# Patient Record
Sex: Male | Born: 1945 | Race: White | Hispanic: No | Marital: Married | Smoking: Never smoker
Health system: Southern US, Community
[De-identification: ages and names within clinical notes are randomized; demographics above are authoritative.]

## PROBLEM LIST (undated history)

## (undated) DIAGNOSIS — K219 Gastro-esophageal reflux disease without esophagitis: Secondary | ICD-10-CM

## (undated) DIAGNOSIS — C8590 Non-Hodgkin lymphoma, unspecified, unspecified site: Secondary | ICD-10-CM

## (undated) DIAGNOSIS — G629 Polyneuropathy, unspecified: Secondary | ICD-10-CM

## (undated) DIAGNOSIS — IMO0001 Reserved for inherently not codable concepts without codable children: Secondary | ICD-10-CM

## (undated) DIAGNOSIS — Z860100 Personal history of colon polyps, unspecified: Secondary | ICD-10-CM

## (undated) DIAGNOSIS — N2889 Other specified disorders of kidney and ureter: Secondary | ICD-10-CM

## (undated) DIAGNOSIS — R112 Nausea with vomiting, unspecified: Secondary | ICD-10-CM

## (undated) DIAGNOSIS — Z8719 Personal history of other diseases of the digestive system: Secondary | ICD-10-CM

## (undated) DIAGNOSIS — Z9889 Other specified postprocedural states: Secondary | ICD-10-CM

## (undated) DIAGNOSIS — D649 Anemia, unspecified: Secondary | ICD-10-CM

## (undated) DIAGNOSIS — C801 Malignant (primary) neoplasm, unspecified: Secondary | ICD-10-CM

## (undated) DIAGNOSIS — N189 Chronic kidney disease, unspecified: Secondary | ICD-10-CM

## (undated) DIAGNOSIS — I2510 Atherosclerotic heart disease of native coronary artery without angina pectoris: Secondary | ICD-10-CM

## (undated) HISTORY — DX: Polyneuropathy, unspecified: G62.9

## (undated) HISTORY — PX: CHOLECYSTECTOMY: SHX55

## (undated) HISTORY — DX: Malignant (primary) neoplasm, unspecified: C80.1

## (undated) HISTORY — PX: APPENDECTOMY: SHX54

## (undated) HISTORY — DX: Gastro-esophageal reflux disease without esophagitis: K21.9

## (undated) HISTORY — PX: FRACTURE SURGERY: SHX138

## (undated) HISTORY — PX: GALLBLADDER SURGERY: SHX652

## (undated) HISTORY — PX: HERNIA REPAIR: SHX51

## (undated) HISTORY — PX: TONSILLECTOMY: SUR1361

## (undated) HISTORY — DX: Non-Hodgkin lymphoma, unspecified, unspecified site: C85.90

## (undated) HISTORY — PX: BONE MARROW TRANSPLANT: SHX200

## (undated) HISTORY — DX: Reserved for inherently not codable concepts without codable children: IMO0001

## (undated) HISTORY — DX: Anemia, unspecified: D64.9

---

## 2004-06-11 ENCOUNTER — Ambulatory Visit: Payer: Self-pay | Admitting: Internal Medicine

## 2008-09-27 ENCOUNTER — Ambulatory Visit: Payer: Self-pay | Admitting: Internal Medicine

## 2008-10-08 ENCOUNTER — Ambulatory Visit: Payer: Self-pay | Admitting: Unknown Physician Specialty

## 2009-11-19 ENCOUNTER — Ambulatory Visit: Payer: Self-pay | Admitting: Unknown Physician Specialty

## 2010-10-27 ENCOUNTER — Ambulatory Visit: Payer: Self-pay | Admitting: Unknown Physician Specialty

## 2010-11-27 ENCOUNTER — Ambulatory Visit: Payer: Self-pay | Admitting: Unknown Physician Specialty

## 2010-12-03 ENCOUNTER — Ambulatory Visit: Payer: Self-pay | Admitting: Unknown Physician Specialty

## 2013-06-14 ENCOUNTER — Ambulatory Visit: Payer: Self-pay | Admitting: General Practice

## 2013-09-04 ENCOUNTER — Ambulatory Visit: Payer: Self-pay | Admitting: Internal Medicine

## 2013-12-14 ENCOUNTER — Encounter: Payer: Self-pay | Admitting: Podiatry

## 2013-12-14 ENCOUNTER — Ambulatory Visit (INDEPENDENT_AMBULATORY_CARE_PROVIDER_SITE_OTHER): Payer: Medicare HMO | Admitting: Podiatry

## 2013-12-14 ENCOUNTER — Ambulatory Visit (INDEPENDENT_AMBULATORY_CARE_PROVIDER_SITE_OTHER): Payer: Medicare HMO

## 2013-12-14 VITALS — BP 121/71 | HR 80 | Resp 16 | Ht 69.0 in | Wt 195.0 lb

## 2013-12-14 DIAGNOSIS — M779 Enthesopathy, unspecified: Secondary | ICD-10-CM

## 2013-12-14 DIAGNOSIS — M2011 Hallux valgus (acquired), right foot: Secondary | ICD-10-CM

## 2013-12-14 DIAGNOSIS — M21611 Bunion of right foot: Secondary | ICD-10-CM

## 2013-12-14 DIAGNOSIS — L84 Corns and callosities: Secondary | ICD-10-CM

## 2013-12-14 MED ORDER — TRIAMCINOLONE ACETONIDE 10 MG/ML IJ SUSP
10.0000 mg | Freq: Once | INTRAMUSCULAR | Status: AC
Start: 2013-12-14 — End: 2013-12-14
  Administered 2013-12-14: 10 mg

## 2013-12-14 NOTE — Progress Notes (Signed)
   Subjective:    Patient ID: Raymond Hock., male    DOB: 1946/03/01, 68 y.o.   MRN: [id number]  HPI Comments: About 5-6 weeks ago my right foot the little toe down to the middle of foot started to hurt. Right 5th met lateral side of foot. Kind of sharp it has me limping .      Review of Systems  Gastrointestinal: Positive for diarrhea.  All other systems reviewed and are negative.      Objective:   Physical Exam        Assessment & Plan:

## 2013-12-14 NOTE — Progress Notes (Signed)
Subjective:     Patient ID: Raymond Bennett., male   DOB: 1945-08-30, 68 y.o.   MRN: [id number]   Foot Pain   patient points the outside of the right foot states it's been hurting in the base of the bone and now has developed a second pain on my right fifth toe which has fluid in it and a lesion that I cannot take care of myself. States he likes to walk and it's been hard to do   Review of Systems  All other systems reviewed and are negative.      Objective:   Physical Exam  Nursing note and vitals reviewed. Constitutional: He is oriented to person, place, and time.  Cardiovascular: Intact distal pulses.   Musculoskeletal: Normal range of motion.  Neurological: He is oriented to person, place, and time.  Skin: Skin is warm.   vascular status found to be intact with neurological diminished both sharp dull and vibratory and muscle strength is adequate with range of motion subtalar and midtarsal joint within normal limits. Patient is noted to have quite a bit of discomfort at the base of the fifth metatarsal right with inflammation and fluid buildup and has a interphalangeal fluid lesion right fifth toe with keratotic lesion over-the-top it's very painful when pressed. Digits are well-perfused and patient is well oriented x3     Assessment:     Hammertoe deformity with inflamed capsule and tendinitis right fifth MPJ base    Plan:     H&P performed and conditions discussed. Careful injection of the tendon base fifth metatarsal 3 mg Kenalog 5 mg Xylocaine was accomplished and I noted the fifth toe and then did a plantar capsule or inflammatory injection  2 mg Kenalog 2 mg Xylocaine and did full debridement of lesion. Dispensed fascially brace with all instructions on usage

## 2013-12-21 ENCOUNTER — Ambulatory Visit (INDEPENDENT_AMBULATORY_CARE_PROVIDER_SITE_OTHER): Payer: Medicare HMO | Admitting: Podiatry

## 2013-12-21 VITALS — BP 112/63 | HR 76 | Resp 16

## 2013-12-21 DIAGNOSIS — M21611 Bunion of right foot: Secondary | ICD-10-CM

## 2013-12-21 DIAGNOSIS — M779 Enthesopathy, unspecified: Secondary | ICD-10-CM

## 2013-12-21 DIAGNOSIS — M2011 Hallux valgus (acquired), right foot: Secondary | ICD-10-CM

## 2013-12-21 NOTE — Progress Notes (Signed)
Subjective:     Patient ID: Raymond Hock., male   DOB: [birth year], 68 y.o.   MRN: [id number]  HPI patient presents stating my foot feels a lot better and I'm having minimal discomfort   Review of Systems     Objective:   Physical Exam Neurovascular status intact with significant diminishment of discomfort base of fifth metatarsal right and also keratotic lesion fifth toe right that has improved with treatment    Assessment:     Improved tendinitis and hammertoe deformity fifth right    Plan:     At this point discussed physical therapy for area debridement of lesion padding and supportive shoes. Reappoint if symptoms were to recur or other issues should occur

## 2014-01-17 DIAGNOSIS — C4491 Basal cell carcinoma of skin, unspecified: Secondary | ICD-10-CM

## 2014-01-17 HISTORY — DX: Basal cell carcinoma of skin, unspecified: C44.91

## 2015-07-25 ENCOUNTER — Other Ambulatory Visit: Payer: Self-pay | Admitting: Internal Medicine

## 2015-07-25 ENCOUNTER — Ambulatory Visit
Admission: RE | Admit: 2015-07-25 | Discharge: 2015-07-25 | Disposition: A | Discharge status: Home / Self Care | Payer: Medicare Other | Source: Ambulatory Visit | Attending: Internal Medicine | Admitting: Internal Medicine

## 2015-07-25 DIAGNOSIS — Z8572 Personal history of non-Hodgkin lymphomas: Secondary | ICD-10-CM

## 2015-07-30 ENCOUNTER — Ambulatory Visit
Admission: RE | Admit: 2015-07-30 | Discharge: 2015-07-30 | Disposition: A | Discharge status: Home / Self Care | Payer: Medicare Other | Source: Ambulatory Visit | Attending: Internal Medicine | Admitting: Internal Medicine

## 2015-07-30 ENCOUNTER — Other Ambulatory Visit
Admission: RE | Admit: 2015-07-30 | Discharge: 2015-07-30 | Disposition: A | Discharge status: Home / Self Care | Payer: Medicare Other | Source: Ambulatory Visit | Attending: Internal Medicine | Admitting: Internal Medicine

## 2015-07-30 DIAGNOSIS — I700 Atherosclerosis of aorta: Secondary | ICD-10-CM | POA: Insufficient documentation

## 2015-07-30 DIAGNOSIS — Z8572 Personal history of non-Hodgkin lymphomas: Secondary | ICD-10-CM | POA: Diagnosis not present

## 2015-07-30 DIAGNOSIS — K7689 Other specified diseases of liver: Secondary | ICD-10-CM | POA: Diagnosis not present

## 2015-07-30 DIAGNOSIS — K760 Fatty (change of) liver, not elsewhere classified: Secondary | ICD-10-CM | POA: Diagnosis not present

## 2015-07-30 DIAGNOSIS — Z01812 Encounter for preprocedural laboratory examination: Secondary | ICD-10-CM | POA: Diagnosis present

## 2015-07-30 LAB — CREATININE, SERUM
Creatinine, Ser: 1.3 mg/dL — ABNORMAL HIGH (ref 0.61–1.24)
GFR, EST NON AFRICAN AMERICAN: 54 mL/min — AB (ref >60)

## 2015-07-30 MED ORDER — IOPAMIDOL (ISOVUE-300) INJECTION 61%
100.0000 mL | Freq: Once | INTRAVENOUS | Status: AC | PRN
  Administered 2015-07-30: 85 mL via INTRAVENOUS

## 2015-08-06 ENCOUNTER — Encounter: Payer: Self-pay | Admitting: Urology

## 2015-08-06 ENCOUNTER — Ambulatory Visit (INDEPENDENT_AMBULATORY_CARE_PROVIDER_SITE_OTHER): Payer: Medicare Other | Admitting: Urology

## 2015-08-06 VITALS — BP 116/72 | HR 77 | Ht 68.0 in | Wt 203.4 lb

## 2015-08-06 DIAGNOSIS — N2889 Other specified disorders of kidney and ureter: Secondary | ICD-10-CM | POA: Diagnosis not present

## 2015-08-06 NOTE — Progress Notes (Signed)
08/06/2015 11:44 AM   Raymond Bennett 01/17/1946 [id number]  Referring provider: Albina Billet, MD [address]   [place], [place] [address]  Chief Complaint  Patient presents with  . New Patient (Initial Visit)    CT scan     HPI: The patient is a 70 year old gentleman with a past medical history of lymphoma who presents for evaluation of a renal mass.  It is located on the left lower pole of his kidney. His 14 mm in size. Compared to previous imaging, it was 6 mm in 2012. The patient has no urological plates this time. He is no history of gross hematuria or difficulty with urination.  Regards to his lymphoma history, he was diagnosed in 1993 and received chemotherapy at that time. He had recurrence in 1995 and underwent a stem cell transplant at [facility]. He's had no evidence of disease since that time.   PMH: Past Medical History  Diagnosis Date  . Reflux   . Neuropathy ([place])   . Cancer ([place])   . Non Hodgkin's lymphoma [facility])     Surgical History: Past Surgical History  Procedure Laterality Date  . Gallbladder surgery    . Fracture surgery    . Bone marrow transplant      Home Medications:    Medication List       This list is accurate as of: 08/06/15 11:44 AM.  Always use your most recent med list.               cholecalciferol 1000 units tablet  Commonly known as:  VITAMIN D  Take 1,000 Units by mouth daily.     FLUOCINOLONE ACETONIDE BODY 0.01 % Oil     magnesium oxide 400 MG tablet  Commonly known as:  MAG-OX  Take 400 mg by mouth daily.     omeprazole 40 MG capsule  Commonly known as:  PRILOSEC  Reported on 08/06/2015     PROBIOTIC + OMEGA-3 Caps  Take by mouth.     triamcinolone cream 0.1 %  Commonly known as:  KENALOG     vitamin B-12 1000 MCG tablet  Commonly known as:  CYANOCOBALAMIN  Take 1,000 mcg by mouth daily.        Allergies: No Known Allergies  Family History: No family history on file.  Social History:  reports that  he has never smoked. He has never used smokeless tobacco. His alcohol and drug histories are not on file.  ROS: UROLOGY Frequent Urination?: No Hard to postpone urination?: No Burning/pain with urination?: No Get up at night to urinate?: Yes Leakage of urine?: No Urine stream starts and stops?: No Trouble starting stream?: No Do you have to strain to urinate?: No Blood in urine?: No Urinary tract infection?: No Sexually transmitted disease?: No Injury to kidneys or bladder?: No Painful intercourse?: No Weak stream?: No Erection problems?: No Penile pain?: No  Gastrointestinal Nausea?: No Vomiting?: No Indigestion/heartburn?: No Diarrhea?: No Constipation?: No  Constitutional Fever: No Night sweats?: No Weight loss?: No Fatigue?: No  Skin Skin rash/lesions?: Yes Itching?: Yes  Eyes Blurred vision?: No Double vision?: No  Ears/Nose/Throat Sore throat?: No Sinus problems?: No  Hematologic/Lymphatic Swollen glands?: No Easy bruising?: No  Cardiovascular Leg swelling?: No Chest pain?: No  Respiratory Cough?: No Shortness of breath?: No  Endocrine Excessive thirst?: No  Musculoskeletal Back pain?: No Joint pain?: No  Neurological Headaches?: No Dizziness?: No  Psychologic Depression?: No Anxiety?: No  Physical Exam:  BP 116/72 mmHg  Pulse 77  Ht 5\' 8"  (1.727 m)  Wt 203 lb 6.4 oz (92.262 kg)  BMI 30.93 kg/m2  Constitutional:  Alert and oriented, No acute distress. HEENT: [place] AT, moist mucus membranes.  Trachea midline, no masses. Cardiovascular: No clubbing, cyanosis, or edema. Respiratory: Normal respiratory effort, no increased work of breathing. GI: Abdomen is soft, nontender, nondistended, no abdominal masses GU: No CVA tenderness.  Skin: No rashes, bruises or suspicious lesions. Lymph: No cervical or inguinal adenopathy. Neurologic: Grossly intact, no focal deficits, moving all 4 extremities. Psychiatric: Normal mood and  affect.  Laboratory Data: No results found for: WBC, HGB, HCT, MCV, PLT  Lab Results  Component Value Date   CREATININE 1.30* 07/30/2015    No results found for: PSA  No results found for: TESTOSTERONE  No results found for: HGBA1C  Urinalysis No results found for: COLORURINE, APPEARANCEUR, LABSPEC, PHURINE, GLUCOSEU, HGBUR, BILIRUBINUR, KETONESUR, PROTEINUR, UROBILINOGEN, NITRITE, LEUKOCYTESUR  Pertinent Imaging: CLINICAL DATA: Skin rashes since February 2016. Personal history of non-Hodgkin's lymphoma 20 years ago.  EXAM: CT ABDOMEN AND PELVIS WITH CONTRAST  TECHNIQUE: Multidetector CT imaging of the abdomen and pelvis was performed using the standard protocol following bolus administration of intravenous contrast.  CONTRAST: 40mL ISOVUE-300 IOPAMIDOL (ISOVUE-300) INJECTION 61%  COMPARISON: 12/03/2010  FINDINGS: Lung bases are clear. No pleural or pericardial fluid.  There is diffuse fatty change of the liver. 1 cm cyst at the inferior margin of the lateral segment of the left lobe is unchanged. Previous cholecystectomy. The spleen is normal. The pancreas is normal. The adrenal glands are normal. The right kidney is normal. The left kidney is normal except for a 6 mm cyst in the upper pole and a 14 mm abnormality at the lower pole. This can be seen in retrospect in 2012 when it measured 6 mm. This is indeterminate but could be a neoplasm. The aorta and its branch vessels show atherosclerosis. No aneurysm. The IVC is normal. No retroperitoneal mass or lymphadenopathy. No free intraperitoneal fluid or air. Previous abdominal wall surgery. Bladder, prostate gland and seminal vesicles are unremarkable. No bowel pathology is seen. There are ordinary degenerative changes of the lower lumbar spine.  IMPRESSION: No evidence of recurrent lymphoma.  Chronic fatty change of the liver. 1 cm cyst in the left lobe.  Atherosclerosis of the aorta and its  branch vessels.  14 mm abnormality at the lower pole of the kidney on the left which could represent a mass. Because this is enlarging since the study of 2012, where it is visible only in retrospect as a 6 mm focus, MRI with and without contrast is recommended.   Assessment & Plan:    1. 14 mm left renal mass I discussed the patient that his current CT with contrast is indeterminate as to whether or not his renal masses and malignancy. We did discuss that if it is a malignancy, it is growing quite slowly from when it was first seen in 2012. He also has a history of lymphoma, so this cannot be ruled out as the etiology either. We discussed the next best step would be to obtain an MRI Renal Protocol in order to better visualize this mass. We'll plan to proceed with the MRI. We will follow-up after the study has been performed to discuss the results.  Return in about 2 weeks (around 08/20/2015) for after MRI .  [name], MD  [facility] [address], [place], [place] [address] [phone]   

## 2015-08-26 ENCOUNTER — Ambulatory Visit
Admission: RE | Admit: 2015-08-26 | Discharge: 2015-08-26 | Disposition: A | Discharge status: Home / Self Care | Payer: Medicare Other | Source: Ambulatory Visit | Attending: Urology | Admitting: Urology

## 2015-08-26 DIAGNOSIS — K760 Fatty (change of) liver, not elsewhere classified: Secondary | ICD-10-CM | POA: Insufficient documentation

## 2015-08-26 DIAGNOSIS — N2889 Other specified disorders of kidney and ureter: Secondary | ICD-10-CM | POA: Insufficient documentation

## 2015-08-26 MED ORDER — GADOBENATE DIMEGLUMINE 529 MG/ML IV SOLN
20.0000 mL | Freq: Once | INTRAVENOUS | Status: AC | PRN
  Administered 2015-08-26: 19 mL via INTRAVENOUS

## 2015-09-03 ENCOUNTER — Ambulatory Visit: Payer: Medicare Other

## 2015-09-08 ENCOUNTER — Ambulatory Visit: Payer: Medicare Other

## 2015-09-10 ENCOUNTER — Ambulatory Visit: Payer: Medicare Other | Admitting: Urology

## 2015-10-02 ENCOUNTER — Ambulatory Visit (INDEPENDENT_AMBULATORY_CARE_PROVIDER_SITE_OTHER): Payer: Medicare Other | Admitting: Urology

## 2015-10-02 ENCOUNTER — Encounter: Payer: Self-pay | Admitting: Urology

## 2015-10-02 VITALS — BP 110/75 | HR 92 | Ht 68.0 in | Wt 201.6 lb

## 2015-10-02 DIAGNOSIS — N2889 Other specified disorders of kidney and ureter: Secondary | ICD-10-CM

## 2015-10-02 NOTE — Progress Notes (Signed)
10/02/2015 10:21 AM   Raymond Bennett 1945-08-11 [id number]  Referring provider: Albina Billet, MD [address]   [place], [place] [address]  Chief Complaint  Patient presents with  . Follow-up    MRI results    HPI: The patient is a 70 year old male who presents for follow-up of his MRI. Unfortunately shows a 1 cm left lower pole mass consistent with renal cell carcinoma. He denies any other changes to his history since this was seen.  Of note, he has a history of lymphoma that has been successfully treated with bone marrow transplant. He also has had a bilateral hernias, cholecystectomy, and a umbilical hernia repair that was performed open.   PMH: Past Medical History:  Diagnosis Date  . Cancer ([place])   . Neuropathy ([place])   . Non Hodgkin's lymphoma ([place])   . Reflux     Surgical History: Past Surgical History:  Procedure Laterality Date  . BONE MARROW TRANSPLANT    . FRACTURE SURGERY    . GALLBLADDER SURGERY      Home Medications:    Medication List       Accurate as of 10/02/15 10:21 AM. Always use your most recent med list.          cholecalciferol 1000 units tablet Commonly known as:  VITAMIN D Take 1,000 Units by mouth daily.   FLUOCINOLONE ACETONIDE BODY 0.01 % Oil   magnesium oxide 400 MG tablet Commonly known as:  MAG-OX Take 400 mg by mouth daily.   omeprazole 40 MG capsule Commonly known as:  PRILOSEC Reported on 08/06/2015   PROBIOTIC + OMEGA-3 Caps Take by mouth.   triamcinolone cream 0.1 % Commonly known as:  KENALOG   vitamin B-12 1000 MCG tablet Commonly known as:  CYANOCOBALAMIN Take 1,000 mcg by mouth daily.       Allergies: No Known Allergies  Family History: No family history on file.  Social History:  reports that he has never smoked. He has never used smokeless tobacco. His alcohol and drug histories are not on file.  ROS: UROLOGY Frequent Urination?: No Hard to postpone urination?: No Burning/pain with  urination?: No Get up at night to urinate?: No Leakage of urine?: No Urine stream starts and stops?: No Trouble starting stream?: No Do you have to strain to urinate?: No Blood in urine?: No Urinary tract infection?: No Sexually transmitted disease?: No Injury to kidneys or bladder?: No Painful intercourse?: No Weak stream?: No Erection problems?: No Penile pain?: No  Gastrointestinal Nausea?: No Vomiting?: No Indigestion/heartburn?: No Diarrhea?: No Constipation?: No  Constitutional Fever: No Night sweats?: No Weight loss?: No Fatigue?: No  Skin Skin rash/lesions?: Yes Itching?: Yes  Eyes Blurred vision?: No Double vision?: No  Ears/Nose/Throat Sore throat?: No Sinus problems?: No  Hematologic/Lymphatic Swollen glands?: No Easy bruising?: No  Cardiovascular Leg swelling?: No Chest pain?: No  Respiratory Cough?: No Shortness of breath?: No  Endocrine Excessive thirst?: No  Musculoskeletal Back pain?: No Joint pain?: No  Neurological Headaches?: No Dizziness?: No  Psychologic Depression?: No Anxiety?: No  Physical Exam: BP 110/75 (BP Location: Left Arm, Patient Position: Sitting, Cuff Size: Large)   Pulse 92   Ht 5\' 8"  (1.727 m)   Wt 201 lb 9.6 oz (91.4 kg)   BMI 30.65 kg/m   Constitutional:  Alert and oriented, No acute distress. HEENT: [place] AT, moist mucus membranes.  Trachea midline, no masses. Cardiovascular: No clubbing, cyanosis, or edema. Respiratory: Normal respiratory effort,  no increased work of breathing. GI: Abdomen is soft, nontender, nondistended, no abdominal masses GU: No CVA tenderness.  Skin: No rashes, bruises or suspicious lesions. Lymph: No cervical or inguinal adenopathy. Neurologic: Grossly intact, no focal deficits, moving all 4 extremities. Psychiatric: Normal mood and affect.  Laboratory Data: No results found for: WBC, HGB, HCT, MCV, PLT  Lab Results  Component Value Date   CREATININE 1.30 (H) 07/30/2015     No results found for: PSA  No results found for: TESTOSTERONE  No results found for: HGBA1C  Urinalysis No results found for: COLORURINE, APPEARANCEUR, LABSPEC, PHURINE, GLUCOSEU, HGBUR, BILIRUBINUR, KETONESUR, PROTEINUR, UROBILINOGEN, NITRITE, LEUKOCYTESUR  Pertinent Imaging: CLINICAL DATA:  Indeterminate lower left renal mass on recent CT study.  EXAM: MRI ABDOMEN WITHOUT AND WITH CONTRAST  TECHNIQUE: Multiplanar multisequence MR imaging of the abdomen was performed both before and after the administration of intravenous contrast.  CONTRAST:  3mL MULTIHANCE GADOBENATE DIMEGLUMINE 529 MG/ML IV SOLN  COMPARISON:  07/30/2015 CT abdomen/ pelvis.  FINDINGS: Lower chest: Clear lung bases.  Hepatobiliary: Normal liver size and configuration. Moderate diffuse hepatic steatosis. No definite liver surface irregularity. Too small simple liver cysts, largest 1.2 cm in the segment 3 left liver lobe. Cholecystectomy. No intrahepatic biliary ductal dilatation. Common bile duct diameter 6 mm, within expected post cholecystectomy limits. No choledocholithiasis. No biliary stricture. No biliary or ampullary mass.  Pancreas: No pancreatic mass or duct dilation.  No pancreas divisum.  Spleen: Normal size. No mass.  Adrenals/Urinary Tract: Normal adrenals. No hydronephrosis. Simple 0.8 cm renal cyst in the lateral upper left kidney. There is an avidly enhancing 1.3 x 1.3 x 1.5 cm renal cortical mass in the medial lower left kidney (series 12/image 40), which was not present on the 09/27/2008 CT study and which increased in retrospect from 0.7 x 0.7 cm on 12/03/2010 CT study, most consistent with renal cell carcinoma.  Stomach/Bowel: Grossly normal stomach. Visualized small and large bowel is normal caliber, with no bowel wall thickening.  Vascular/Lymphatic: Normal caliber abdominal aorta. Patent portal, splenic, hepatic and renal veins. No pathologically enlarged  lymph nodes in the abdomen.  Other: No abdominal ascites or focal fluid collection.  Musculoskeletal: No aggressive appearing focal osseous lesions.  IMPRESSION: 1. Avidly enhancing 1.3 x 1.3 x 1.5 cm renal cortical mass in the medial lower left kidney, most consistent with renal cell carcinoma. Patent renal veins without tumor thrombus. 2. No lymphadenopathy or other findings of metastatic disease in the abdomen. 3. Moderate diffuse hepatic steatosis.  Assessment & Plan:    1. Renal mass I had a long discussion with those approximately 45 minutes with the patient regarding treatment options. These include active surveillance, partial nephrectomy robotically, and cryoablation. We specifically spent most the time discussing robotic partial nephrectomy. He understands the goal would be to remove just the tumor. He understands this may be somewhat difficult given his multiple previous abdominal surgeries. He understands the risks also include but are not limited to bleeding, infection, having to perform a radical nephrectomy, severe blood loss, damage to surrounding structures, ureteral injury, DVT, anesthetic complications, and death. He understands these risks. He also understands that he will the Foley catheter draining surgery. In his wife elected to discuss procedure amongst themselves. He is undergoing a robotic partial left nephrectomy. He'll follow-up in 2-3 weeks to discuss this further.   Return in about 3 weeks (around 10/23/2015).  [name], MD  [facility] [address], [place], [place] [address] [phone]  

## 2015-10-07 ENCOUNTER — Telehealth: Payer: Self-pay | Admitting: Radiology

## 2015-10-07 NOTE — Telephone Encounter (Signed)
Notified pt of surgery scheduled with Dr Pilar Jarvis & Dr Erlene Quan on 11/12/15, pre-admit testing appt on 10/30/15 @10 :00 & to call day prior to surgery for arrival time to SDS. Pt voices understanding.

## 2015-10-30 ENCOUNTER — Encounter
Admission: RE | Admit: 2015-10-30 | Discharge: 2015-10-30 | Disposition: A | Discharge status: Home / Self Care | Payer: Medicare Other | Source: Ambulatory Visit | Attending: Urology | Admitting: Urology

## 2015-10-30 ENCOUNTER — Other Ambulatory Visit: Payer: Self-pay | Admitting: Radiology

## 2015-10-30 ENCOUNTER — Encounter: Payer: Self-pay | Admitting: Urology

## 2015-10-30 ENCOUNTER — Ambulatory Visit (INDEPENDENT_AMBULATORY_CARE_PROVIDER_SITE_OTHER): Payer: Medicare Other | Admitting: Urology

## 2015-10-30 VITALS — BP 118/78 | HR 114 | Ht 68.0 in | Wt 201.0 lb

## 2015-10-30 DIAGNOSIS — C649 Malignant neoplasm of unspecified kidney, except renal pelvis: Secondary | ICD-10-CM | POA: Diagnosis not present

## 2015-10-30 DIAGNOSIS — Z01810 Encounter for preprocedural cardiovascular examination: Secondary | ICD-10-CM | POA: Insufficient documentation

## 2015-10-30 DIAGNOSIS — N2889 Other specified disorders of kidney and ureter: Secondary | ICD-10-CM

## 2015-10-30 DIAGNOSIS — Z01812 Encounter for preprocedural laboratory examination: Secondary | ICD-10-CM | POA: Insufficient documentation

## 2015-10-30 HISTORY — DX: Nausea with vomiting, unspecified: Z98.890

## 2015-10-30 HISTORY — DX: Chronic kidney disease, unspecified: N18.9

## 2015-10-30 HISTORY — DX: Personal history of other diseases of the digestive system: Z87.19

## 2015-10-30 HISTORY — DX: Nausea with vomiting, unspecified: R11.2

## 2015-10-30 HISTORY — DX: Gastro-esophageal reflux disease without esophagitis: K21.9

## 2015-10-30 LAB — BASIC METABOLIC PANEL WITH GFR
Anion gap: 4 — ABNORMAL LOW (ref 5–15)
BUN: 17 mg/dL (ref 6–20)
CO2: 32 mmol/L (ref 22–32)
Calcium: 9.6 mg/dL (ref 8.9–10.3)
Chloride: 104 mmol/L (ref 101–111)
Creatinine, Ser: 1.2 mg/dL (ref 0.61–1.24)
GFR calc Af Amer: >60 mL/min
GFR calc non Af Amer: 59 mL/min — ABNORMAL LOW
Glucose, Bld: 79 mg/dL (ref 65–99)
Potassium: 3.7 mmol/L (ref 3.5–5.1)
Sodium: 140 mmol/L (ref 135–145)

## 2015-10-30 LAB — CBC
HEMATOCRIT: 41.8 % (ref 40.0–52.0)
HEMOGLOBIN: 14.5 g/dL (ref 13.0–18.0)
MCH: 34.1 pg — AB (ref 26.0–34.0)
MCHC: 34.7 g/dL (ref 32.0–36.0)
MCV: 98.4 fL (ref 80.0–100.0)
Platelets: 185 10*3/uL (ref 150–440)
RBC: 4.25 MIL/uL — AB (ref 4.40–5.90)
RDW: 15.5 % — ABNORMAL HIGH (ref 11.5–14.5)
WBC: 11.2 10*3/uL — ABNORMAL HIGH (ref 3.8–10.6)

## 2015-10-30 LAB — PROTIME-INR
INR: 0.99
PROTHROMBIN TIME: 13.1 s (ref 11.4–15.2)

## 2015-10-30 LAB — APTT: aPTT: 30 s (ref 24–36)

## 2015-10-30 NOTE — Progress Notes (Signed)
10/30/2015 9:38 AM   Raymond Bennett [birth year] [id number]  Referring provider: Albina Billet, MD [address]   [place], [place] [address]  Chief Complaint  Patient presents with  . Pre-op Exam    Renal Mass    HPI: The patient is here to further discuss his 1 cm lower pole left renal mass is concerning for RCC. He presents today for further discussions regarding surgical management. He has no new complaints at this time.   PMH: Past Medical History:  Diagnosis Date  . Cancer ([place])   . Neuropathy ([place])   . Non Hodgkin's lymphoma ([place])   . Reflux     Surgical History: Past Surgical History:  Procedure Laterality Date  . BONE MARROW TRANSPLANT    . FRACTURE SURGERY    . GALLBLADDER SURGERY      Home Medications:    Medication List       Accurate as of 10/30/15  9:38 AM. Always use your most recent med list.          cholecalciferol 1000 units tablet Commonly known as:  VITAMIN D Take 1,000 Units by mouth daily.   FLUOCINOLONE ACETONIDE BODY 0.01 % Oil   IRON-150 PO Take by mouth.   magnesium oxide 400 MG tablet Commonly known as:  MAG-OX Take 400 mg by mouth daily.   omeprazole 40 MG capsule Commonly known as:  PRILOSEC Reported on 08/06/2015   PROBIOTIC + OMEGA-3 Caps Take by mouth.   triamcinolone cream 0.1 % Commonly known as:  KENALOG   vitamin B-12 1000 MCG tablet Commonly known as:  CYANOCOBALAMIN Take 1,000 mcg by mouth daily.       Allergies: No Known Allergies  Family History: History reviewed. No pertinent family history.  Social History:  reports that he has never smoked. He has never used smokeless tobacco. His alcohol and drug histories are not on file.  ROS: UROLOGY Frequent Urination?: No Hard to postpone urination?: No Burning/pain with urination?: No Get up at night to urinate?: No Leakage of urine?: No Urine stream starts and stops?: No Trouble starting stream?: No Do you have to strain to urinate?:  No Blood in urine?: No Urinary tract infection?: No Sexually transmitted disease?: No Injury to kidneys or bladder?: No Painful intercourse?: No Weak stream?: No Erection problems?: No Penile pain?: No  Gastrointestinal Nausea?: No Vomiting?: No Indigestion/heartburn?: No Diarrhea?: No Constipation?: No  Constitutional Fever: No Night sweats?: No Weight loss?: No Fatigue?: No  Skin Skin rash/lesions?: No Itching?: Yes  Eyes Blurred vision?: No Double vision?: No  Ears/Nose/Throat Sore throat?: No Sinus problems?: No  Hematologic/Lymphatic Swollen glands?: No Easy bruising?: No  Cardiovascular Leg swelling?: No Chest pain?: No  Respiratory Cough?: No Shortness of breath?: No  Endocrine Excessive thirst?: No  Musculoskeletal Back pain?: No Joint pain?: No  Neurological Headaches?: No Dizziness?: No  Psychologic Depression?: No Anxiety?: No  Physical Exam: BP 118/78   Pulse (!) 114   Ht 5\' 8"  (1.727 m)   Wt 201 lb (91.2 kg)   BMI 30.56 kg/m   Constitutional:  Alert and oriented, No acute distress. HEENT:  AT, moist mucus membranes.  Trachea midline, no masses. Cardiovascular: No clubbing, cyanosis, or edema. Respiratory: Normal respiratory effort, no increased work of breathing. GI: Abdomen is soft, nontender, nondistended, no abdominal masses GU: No CVA tenderness.  Skin: No rashes, bruises or suspicious lesions. Lymph: No cervical or inguinal adenopathy. Neurologic: Grossly intact, no focal deficits, moving  all 4 extremities. Psychiatric: Normal mood and affect.  Laboratory Data: No results found for: WBC, HGB, HCT, MCV, PLT  Lab Results  Component Value Date   CREATININE 1.30 (H) 07/30/2015    No results found for: PSA  No results found for: TESTOSTERONE  No results found for: HGBA1C  Urinalysis No results found for: COLORURINE, APPEARANCEUR, LABSPEC, PHURINE, GLUCOSEU, HGBUR, BILIRUBINUR, KETONESUR, PROTEINUR,  UROBILINOGEN, NITRITE, LEUKOCYTESUR   Assessment & Plan:    1. Left renal mass I again had a long discussion with those approximately 45 minutes with the patient regarding treatment options. These include active surveillance, partial nephrectomy robotically, and cryoablation. The patient has decided on a left robotic partial nephrectomy. He understands the goal would be to remove just the tumor. He understands this may be somewhat difficult given his multiple previous abdominal surgeries. He understands the risks also include but are not limited to bleeding, infection, having to perform a radical nephrectomy, severe blood loss, damage to surrounding structures, ureteral injury, DVT, anesthetic complications, and death. He understands these risks. He also understands that he will the Foley catheter draining surgery. He has elected to proceed with surgery.    [name], MD  [facility] [address], [place], [place] [address] [phone]

## 2015-10-30 NOTE — Patient Instructions (Signed)
  Your procedure is scheduled on: November 12, 2015 (Wednesday) Report to Same Day Surgery 2nd floor [facility] To find out your arrival time please call [phone] between 1PM - 3PM on November 11, 2015 (Tuesday)  Remember: Instructions that are not followed completely may result in serious medical risk, up to and including death, or upon the discretion of your surgeon and anesthesiologist your surgery may need to be rescheduled.    _x___ 1. Do not eat food or drink liquids after midnight. No gum chewing or hard candies.     __x__ 2. No Alcohol for 24 hours before or after surgery.   __x__3. No Smoking for 24 prior to surgery.   ____  4. Bring all medications with you on the day of surgery if instructed.    __x__ 5. Notify your doctor if there is any change in your medical condition     (cold, fever, infections).     Do not wear jewelry, make-up, hairpins, clips or nail polish.  Do not wear lotions, powders, or perfumes. You may wear deodorant.  Do not shave 48 hours prior to surgery. Men may shave face and neck.  Do not bring valuables to the hospital.    [facility] is not responsible for any belongings or valuables.               Contacts, dentures or bridgework may not be worn into surgery.  Leave your suitcase in the car. After surgery it may be brought to your room.  For patients admitted to the hospital, discharge time is determined by your treatment team.   Patients discharged the day of surgery will not be allowed to drive home.    Please read over the following fact sheets that you were given:   [facility] Preparing for Surgery and or MRSA Information   _x___ Take these medicines the morning of surgery with A SIP OF WATER:    1. Omeprazole (Omeprazole at bedtime on Tuesday night)    ____ Fleet Enema (as directed)   _x___ Use CHG Soap or sage wipes as directed on instruction sheet   ____ Use inhalers on the day of surgery and bring to hospital day of  surgery  ____ Stop metformin 2 days prior to surgery    ____ Take 1/2 of usual insulin dose the night before surgery and none on the morning of  surgery         _x___ Stop aspirin or coumadin, or plavix (NO ASPIRIN)  _x__ Stop Anti-inflammatories such as Advil, Aleve, Ibuprofen, Motrin, Naproxen,BC,          Naprosyn, Goodies powders or aspirin products. Ok to take Tylenol.   __x__ Stop supplements until after surgery.  (Stop Vitamin B, and Probiotic now)  ____ Bring C-Pap to the hospital.

## 2015-10-31 ENCOUNTER — Other Ambulatory Visit: Payer: Self-pay | Admitting: Radiology

## 2015-11-02 LAB — CULTURE, URINE COMPREHENSIVE

## 2015-11-03 NOTE — Pre-Procedure Instructions (Signed)
EKG reviewed by Dr. Amie Critchley and "ok for surgery."

## 2015-11-12 ENCOUNTER — Inpatient Hospital Stay: Payer: Medicare Other | Admitting: Anesthesiology

## 2015-11-12 ENCOUNTER — Inpatient Hospital Stay
Admission: RE | Admit: 2015-11-12 | Discharge: 2015-11-14 | DRG: 661 | Disposition: A | Discharge status: Home / Self Care | Payer: Medicare Other | Source: Ambulatory Visit | Attending: Urology | Admitting: Urology

## 2015-11-12 ENCOUNTER — Encounter
Admission: RE | Disposition: A | Discharge status: Home / Self Care | Payer: Self-pay | Source: Ambulatory Visit | Attending: Urology

## 2015-11-12 DIAGNOSIS — Z9889 Other specified postprocedural states: Secondary | ICD-10-CM

## 2015-11-12 DIAGNOSIS — K219 Gastro-esophageal reflux disease without esophagitis: Secondary | ICD-10-CM | POA: Diagnosis present

## 2015-11-12 DIAGNOSIS — N2889 Other specified disorders of kidney and ureter: Secondary | ICD-10-CM | POA: Diagnosis present

## 2015-11-12 DIAGNOSIS — Z8572 Personal history of non-Hodgkin lymphomas: Secondary | ICD-10-CM | POA: Diagnosis present

## 2015-11-12 DIAGNOSIS — D4102 Neoplasm of uncertain behavior of left kidney: Secondary | ICD-10-CM

## 2015-11-12 HISTORY — PX: ROBOTIC ASSITED PARTIAL NEPHRECTOMY: SHX6087

## 2015-11-12 LAB — ABO/RH: ABO/RH(D): O NEG

## 2015-11-12 SURGERY — ROBOTIC ASSITED PARTIAL NEPHRECTOMY
Anesthesia: General | Site: Flank | Laterality: Left | Wound class: Clean Contaminated

## 2015-11-12 MED ORDER — FENTANYL CITRATE (PF) 100 MCG/2ML IJ SOLN
INTRAMUSCULAR | Status: DC | PRN
  Administered 2015-11-12: 50 ug via INTRAVENOUS
  Administered 2015-11-12 (×2): 100 ug via INTRAVENOUS
  Administered 2015-11-12: 50 ug via INTRAVENOUS

## 2015-11-12 MED ORDER — ONDANSETRON HCL 4 MG/2ML IJ SOLN: 4.0000 mg | INTRAMUSCULAR | Status: DC | PRN

## 2015-11-12 MED ORDER — PROMETHAZINE HCL 25 MG/ML IJ SOLN: 6.2500 mg | INTRAMUSCULAR | Status: DC | PRN

## 2015-11-12 MED ORDER — OXYCODONE HCL 5 MG/5ML PO SOLN: 5.0000 mg | Freq: Once | ORAL | Status: DC | PRN

## 2015-11-12 MED ORDER — FENTANYL CITRATE (PF) 100 MCG/2ML IJ SOLN
INTRAMUSCULAR | Status: AC
  Filled 2015-11-12: qty 2

## 2015-11-12 MED ORDER — MANNITOL 25 % IV SOLN
INTRAVENOUS | Status: AC
  Filled 2015-11-12: qty 50

## 2015-11-12 MED ORDER — MEPERIDINE HCL 25 MG/ML IJ SOLN: 6.2500 mg | INTRAMUSCULAR | Status: DC | PRN

## 2015-11-12 MED ORDER — HYDROCODONE-ACETAMINOPHEN 5-325 MG PO TABS
1.0000 | ORAL_TABLET | ORAL | Status: DC | PRN
  Administered 2015-11-12 – 2015-11-13 (×4): 1 via ORAL
  Administered 2015-11-13: 2 via ORAL
  Administered 2015-11-14 (×2): 1 via ORAL
  Filled 2015-11-12: qty 2
  Filled 2015-11-12 (×6): qty 1

## 2015-11-12 MED ORDER — FENTANYL CITRATE (PF) 100 MCG/2ML IJ SOLN
25.0000 ug | INTRAMUSCULAR | Status: DC | PRN
  Administered 2015-11-12 (×2): 50 ug via INTRAVENOUS

## 2015-11-12 MED ORDER — TRIAMCINOLONE ACETONIDE 0.1 % EX CREA: 1.0000 "application " | TOPICAL_CREAM | CUTANEOUS | Status: DC | PRN

## 2015-11-12 MED ORDER — PROPOFOL 10 MG/ML IV BOLUS
INTRAVENOUS | Status: DC | PRN
  Administered 2015-11-12: 180 mg via INTRAVENOUS

## 2015-11-12 MED ORDER — SODIUM CHLORIDE FLUSH 0.9 % IV SOLN
INTRAVENOUS | Status: AC
  Filled 2015-11-12: qty 3

## 2015-11-12 MED ORDER — OXYCODONE HCL 5 MG PO TABS: 5.0000 mg | ORAL_TABLET | Freq: Once | ORAL | Status: DC | PRN

## 2015-11-12 MED ORDER — BUPIVACAINE HCL 0.5 % IJ SOLN
INTRAMUSCULAR | Status: DC | PRN
  Administered 2015-11-12: 7 mL

## 2015-11-12 MED ORDER — FENTANYL CITRATE (PF) 100 MCG/2ML IJ SOLN
25.0000 ug | INTRAMUSCULAR | Status: DC | PRN
  Administered 2015-11-12: 50 ug via INTRAVENOUS

## 2015-11-12 MED ORDER — SCOPOLAMINE 1 MG/3DAYS TD PT72
MEDICATED_PATCH | TRANSDERMAL | Status: AC
  Filled 2015-11-12: qty 1

## 2015-11-12 MED ORDER — LACTATED RINGERS IV SOLN
INTRAVENOUS | Status: DC
Start: 2015-11-12 — End: 2015-11-12

## 2015-11-12 MED ORDER — SODIUM CHLORIDE 0.9 % IV SOLN
INTRAVENOUS | Status: DC
  Administered 2015-11-12 – 2015-11-14 (×4): via INTRAVENOUS

## 2015-11-12 MED ORDER — LACTATED RINGERS IV SOLN
INTRAVENOUS | Status: DC
  Administered 2015-11-12 (×2): via INTRAVENOUS

## 2015-11-12 MED ORDER — THROMBIN 5000 UNITS EX SOLR
CUTANEOUS | Status: AC
  Filled 2015-11-12: qty 5000

## 2015-11-12 MED ORDER — MIDAZOLAM HCL 2 MG/2ML IJ SOLN
INTRAMUSCULAR | Status: DC | PRN
  Administered 2015-11-12: 1 mg via INTRAVENOUS

## 2015-11-12 MED ORDER — MORPHINE SULFATE (PF) 2 MG/ML IV SOLN: 2.0000 mg | INTRAVENOUS | Status: DC | PRN

## 2015-11-12 MED ORDER — DOCUSATE SODIUM 100 MG PO CAPS
100.0000 mg | ORAL_CAPSULE | Freq: Two times a day (BID) | ORAL | Status: DC
  Administered 2015-11-12 – 2015-11-13 (×3): 100 mg via ORAL
  Filled 2015-11-12 (×3): qty 1

## 2015-11-12 MED ORDER — SUCCINYLCHOLINE CHLORIDE 20 MG/ML IJ SOLN
INTRAMUSCULAR | Status: DC | PRN
  Administered 2015-11-12: 100 mg via INTRAVENOUS

## 2015-11-12 MED ORDER — MANNITOL 25 % IV SOLN
INTRAVENOUS | Status: DC | PRN
  Administered 2015-11-12: 12.5 g via INTRAVENOUS

## 2015-11-12 MED ORDER — EVICEL 5 ML EX KIT
PACK | CUTANEOUS | Status: DC | PRN
  Administered 2015-11-12: 5 mL via TOPICAL

## 2015-11-12 MED ORDER — NEOSTIGMINE METHYLSULFATE 10 MG/10ML IV SOLN
INTRAVENOUS | Status: DC | PRN
Start: 2015-11-12 — End: 2015-11-12
  Administered 2015-11-12: 5 mg via INTRAVENOUS

## 2015-11-12 MED ORDER — CEFAZOLIN SODIUM-DEXTROSE 2-4 GM/100ML-% IV SOLN
INTRAVENOUS | Status: AC
  Administered 2015-11-13: 2 g via INTRAVENOUS
  Filled 2015-11-12: qty 100

## 2015-11-12 MED ORDER — SODIUM CHLORIDE 0.9 % IV SOLN
INTRAVENOUS | Status: DC | PRN
  Administered 2015-11-12: 08:00:00 via INTRAVENOUS

## 2015-11-12 MED ORDER — BUPIVACAINE HCL (PF) 0.5 % IJ SOLN
INTRAMUSCULAR | Status: AC
  Filled 2015-11-12: qty 30

## 2015-11-12 MED ORDER — PANTOPRAZOLE SODIUM 40 MG PO TBEC
40.0000 mg | DELAYED_RELEASE_TABLET | Freq: Every day | ORAL | Status: DC
  Administered 2015-11-12 – 2015-11-13 (×2): 40 mg via ORAL
  Filled 2015-11-12 (×2): qty 1

## 2015-11-12 MED ORDER — SCOPOLAMINE 1 MG/3DAYS TD PT72
1.0000 | MEDICATED_PATCH | TRANSDERMAL | Status: DC
  Administered 2015-11-12: 1.5 mg via TRANSDERMAL

## 2015-11-12 MED ORDER — HEPARIN SODIUM (PORCINE) 5000 UNIT/ML IJ SOLN
5000.0000 [IU] | Freq: Three times a day (TID) | INTRAMUSCULAR | Status: DC
  Administered 2015-11-12 – 2015-11-14 (×6): 5000 [IU] via SUBCUTANEOUS
  Filled 2015-11-12 (×6): qty 1

## 2015-11-12 MED ORDER — GLYCOPYRROLATE 0.2 MG/ML IJ SOLN
INTRAMUSCULAR | Status: DC | PRN
  Administered 2015-11-12: 1 mg via INTRAVENOUS
  Administered 2015-11-12: 0.2 mg via INTRAVENOUS

## 2015-11-12 MED ORDER — SODIUM CHLORIDE FLUSH 0.9 % IV SOLN
INTRAVENOUS | Status: AC
  Filled 2015-11-12: qty 10

## 2015-11-12 MED ORDER — CEFAZOLIN SODIUM-DEXTROSE 2-4 GM/100ML-% IV SOLN
2.0000 g | INTRAVENOUS | Status: AC
  Administered 2015-11-12: 2 g via INTRAVENOUS

## 2015-11-12 MED ORDER — THROMBIN 5000 UNITS EX SOLR
CUTANEOUS | Status: DC | PRN
  Administered 2015-11-12: 5000 [IU] via TOPICAL

## 2015-11-12 MED ORDER — HYDROMORPHONE HCL 1 MG/ML IJ SOLN
INTRAMUSCULAR | Status: AC
  Administered 2015-11-12: 0.25 mg via INTRAVENOUS
  Filled 2015-11-12: qty 1

## 2015-11-12 MED ORDER — EVICEL 5 ML EX KIT
PACK | CUTANEOUS | Status: AC
  Filled 2015-11-12: qty 1

## 2015-11-12 MED ORDER — LIDOCAINE HCL (CARDIAC) 20 MG/ML IV SOLN
INTRAVENOUS | Status: DC | PRN
  Administered 2015-11-12: 100 mg via INTRAVENOUS

## 2015-11-12 MED ORDER — DEXAMETHASONE SODIUM PHOSPHATE 10 MG/ML IJ SOLN
INTRAMUSCULAR | Status: DC | PRN
  Administered 2015-11-12: 10 mg via INTRAVENOUS

## 2015-11-12 MED ORDER — ONDANSETRON HCL 4 MG/2ML IJ SOLN
INTRAMUSCULAR | Status: DC | PRN
  Administered 2015-11-12: 4 mg via INTRAVENOUS

## 2015-11-12 MED ORDER — CEFAZOLIN SODIUM-DEXTROSE 2-4 GM/100ML-% IV SOLN
2.0000 g | Freq: Three times a day (TID) | INTRAVENOUS | Status: AC
  Administered 2015-11-12 – 2015-11-13 (×3): 2 g via INTRAVENOUS
  Filled 2015-11-12 (×3): qty 100

## 2015-11-12 MED ORDER — ROCURONIUM BROMIDE 100 MG/10ML IV SOLN
INTRAVENOUS | Status: DC | PRN
  Administered 2015-11-12: 20 mg via INTRAVENOUS
  Administered 2015-11-12: 50 mg via INTRAVENOUS
  Administered 2015-11-12 (×2): 10 mg via INTRAVENOUS

## 2015-11-12 MED ORDER — HYDROMORPHONE HCL 1 MG/ML IJ SOLN
0.2500 mg | INTRAMUSCULAR | Status: DC | PRN
  Administered 2015-11-12: 0.5 mg via INTRAVENOUS
  Administered 2015-11-12: 0.25 mg via INTRAVENOUS
  Administered 2015-11-12: 0.5 mg via INTRAVENOUS
  Administered 2015-11-12 (×3): 0.25 mg via INTRAVENOUS

## 2015-11-12 SURGICAL SUPPLY — 92 items
ANCHOR TIS RET SYS 235ML (MISCELLANEOUS) ×3 IMPLANT
APPLICATOR SURGIFLO (MISCELLANEOUS) ×3 IMPLANT
BNDG COHESIVE 4X5 TAN STRL (GAUZE/BANDAGES/DRESSINGS) IMPLANT
BULB RESERV EVAC DRAIN JP 100C (MISCELLANEOUS) ×3 IMPLANT
CANISTER SUCT 1200ML W/VALVE (MISCELLANEOUS) ×3 IMPLANT
CHLORAPREP W/TINT 26ML (MISCELLANEOUS) ×6 IMPLANT
CLIP LIGATING HEM O LOK PURPLE (MISCELLANEOUS) ×15 IMPLANT
CLIP LIGATING HEMO O LOK GREEN (MISCELLANEOUS) IMPLANT
CLIP SUT LAPRA TY ABSORB (SUTURE) ×12 IMPLANT
CORD BIP STRL DISP 12FT (MISCELLANEOUS) ×3 IMPLANT
COVER TIP SHEARS 8 DVNC (MISCELLANEOUS) ×1 IMPLANT
COVER TIP SHEARS 8MM DA VINCI (MISCELLANEOUS) ×2
DEFOGGER SCOPE WARMER CLEARIFY (MISCELLANEOUS) ×3 IMPLANT
DRAIN CHANNEL JP 19F (MISCELLANEOUS) ×3 IMPLANT
DRAPE SHEET LG 3/4 BI-LAMINATE (DRAPES) ×3 IMPLANT
DRAPE SURG 17X11 SM STRL (DRAPES) ×12 IMPLANT
DRIVER LRG NEEDLE DA VINCI (INSTRUMENTS) ×2
DRIVER NDLE LRG DVNC (INSTRUMENTS) ×1 IMPLANT
ELECT PAD DSPR THERM+ ADLT (MISCELLANEOUS) IMPLANT
ELECT REM PT RETURN 9FT ADLT (ELECTROSURGICAL) ×3
ELECTRODE REM PT RTRN 9FT ADLT (ELECTROSURGICAL) ×1 IMPLANT
FILTER LAP SMOKE EVAC STRL (MISCELLANEOUS) ×3 IMPLANT
GLOVE BIO SURGEON STRL SZ 6.5 (GLOVE) ×6 IMPLANT
GLOVE BIO SURGEONS STRL SZ 6.5 (GLOVE) ×3
GLOVE BIOGEL PI IND STRL 6.5 (GLOVE) ×3 IMPLANT
GLOVE BIOGEL PI INDICATOR 6.5 (GLOVE) ×6
GOWN STRL REUS W/ TWL LRG LVL3 (GOWN DISPOSABLE) ×6 IMPLANT
GOWN STRL REUS W/TWL LRG LVL3 (GOWN DISPOSABLE) ×12
GRASPER DBL FENESTRATED (INSTRUMENTS) ×2
GRASPER DBL FENESTRATED DVNC (INSTRUMENTS) ×1 IMPLANT
GRASPER SUT TROCAR 14GX15 (MISCELLANEOUS) ×3 IMPLANT
HEMOSTAT SURGICEL 2X14 (HEMOSTASIS) ×6 IMPLANT
IRRIGATION STRYKERFLOW (MISCELLANEOUS) ×1 IMPLANT
IRRIGATOR STRYKERFLOW (MISCELLANEOUS) ×3
IV LACTATED RINGERS 1000ML (IV SOLUTION) IMPLANT
IV NS 1000ML (IV SOLUTION) ×2
IV NS 1000ML BAXH (IV SOLUTION) ×1 IMPLANT
KIT ACCESSORY DA VINCI DISP (KITS) ×2
KIT ACCESSORY DVNC DISP (KITS) ×1 IMPLANT
KIT PINK PAD W/HEAD ARE REST (MISCELLANEOUS) ×6
KIT PINK PAD W/HEAD ARM REST (MISCELLANEOUS) ×2 IMPLANT
KIT RM TURNOVER STRD PROC AR (KITS) ×3 IMPLANT
LABEL OR SOLS (LABEL) ×3 IMPLANT
LIQUID BAND (GAUZE/BANDAGES/DRESSINGS) ×3 IMPLANT
LOOP RED MAXI  1X406MM (MISCELLANEOUS) ×2
LOOP VESSEL MAXI 1X406 RED (MISCELLANEOUS) ×1 IMPLANT
NDL SAFETY 18GX1.5 (NEEDLE) ×3 IMPLANT
NEEDLE HYPO 25X1 1.5 SAFETY (NEEDLE) ×3 IMPLANT
NEEDLE INSUFFLATION 14GA 120MM (NEEDLE) ×3 IMPLANT
NS IRRIG 500ML POUR BTL (IV SOLUTION) ×3 IMPLANT
PACK LAP CHOLECYSTECTOMY (MISCELLANEOUS) ×3 IMPLANT
PENCIL ELECTRO HAND CTR (MISCELLANEOUS) ×3 IMPLANT
PROBE ULTRASOUND PROART (MISCELLANEOUS) ×3 IMPLANT
PROGRASP ENDOWRIST DA VINCI (INSTRUMENTS) ×2
PROGRASP ENDOWRIST DVNC (INSTRUMENTS) ×1 IMPLANT
SCISSORS METZENBAUM CVD 33 (INSTRUMENTS) ×3 IMPLANT
SLEEVE ENDOPATH XCEL 5M (ENDOMECHANICALS) ×6 IMPLANT
SOLUTION ELECTROLUBE (MISCELLANEOUS) ×3 IMPLANT
SPOGE SURGIFLO 8M (HEMOSTASIS) ×2
SPONGE EXCIL AMD DRAIN 4X4 6P (MISCELLANEOUS) ×3 IMPLANT
SPONGE LAP 4X18 5PK (MISCELLANEOUS) ×3 IMPLANT
SPONGE SURGIFLO 8M (HEMOSTASIS) ×1 IMPLANT
STAPLE RELOAD 2.5MM WHITE (STAPLE) IMPLANT
STAPLER SKIN PROX 35W (STAPLE) ×3 IMPLANT
STAPLER VASCULAR ECHELON 35 (CUTTER) IMPLANT
STRAP SAFETY BODY (MISCELLANEOUS) ×9 IMPLANT
SUT DVC VLOC 90 3-0 CV23 UNDY (SUTURE) ×3 IMPLANT
SUT ETHILON 3-0 FS-10 30 BLK (SUTURE) ×3
SUT MNCRL AB 4-0 PS2 18 (SUTURE) ×6 IMPLANT
SUT PDS PLUS 0 (SUTURE)
SUT PDS PLUS AB 0 CT-2 (SUTURE) IMPLANT
SUT PROLENE 4 0 RB 1 (SUTURE) ×2
SUT PROLENE 4-0 RB1 .5 CRCL 36 (SUTURE) ×1 IMPLANT
SUT VIC AB 0 CT1 36 (SUTURE) ×6 IMPLANT
SUT VIC AB 0 CT2 27 (SUTURE) ×3 IMPLANT
SUT VIC AB 2-0 SH 27 (SUTURE) ×12
SUT VIC AB 2-0 SH 27XBRD (SUTURE) ×6 IMPLANT
SUT VICRYL 0 AB UR-6 (SUTURE) ×6 IMPLANT
SUT VICRYL PLUS ABS 0 54 (SUTURE) IMPLANT
SUTURE EHLN 3-0 FS-10 30 BLK (SUTURE) ×1 IMPLANT
SYR 3ML LL SCALE MARK (SYRINGE) IMPLANT
TAPE CLOTH 10X20 WHT NS LF (TAPE) ×2 IMPLANT
TAPE CLOTH 2X10 WHT NS LF (TAPE) ×4
TIP RIGID 35CM EVICEL (HEMOSTASIS) IMPLANT
TRAY FOLEY W/METER SILVER 16FR (SET/KITS/TRAYS/PACK) ×3 IMPLANT
TROCAR 12M 150ML BLUNT (TROCAR) ×3 IMPLANT
TROCAR DISP BLADELESS 8 DVNC (TROCAR) ×1 IMPLANT
TROCAR DISP BLADELESS 8MM (TROCAR) ×2
TROCAR ENDOPATH XCEL 12X100 BL (ENDOMECHANICALS) ×3 IMPLANT
TROCAR XCEL 12X100 BLDLESS (ENDOMECHANICALS) ×3 IMPLANT
TROCAR XCEL NON-BLD 5MMX100MML (ENDOMECHANICALS) ×3 IMPLANT
TUBING INSUFFLATOR HI FLOW (MISCELLANEOUS) ×3 IMPLANT

## 2015-11-12 NOTE — OR Nursing (Signed)
Dr Pilar Jarvis in to see patient made aware of rash on back and fall that patient had 10 days ago.  No new orders.  Clarified blood order with MD special requirements was entered in error per md called blood bank and spoke with [facility] : [facility].

## 2015-11-12 NOTE — Progress Notes (Signed)
Pharmacy Note  Abx renal dose adjustment  Pt ordered cefazolin. CrCl ~ 62 ml/min No dose adjustment needed at this time.

## 2015-11-12 NOTE — Interval H&P Note (Signed)
History and Physical Interval Note:  11/12/2015 7:22 AM  Raymond Bennett.  has presented today for surgery, with the diagnosis of left renal mass  The various methods of treatment have been discussed with the patient and family. After consideration of risks, benefits and other options for treatment, the patient has consented to  Procedure(s): ROBOTIC ASSITED PARTIAL NEPHRECTOMY (Left) as a surgical intervention .  The patient's history has been reviewed, patient examined, no change in status, stable for surgery.  I have reviewed the patient's chart and labs.  Questions were answered to the patient's satisfaction.    RRR Lungs clear  [name]

## 2015-11-12 NOTE — Op Note (Addendum)
Date of procedure: 11/12/15  Preoperative diagnosis:  1. Left renal mass   Postoperative diagnosis:  1. Left renal mass   Procedure: 1. Left robotic partial nephrectomy with intraoperative ultrasound  Surgeon: Baruch Gouty, MD  Assistant: Hollice Espy, MD  Anesthesia: General  Complications: None  Intraoperative findings: The patient's left renal mass was identified and successfully removed. Prior to removal though, it was confirmed to be the correct size and location with the aid of intraoperative ultrasound.  EBL: 50 cc  Specimens: Left renal mass  Drains: 16 French Foley catheter and JP drain  Disposition: Stable to the postanesthesia care unit  Indication for procedure: The patient is a 70 y.o. male with a left renal mass was concerning for RCC as identified on both CT and MRI imaging. He presents today for definitive surgical management.  After reviewing the management options for treatment, the patient elected to proceed with the above surgical procedure(s). We have discussed the potential benefits and risks of the procedure, side effects of the proposed treatment, the likelihood of the patient achieving the goals of the procedure, and any potential problems that might occur during the procedure or recuperation. Informed consent has been obtained.  Description of procedure: The patient was met in the preoperative area. All risks, benefits, and indications of the procedure were described in great detail. The patient consented to the procedure. Preoperative antibiotics were given. The patient was taken to the operative theater. General anesthesia was induced per the anesthesia service. A Foley catheter was placed per the nursing staff. The patient was then placed in the right lateral decubitus/left side up position. All pressure points were padded to avoid postoperative neuropathy. A preoperative timeout was called.   To the patient's multiple abdominal surgeries decision was  made to enter the abdomen at the level of the midclavicular line 2 finger breaths below the 12th rib. A 5 mm incision was made. A Veress needle was then used to insufflate the abdomen to 15 mmHg. A 5 mm trocar was placed. Inspection abdomen did reveal adhesions within the right upper quadrant and infraumbilical adhesions. There is no major adhesions in the left upper quadrant is fortunate as was the site for trocar placement. A 12 mm port was placed lateral to midline. Three 8 mm robotic ports were placed. Of note was a millimeter robotic port was placed the 5 mm port used for initial attention to the abdomen. A 5 mm and 12 mm assistant port were placed as well. The robot was undocked.  The left hemicolon was then mobilized with the aid of the robot along the white line of Toldt. Once the resolved mobilization of the left hemicolon, care was then taken to identify the gonadal vein and the left ureter. This was accomplished with careful dissection. The ureter was identified and mobilized and held anteriorly while the left gonadal vein was followed and dissected and freed superiorly until the left renal vein was encountered. This was mobilized and a vessel loop was placed. After further dissection of the pelvis a second vein was identified and mobilized. Another vessel was placed. The left renal artery was also identified at this point and a vessel loop was placed. Attention was then turned to localizing the tumor. Per imaging, we knew it to be within the medial inferior pole of the kidney. Gerota's fascia was incised over a thought the tumor would be located. Perirenal fat was dissected off the kidney until the tumor was localized. Great care was taken as a  branch of the left renal artery as well as the ureter in close proximity. Care was taken to ensure the structures were not injured. Once the tumor was properly dissected free of surrounding fat, intraoperative ultrasound was used to confirm that this was the  lesion seen on the CT imaging. It was identical in size and heterogenous echoes echogenicity in. It clearly was not similar to normal renal parenchyma seen on ultrasound with surrounding tissues.   At this point, 2 bulldog clamps were placed on the left renal artery. The tumor was excised from surrounding healthy kidney. There is a negative gross margins with normal renal parenchyma at the base of the resection site. The previously seen nearby ureter and branch of the left renal artery were uninjured. The base of the tumor bed was reapproximated with good control of hemostasis a running 3-0 V-lock suture. A Weck clip and a Lapra-Ty were placed on each end.  The oozing from venous back bleeding seen prior to this suture being placed at stop. The defect was reapproximated with a 3-0 Vicryl. This was done by placing a white clot at the end of the suture and taking a deep bite from outside the capsule into the parenchyma then completing the suture ready for in a suture from the parenchyma to the contralateral capsule. A Weck clip was placed and the suture tightened. This then took place with 2 more throws of similar fashion. Hemostasis was excellent at this point. The bulldog clamps were then removed. Total clamp time was 19 minutes. Tumor had been placed in the Endo Catch bag after removing the tumor bed. FloSeal was placed in the resection bed and hilum. Vicryl sealant was then placed over the FloSeal. Vessel loops were removed. Hemostasis was excellent. JP drain was placed in the most lateral 8 mm trochars site. The robot was undocked. The trochars were marked out under direct visualization. The tumors removed through the 12 mm camera site. The 12 mm ports were closed with 0 Vicryl through the fascia. Skin was reapproximated with 4-0 Monocryl and Dermabond applied. 7 cc of 1% bupivacaine was injected into the incision sites. JP drain was secured to the skin with a drain stitch. The patient was woken from  anesthesia and transferred in stable condition to the post anesthesia care unit.   Dr. Erlene Quan assisted scrubbed in bedside while the Dr. Pilar Jarvis was performing the robotic portion of the case.  Plan: The patient will be admitted to the floor overnight. His Foley will be discontinued in the morning. His JP drain will likely be removed prior to discharge home.  Baruch Gouty, M.D.

## 2015-11-12 NOTE — H&P (View-Only) (Signed)
10/30/2015 9:38 AM   Carin Hock 1945-09-12 [id number]  Referring provider: Albina Billet, MD [address]   [place], [place] [address]  Chief Complaint  Patient presents with  . Pre-op Exam    Renal Mass    HPI: The patient is here to further discuss his 1 cm lower pole left renal mass is concerning for RCC. He presents today for further discussions regarding surgical management. He has no new complaints at this time.   PMH: Past Medical History:  Diagnosis Date  . Cancer ([place])   . Neuropathy ([place])   . Non Hodgkin's lymphoma ([place])   . Reflux     Surgical History: Past Surgical History:  Procedure Laterality Date  . BONE MARROW TRANSPLANT    . FRACTURE SURGERY    . GALLBLADDER SURGERY      Home Medications:    Medication List       Accurate as of 10/30/15  9:38 AM. Always use your most recent med list.          cholecalciferol 1000 units tablet Commonly known as:  VITAMIN D Take 1,000 Units by mouth daily.   FLUOCINOLONE ACETONIDE BODY 0.01 % Oil   IRON-150 PO Take by mouth.   magnesium oxide 400 MG tablet Commonly known as:  MAG-OX Take 400 mg by mouth daily.   omeprazole 40 MG capsule Commonly known as:  PRILOSEC Reported on 08/06/2015   PROBIOTIC + OMEGA-3 Caps Take by mouth.   triamcinolone cream 0.1 % Commonly known as:  KENALOG   vitamin B-12 1000 MCG tablet Commonly known as:  CYANOCOBALAMIN Take 1,000 mcg by mouth daily.       Allergies: No Known Allergies  Family History: History reviewed. No pertinent family history.  Social History:  reports that he has never smoked. He has never used smokeless tobacco. His alcohol and drug histories are not on file.  ROS: UROLOGY Frequent Urination?: No Hard to postpone urination?: No Burning/pain with urination?: No Get up at night to urinate?: No Leakage of urine?: No Urine stream starts and stops?: No Trouble starting stream?: No Do you have to strain to urinate?:  No Blood in urine?: No Urinary tract infection?: No Sexually transmitted disease?: No Injury to kidneys or bladder?: No Painful intercourse?: No Weak stream?: No Erection problems?: No Penile pain?: No  Gastrointestinal Nausea?: No Vomiting?: No Indigestion/heartburn?: No Diarrhea?: No Constipation?: No  Constitutional Fever: No Night sweats?: No Weight loss?: No Fatigue?: No  Skin Skin rash/lesions?: No Itching?: Yes  Eyes Blurred vision?: No Double vision?: No  Ears/Nose/Throat Sore throat?: No Sinus problems?: No  Hematologic/Lymphatic Swollen glands?: No Easy bruising?: No  Cardiovascular Leg swelling?: No Chest pain?: No  Respiratory Cough?: No Shortness of breath?: No  Endocrine Excessive thirst?: No  Musculoskeletal Back pain?: No Joint pain?: No  Neurological Headaches?: No Dizziness?: No  Psychologic Depression?: No Anxiety?: No  Physical Exam: BP 118/78   Pulse (!) 114   Ht 5\' 8"  (1.727 m)   Wt 201 lb (91.2 kg)   BMI 30.56 kg/m   Constitutional:  Alert and oriented, No acute distress. HEENT: [place] AT, moist mucus membranes.  Trachea midline, no masses. Cardiovascular: No clubbing, cyanosis, or edema. Respiratory: Normal respiratory effort, no increased work of breathing. GI: Abdomen is soft, nontender, nondistended, no abdominal masses GU: No CVA tenderness.  Skin: No rashes, bruises or suspicious lesions. Lymph: No cervical or inguinal adenopathy. Neurologic: Grossly intact, no focal deficits, moving  all 4 extremities. Psychiatric: Normal mood and affect.  Laboratory Data: No results found for: WBC, HGB, HCT, MCV, PLT  Lab Results  Component Value Date   CREATININE 1.30 (H) 07/30/2015    No results found for: PSA  No results found for: TESTOSTERONE  No results found for: HGBA1C  Urinalysis No results found for: COLORURINE, APPEARANCEUR, LABSPEC, PHURINE, GLUCOSEU, HGBUR, BILIRUBINUR, KETONESUR, PROTEINUR,  UROBILINOGEN, NITRITE, LEUKOCYTESUR   Assessment & Plan:    1. Left renal mass I again had a long discussion with those approximately 45 minutes with the patient regarding treatment options. These include active surveillance, partial nephrectomy robotically, and cryoablation. The patient has decided on a left robotic partial nephrectomy. He understands the goal would be to remove just the tumor. He understands this may be somewhat difficult given his multiple previous abdominal surgeries. He understands the risks also include but are not limited to bleeding, infection, having to perform a radical nephrectomy, severe blood loss, damage to surrounding structures, ureteral injury, DVT, anesthetic complications, and death. He understands these risks. He also understands that he will the Foley catheter draining surgery. He has elected to proceed with surgery.    [name], MD  [facility] [address], [place], [place] [address] [phone]

## 2015-11-12 NOTE — Transfer of Care (Signed)
Immediate Anesthesia Transfer of Care Note  Patient: Raymond Bennett.  Procedure(s) Performed: Procedure(s): ROBOTIC ASSITED PARTIAL NEPHRECTOMY (Left)  Patient Location: PACU  Anesthesia Type:General  Level of Consciousness: awake  Airway & Oxygen Therapy: Patient connected to face mask oxygen  Post-op Assessment: Post -op Vital signs reviewed and stable  Post vital signs: stable  Last Vitals:  Vitals:   11/12/15 0611 11/12/15 1209  BP: 138/79 (!) 154/92  Pulse: 76 98  Resp: 16 15  Temp: 36.7 C (!) 35.7 C    Last Pain:  Vitals:   11/12/15 1209  TempSrc: Temporal         Complications: No apparent anesthesia complications

## 2015-11-12 NOTE — Anesthesia Preprocedure Evaluation (Signed)
Anesthesia Evaluation  Patient identified by MRN, date of birth, ID band Patient awake    Reviewed: Allergy & Precautions, NPO status , Patient's Chart, lab work & pertinent test results  History of Anesthesia Complications (+) PONV  Airway Mallampati: II  TM Distance: >3 FB Neck ROM: Full    Dental  (+) Caps   Pulmonary neg pulmonary ROS, neg sleep apnea, neg COPD,    breath sounds clear to auscultation- rhonchi (-) wheezing      Cardiovascular Exercise Tolerance: Good (-) hypertension(-) CAD and (-) Past MI  Rhythm:Regular Rate:Normal - Systolic murmurs and - Diastolic murmurs    Neuro/Psych negative neurological ROS  negative psych ROS   GI/Hepatic Neg liver ROS, hiatal hernia, GERD  ,  Endo/Other  negative endocrine ROSneg diabetes  Renal/GU CRFRenal disease     Musculoskeletal negative musculoskeletal ROS (+)   Abdominal (+) + obese,   Peds  Hematology negative hematology ROS (+)   Anesthesia Other Findings Past Medical History: No date: Cancer ([place]) No date: Chronic kidney disease     Comment: renal cell carcinoma No date: GERD (gastroesophageal reflux disease) No date: History of hiatal hernia No date: Neuropathy ([facility]) No date: Non Hodgkin's lymphoma ([facility]) No date: PONV (postoperative nausea and vomiting)     Comment: "difficulty waking up after surgery" No date: Reflux   Reproductive/Obstetrics                             Anesthesia Physical Anesthesia Plan  ASA: III  Anesthesia Plan: General   Post-op Pain Management:    Induction: Intravenous  Airway Management Planned: Oral ETT  Additional Equipment:   Intra-op Plan:   Post-operative Plan: Extubation in OR  Informed Consent: I have reviewed the patients History and Physical, chart, labs and discussed the procedure including the risks, benefits and alternatives for the proposed anesthesia with the patient or  authorized representative who has indicated his/her understanding and acceptance.   Dental advisory given  Plan Discussed with: CRNA and Anesthesiologist  Anesthesia Plan Comments:         Lab Results  Component Value Date   WBC 11.2 (H) 10/30/2015   HGB 14.5 10/30/2015   HCT 41.8 10/30/2015   MCV 98.4 10/30/2015   PLT 185 10/30/2015    Anesthesia Quick Evaluation

## 2015-11-12 NOTE — Anesthesia Postprocedure Evaluation (Signed)
Anesthesia Post Note  Patient: Bowen Mijares.  Procedure(s) Performed: Procedure(s) (LRB): ROBOTIC ASSITED PARTIAL NEPHRECTOMY (Left)  Patient location during evaluation: PACU Anesthesia Type: General Level of consciousness: awake and alert and oriented Pain management: pain level controlled Vital Signs Assessment: post-procedure vital signs reviewed and stable Respiratory status: spontaneous breathing, nonlabored ventilation and respiratory function stable Cardiovascular status: blood pressure returned to baseline and stable Postop Assessment: no signs of nausea or vomiting Anesthetic complications: no    Last Vitals:  Vitals:   11/12/15 1253 11/12/15 1259  BP: 139/80   Pulse: 88 86  Resp: 13 13  Temp:      Last Pain:  Vitals:   11/12/15 1259  TempSrc:   PainSc: 9                  Rik Wadel

## 2015-11-12 NOTE — OR Nursing (Signed)
Renal Clamp time start at 1059 Renal Clamp time stop at 1118 Total time 19 mins

## 2015-11-12 NOTE — Anesthesia Procedure Notes (Signed)
Procedure Name: Intubation Date/Time: 11/12/2015 7:44 AM Performed by: Aline Brochure Pre-anesthesia Checklist: Emergency Drugs available, Patient identified, Suction available and Patient being monitored Patient Re-evaluated:Patient Re-evaluated prior to inductionOxygen Delivery Method: Circle system utilized Preoxygenation: Pre-oxygenation with 100% oxygen Intubation Type: IV induction Ventilation: Mask ventilation without difficulty Laryngoscope Size: Mac and 4 Grade View: Grade II Tube type: Oral Tube size: 7.5 mm Number of attempts: 1 Airway Equipment and Method: Bougie stylet Placement Confirmation: positive ETCO2 and breath sounds checked- equal and bilateral Secured at: 22 cm Tube secured with: Tape Dental Injury: Teeth and Oropharynx as per pre-operative assessment  Difficulty Due To: Difficult Airway- due to anterior larynx

## 2015-11-13 ENCOUNTER — Encounter: Payer: Self-pay | Admitting: Urology

## 2015-11-13 LAB — TYPE AND SCREEN
ABO/RH(D): O NEG
ANTIBODY SCREEN: NEGATIVE
UNIT DIVISION: 0
Unit division: 0

## 2015-11-13 LAB — CBC
HCT: 36 % — ABNORMAL LOW (ref 40.0–52.0)
HEMOGLOBIN: 12.5 g/dL — AB (ref 13.0–18.0)
MCH: 34.1 pg — AB (ref 26.0–34.0)
MCHC: 34.7 g/dL (ref 32.0–36.0)
MCV: 98.3 fL (ref 80.0–100.0)
PLATELETS: 200 10*3/uL (ref 150–440)
RBC: 3.66 MIL/uL — AB (ref 4.40–5.90)
RDW: 15.4 % — ABNORMAL HIGH (ref 11.5–14.5)
WBC: 20 10*3/uL — ABNORMAL HIGH (ref 3.8–10.6)

## 2015-11-13 LAB — BASIC METABOLIC PANEL
ANION GAP: 8 (ref 5–15)
BUN: 19 mg/dL (ref 6–20)
CALCIUM: 8.3 mg/dL — AB (ref 8.9–10.3)
CO2: 23 mmol/L (ref 22–32)
CREATININE: 1.35 mg/dL — AB (ref 0.61–1.24)
Chloride: 106 mmol/L (ref 101–111)
GFR, EST AFRICAN AMERICAN: 59 mL/min — AB (ref >60)
GFR, EST NON AFRICAN AMERICAN: 51 mL/min — AB (ref >60)
GLUCOSE: 122 mg/dL — AB (ref 65–99)
Potassium: 4.3 mmol/L (ref 3.5–5.1)
Sodium: 137 mmol/L (ref 135–145)

## 2015-11-13 LAB — PREPARE RBC (CROSSMATCH)

## 2015-11-13 NOTE — Progress Notes (Signed)
Discontinued foley catheter per MD order

## 2015-11-13 NOTE — Progress Notes (Signed)
No events overnight Slightly high WBC at 20 No n/v/f/c Pain controlled  Vitals:   11/12/15 1531 11/12/15 1604 11/12/15 2027 11/13/15 0459  BP:  130/81 126/64 (!) 110/51  Pulse:  95 92 84  Resp:  17 16 16   Temp:  98.3 F (36.8 C) 97.9 F (36.6 C) 98.2 F (36.8 C)  TempSrc:  Oral Oral Oral  SpO2:  97% 96% 94%  Weight: 201 lb (91.2 kg)     Height: 5\' 8"  (1.727 m)      I/O last 3 completed shifts: In: 2517 [P.O.:60; I.V.:2432; Other:25] Out: [id number]; Drains:90; Blood:75] Total I/O In: 1003 [I.V.:1003] Out: 127.5 [Urine:125; Other:2.5]   NAD Soft approp T mild D Inc c/d/i jp ss Foley clear  CBC    Component Value Date/Time   WBC 20.0 (H) 11/13/2015 0507   RBC 3.66 (L) 11/13/2015 0507   HGB 12.5 (L) 11/13/2015 0507   HCT 36.0 (L) 11/13/2015 0507   PLT 200 11/13/2015 0507   MCV 98.3 11/13/2015 0507   MCH 34.1 (H) 11/13/2015 0507   MCHC 34.7 11/13/2015 0507   RDW 15.4 (H) 11/13/2015 0507   BMP Latest Ref Rng & Units 11/13/2015 10/30/2015 07/30/2015  Glucose 65 - 99 mg/dL 122(H) 79 -  BUN 6 - 20 mg/dL 19 17 -  Creatinine 0.61 - 1.24 mg/dL 1.35(H) 1.20 1.30(H)  Sodium 135 - 145 mmol/L 137 140 -  Potassium 3.5 - 5.1 mmol/L 4.3 3.7 -  Chloride 101 - 111 mmol/L 106 104 -  CO2 22 - 32 mmol/L 23 32 -  Calcium 8.9 - 10.3 mg/dL 8.3(L) 9.6 -   POD 1 L robo partial nx. Doing well -adv diet -d/c foley -oob/ambulate/is -monitor WBC -continue JP -hopefully home tomorrow

## 2015-11-14 LAB — CBC
HEMATOCRIT: 36.3 % — AB (ref 40.0–52.0)
Hemoglobin: 12.4 g/dL — ABNORMAL LOW (ref 13.0–18.0)
MCH: 33.7 pg (ref 26.0–34.0)
MCHC: 34.2 g/dL (ref 32.0–36.0)
MCV: 98.6 fL (ref 80.0–100.0)
Platelets: 171 10*3/uL (ref 150–440)
RBC: 3.68 MIL/uL — AB (ref 4.40–5.90)
RDW: 15.2 % — ABNORMAL HIGH (ref 11.5–14.5)
WBC: 14.6 10*3/uL — AB (ref 3.8–10.6)

## 2015-11-14 LAB — BASIC METABOLIC PANEL
ANION GAP: 6 (ref 5–15)
BUN: 18 mg/dL (ref 6–20)
CO2: 26 mmol/L (ref 22–32)
Calcium: 8.5 mg/dL — ABNORMAL LOW (ref 8.9–10.3)
Chloride: 106 mmol/L (ref 101–111)
Creatinine, Ser: 1.15 mg/dL (ref 0.61–1.24)
GFR calc Af Amer: >60 mL/min (ref >60)
GLUCOSE: 100 mg/dL — AB (ref 65–99)
POTASSIUM: 3.9 mmol/L (ref 3.5–5.1)
Sodium: 138 mmol/L (ref 135–145)

## 2015-11-14 MED ORDER — HYDROCODONE-ACETAMINOPHEN 5-325 MG PO TABS: 1.0000 | ORAL_TABLET | ORAL | 0 refills | Status: DC | PRN

## 2015-11-14 NOTE — Care Management Important Message (Signed)
Important Message  Patient Details  Name: Raymond Bennett. MRN: [id number] Date of Birth: 12/15/1945   Medicare Important Message Given:  N/A - LOS <3 / Initial given by admissions    Beverly Sessions, RN 11/14/2015, 10:01 AM

## 2015-11-14 NOTE — Discharge Summary (Signed)
Date of admission: 11/12/2015  Date of discharge: 11/14/2015  Admission diagnosis: Left renal mass  Discharge diagnosis: Left renal mass  Secondary diagnoses:  Patient Active Problem List   Diagnosis Date Noted  . Renal mass 11/12/2015    History and Physical: For full details, please see admission history and physical. Briefly, Raymond Bennett. is a 70 y.o. year old patient with A left renal mass underwent a left robotic partial nephrectomy.   Hospital Course: Patient tolerated the procedure well.  He was then transferred to the floor after an uneventful PACU stay.  His hospital course was uncomplicated.  On POD#2 he had met discharge criteria: was eating a regular diet, was up and ambulating independently,  pain was well controlled, was voiding without a catheter, JP drain was out, and was ready to for discharge.   Laboratory values:   Recent Labs  11/13/15 0507 11/14/15 0434  WBC 20.0* 14.6*  HGB 12.5* 12.4*  HCT 36.0* 36.3*    Recent Labs  11/13/15 0507 11/14/15 0434  NA 137 138  K 4.3 3.9  CL 106 106  CO2 23 26  GLUCOSE 122* 100*  BUN 19 18  CREATININE 1.35* 1.15  CALCIUM 8.3* 8.5*   No results for input(s): LABPT, INR in the last 72 hours. No results for input(s): LABURIN in the last 72 hours. Results for orders placed or performed in visit on 10/30/15  CULTURE, URINE COMPREHENSIVE     Status: None   Collection Time: 10/30/15  9:52 AM  Result Value Ref Range Status   Urine Culture, Comprehensive Final report  Final   Result 1 Comment  Final    Comment: No growth in 36 - 48 hours.    Disposition: Home  Discharge instruction: The patient was instructed to be ambulatory but told to refrain from heavy lifting, strenuous activity, or driving.   Discharge medications:   Medication List    TAKE these medications   cholecalciferol 1000 units tablet Commonly known as:  VITAMIN D Take 1,000 Units by mouth daily.   FLUOCINOLONE ACETONIDE BODY 0.01 % Oil as  needed.   HYDROcodone-acetaminophen 5-325 MG tablet Commonly known as:  NORCO/VICODIN Take 1-2 tablets by mouth every 4 (four) hours as needed for moderate pain.   IRON-150 PO Take 1 tablet by mouth daily.   magnesium oxide 400 MG tablet Commonly known as:  MAG-OX Take 400 mg by mouth daily.   omeprazole 40 MG capsule Commonly known as:  PRILOSEC Take 40 mg by mouth daily. Reported on 08/06/2015   PROBIOTIC + OMEGA-3 Caps Take 1 capsule by mouth daily.   triamcinolone cream 0.1 % Commonly known as:  KENALOG Apply 1 application topically as needed.   vitamin B-12 1000 MCG tablet Commonly known as:  CYANOCOBALAMIN Take 1,000 mcg by mouth daily.       Followup:  Follow-up Marion, MD Follow up in 1 week(s).   Specialty:  Urology Contact information: [address] [place] [address] [phone]

## 2015-11-14 NOTE — Progress Notes (Signed)
JP drain removed per MD order. Dressing placed on incision site. No issues noted.

## 2015-11-20 ENCOUNTER — Encounter: Payer: Self-pay | Admitting: Urology

## 2015-11-20 ENCOUNTER — Ambulatory Visit (INDEPENDENT_AMBULATORY_CARE_PROVIDER_SITE_OTHER): Payer: Medicare Other | Admitting: Urology

## 2015-11-20 VITALS — BP 131/86 | HR 88 | Ht 68.5 in | Wt 198.8 lb

## 2015-11-20 DIAGNOSIS — N2889 Other specified disorders of kidney and ureter: Secondary | ICD-10-CM

## 2015-11-20 MED ORDER — HYDROCODONE-ACETAMINOPHEN 5-325 MG PO TABS: 1.0000 | ORAL_TABLET | ORAL | 0 refills | Status: DC | PRN

## 2015-11-20 NOTE — Progress Notes (Signed)
11/20/2015 11:31 AM   Carin Hock Aug 16, 1945 [id number]  Referring provider: Albina Billet, MD [address]   [place], [place] [address]  Chief Complaint  Patient presents with  . Routine Post Op    ROBOTIC ASSITED PARTIAL NEPHRECTOMY    HPI: The patient is a 70 year old male who is one-week status post left partial nephrectomy. He presents today for follow-up. Doing well postoperatively. No nausea or vomiting. Having good bowel movements. Pain is controlled.  Pathology is still pending. Per discussion with the pathologist it is either a low-grade renal cell carcinoma versus oncocytoma    PMH: Past Medical History:  Diagnosis Date  . Cancer ([place])   . Chronic kidney disease    renal cell carcinoma  . GERD (gastroesophageal reflux disease)   . History of hiatal hernia   . Neuropathy ([place])   . Non Hodgkin's lymphoma ([place])   . PONV (postoperative nausea and vomiting)    "difficulty waking up after surgery"  . Reflux     Surgical History: Past Surgical History:  Procedure Laterality Date  . BONE MARROW TRANSPLANT    . CHOLECYSTECTOMY    . FRACTURE SURGERY    . GALLBLADDER SURGERY    . HERNIA REPAIR Left    Inguinal Hernia Repair  . ROBOTIC ASSITED PARTIAL NEPHRECTOMY Left 11/12/2015   Procedure: ROBOTIC ASSITED PARTIAL NEPHRECTOMY;  Surgeon: [name], MD;  Location: [facility] ORS;  Service: Urology;  Laterality: Left;  . TONSILLECTOMY      Home Medications:    Medication List       Accurate as of 11/20/15 11:31 AM. Always use your most recent med list.          cholecalciferol 1000 units tablet Commonly known as:  VITAMIN D Take 1,000 Units by mouth daily.   FLUOCINOLONE ACETONIDE BODY 0.01 % Oil as needed.   HYDROcodone-acetaminophen 5-325 MG tablet Commonly known as:  NORCO/VICODIN Take 1-2 tablets by mouth every 4 (four) hours as needed for moderate pain.   IRON-150 PO Take 1 tablet by mouth daily.   magnesium oxide 400 MG  tablet Commonly known as:  MAG-OX Take 400 mg by mouth daily.   omeprazole 40 MG capsule Commonly known as:  PRILOSEC Take 40 mg by mouth daily. Reported on 08/06/2015   PROBIOTIC + OMEGA-3 Caps Take 1 capsule by mouth daily.   vitamin B-12 1000 MCG tablet Commonly known as:  CYANOCOBALAMIN Take 1,000 mcg by mouth daily.       Allergies: No Known Allergies  Family History: No family history on file.  Social History:  reports that he has never smoked. He has never used smokeless tobacco. He reports that he does not drink alcohol or use drugs.  ROS: UROLOGY Frequent Urination?: No Hard to postpone urination?: No Burning/pain with urination?: No Get up at night to urinate?: No Leakage of urine?: No Urine stream starts and stops?: No Trouble starting stream?: No Do you have to strain to urinate?: No Blood in urine?: No Urinary tract infection?: No Sexually transmitted disease?: No Injury to kidneys or bladder?: No Painful intercourse?: No Weak stream?: No Erection problems?: No Penile pain?: No  Gastrointestinal Nausea?: No Vomiting?: No Indigestion/heartburn?: No Diarrhea?: No Constipation?: No  Constitutional Fever: No Night sweats?: No Weight loss?: No Fatigue?: No  Skin Skin rash/lesions?: No Itching?: No  Eyes Blurred vision?: No Double vision?: No  Ears/Nose/Throat Sore throat?: No Sinus problems?: No  Hematologic/Lymphatic Swollen glands?: No Easy bruising?:  No  Cardiovascular Leg swelling?: No Chest pain?: No  Respiratory Cough?: No Shortness of breath?: No  Endocrine Excessive thirst?: No  Musculoskeletal Back pain?: No Joint pain?: No  Neurological Headaches?: No Dizziness?: No  Psychologic Depression?: No Anxiety?: No  Physical Exam: BP 131/86   Pulse 88   Ht 5' 8.5" (1.74 m)   Wt 198 lb 12.8 oz (90.2 kg)   BMI 29.79 kg/m   Constitutional:  Alert and oriented, No acute distress. HEENT: [place] AT, moist mucus  membranes.  Trachea midline, no masses. Cardiovascular: No clubbing, cyanosis, or edema. Respiratory: Normal respiratory effort, no increased work of breathing. GI: Abdomen is soft, nontender, nondistended, no abdominal masses GU: No CVA tenderness. Incisions clean and dry and intact. Skin: No rashes, bruises or suspicious lesions. Lymph: No cervical or inguinal adenopathy. Neurologic: Grossly intact, no focal deficits, moving all 4 extremities. Psychiatric: Normal mood and affect.  Laboratory Data: Lab Results  Component Value Date   WBC 14.6 (H) 11/14/2015   HGB 12.4 (L) 11/14/2015   HCT 36.3 (L) 11/14/2015   MCV 98.6 11/14/2015   PLT 171 11/14/2015    Lab Results  Component Value Date   CREATININE 1.15 11/14/2015    No results found for: PSA  No results found for: TESTOSTERONE  No results found for: HGBA1C  Urinalysis No results found for: COLORURINE, APPEARANCEUR, LABSPEC, PHURINE, GLUCOSEU, HGBUR, BILIRUBINUR, KETONESUR, PROTEINUR, UROBILINOGEN, NITRITE, LEUKOCYTESUR    Assessment & Plan:   1. Left renal mass Pathology is still pending. Per discussion with the pathologist it is either a low-grade renal cell carcinoma versus oncocytoma (benign). Further imaging if necessary will be based off final pathology results. We will call the patient with results and schedule imaging if needed.   [name], MD  [facility] [address], [place], [place] [address] [phone]

## 2015-11-24 LAB — SURGICAL PATHOLOGY

## 2015-11-25 ENCOUNTER — Telehealth: Payer: Self-pay

## 2015-11-25 DIAGNOSIS — N2889 Other specified disorders of kidney and ureter: Secondary | ICD-10-CM

## 2015-11-25 NOTE — Telephone Encounter (Signed)
Orders placed.

## 2015-11-25 NOTE — Telephone Encounter (Signed)
-----   Message from [name], MD sent at 11/24/2015  5:52 PM EDT ----- Just spoke with Mr Raymond Bennett and gave him his results. Per pathology report, he likely has a benign lesion. His report cannot rule malignancy out. Will plan for a CT abd/pelvis  W/w/o contrast in 6 months with follow up after. He will need a chest xray 6 months after that w/ CTs annually until 3 years. Thank you.

## 2016-04-22 ENCOUNTER — Other Ambulatory Visit: Payer: Self-pay

## 2016-05-13 ENCOUNTER — Ambulatory Visit
Admission: RE | Admit: 2016-05-13 | Discharge: 2016-05-13 | Disposition: A | Discharge status: Home / Self Care | Payer: Medicare Other | Source: Ambulatory Visit | Attending: Urology | Admitting: Urology

## 2016-05-13 DIAGNOSIS — K760 Fatty (change of) liver, not elsewhere classified: Secondary | ICD-10-CM | POA: Diagnosis not present

## 2016-05-13 DIAGNOSIS — N2889 Other specified disorders of kidney and ureter: Secondary | ICD-10-CM | POA: Insufficient documentation

## 2016-05-13 DIAGNOSIS — Z905 Acquired absence of kidney: Secondary | ICD-10-CM | POA: Diagnosis not present

## 2016-05-13 DIAGNOSIS — I700 Atherosclerosis of aorta: Secondary | ICD-10-CM | POA: Insufficient documentation

## 2016-05-13 LAB — POCT I-STAT CREATININE: Creatinine, Ser: 1.3 mg/dL — ABNORMAL HIGH (ref 0.61–1.24)

## 2016-05-13 MED ORDER — IOPAMIDOL (ISOVUE-370) INJECTION 76%
100.0000 mL | Freq: Once | INTRAVENOUS | Status: AC | PRN
  Administered 2016-05-13: 100 mL via INTRAVENOUS

## 2016-07-26 DIAGNOSIS — Z86010 Personal history of colonic polyps: Secondary | ICD-10-CM | POA: Insufficient documentation

## 2016-07-26 DIAGNOSIS — Z8719 Personal history of other diseases of the digestive system: Secondary | ICD-10-CM | POA: Insufficient documentation

## 2016-07-26 DIAGNOSIS — Z860101 Personal history of adenomatous and serrated colon polyps: Secondary | ICD-10-CM | POA: Insufficient documentation

## 2017-02-10 ENCOUNTER — Encounter: Payer: Self-pay | Admitting: *Deleted

## 2017-02-11 ENCOUNTER — Ambulatory Visit
Admission: RE | Admit: 2017-02-11 | Discharge: 2017-02-11 | Disposition: A | Discharge status: Home / Self Care | Payer: Medicare Other | Source: Ambulatory Visit | Attending: Unknown Physician Specialty | Admitting: Unknown Physician Specialty

## 2017-02-11 ENCOUNTER — Encounter: Payer: Self-pay | Admitting: *Deleted

## 2017-02-11 ENCOUNTER — Ambulatory Visit: Payer: Medicare Other | Admitting: Anesthesiology

## 2017-02-11 ENCOUNTER — Encounter
Admission: RE | Disposition: A | Discharge status: Home / Self Care | Payer: Self-pay | Source: Ambulatory Visit | Attending: Unknown Physician Specialty

## 2017-02-11 DIAGNOSIS — Z85528 Personal history of other malignant neoplasm of kidney: Secondary | ICD-10-CM | POA: Diagnosis not present

## 2017-02-11 DIAGNOSIS — Z1211 Encounter for screening for malignant neoplasm of colon: Secondary | ICD-10-CM | POA: Diagnosis present

## 2017-02-11 DIAGNOSIS — Z8572 Personal history of non-Hodgkin lymphomas: Secondary | ICD-10-CM | POA: Insufficient documentation

## 2017-02-11 DIAGNOSIS — Z79899 Other long term (current) drug therapy: Secondary | ICD-10-CM | POA: Diagnosis not present

## 2017-02-11 DIAGNOSIS — D125 Benign neoplasm of sigmoid colon: Secondary | ICD-10-CM | POA: Insufficient documentation

## 2017-02-11 DIAGNOSIS — K259 Gastric ulcer, unspecified as acute or chronic, without hemorrhage or perforation: Secondary | ICD-10-CM | POA: Diagnosis not present

## 2017-02-11 DIAGNOSIS — Z86010 Personal history of colonic polyps: Secondary | ICD-10-CM | POA: Insufficient documentation

## 2017-02-11 DIAGNOSIS — Z9481 Bone marrow transplant status: Secondary | ICD-10-CM | POA: Diagnosis not present

## 2017-02-11 DIAGNOSIS — D128 Benign neoplasm of rectum: Secondary | ICD-10-CM | POA: Insufficient documentation

## 2017-02-11 DIAGNOSIS — D123 Benign neoplasm of transverse colon: Secondary | ICD-10-CM | POA: Insufficient documentation

## 2017-02-11 DIAGNOSIS — K219 Gastro-esophageal reflux disease without esophagitis: Secondary | ICD-10-CM | POA: Insufficient documentation

## 2017-02-11 DIAGNOSIS — K317 Polyp of stomach and duodenum: Secondary | ICD-10-CM | POA: Diagnosis not present

## 2017-02-11 HISTORY — PX: COLONOSCOPY WITH PROPOFOL: SHX5780

## 2017-02-11 HISTORY — DX: Personal history of other diseases of the digestive system: Z87.19

## 2017-02-11 HISTORY — DX: Personal history of colon polyps, unspecified: Z86.0100

## 2017-02-11 HISTORY — DX: Personal history of colonic polyps: Z86.010

## 2017-02-11 HISTORY — DX: Other specified disorders of kidney and ureter: N28.89

## 2017-02-11 HISTORY — PX: ESOPHAGOGASTRODUODENOSCOPY (EGD) WITH PROPOFOL: SHX5813

## 2017-02-11 SURGERY — COLONOSCOPY WITH PROPOFOL
Anesthesia: General

## 2017-02-11 MED ORDER — PROPOFOL 10 MG/ML IV BOLUS
INTRAVENOUS | Status: DC | PRN
  Administered 2017-02-11: 50 mg via INTRAVENOUS

## 2017-02-11 MED ORDER — MIDAZOLAM HCL 5 MG/5ML IJ SOLN
INTRAMUSCULAR | Status: DC | PRN
  Administered 2017-02-11: 2 mg via INTRAVENOUS

## 2017-02-11 MED ORDER — SODIUM CHLORIDE 0.9 % IV SOLN
INTRAVENOUS | Status: DC
  Administered 2017-02-11 (×2): via INTRAVENOUS

## 2017-02-11 MED ORDER — ONDANSETRON HCL 4 MG/2ML IJ SOLN
INTRAMUSCULAR | Status: AC
  Filled 2017-02-11: qty 2

## 2017-02-11 MED ORDER — FENTANYL CITRATE (PF) 100 MCG/2ML IJ SOLN
INTRAMUSCULAR | Status: DC | PRN
Start: 2017-02-11 — End: 2017-02-11
  Administered 2017-02-11: 25 ug via INTRAVENOUS
  Administered 2017-02-11: 50 ug via INTRAVENOUS
  Administered 2017-02-11: 25 ug via INTRAVENOUS

## 2017-02-11 MED ORDER — SODIUM CHLORIDE 0.9 % IV SOLN: INTRAVENOUS | Status: DC

## 2017-02-11 MED ORDER — ONDANSETRON HCL 4 MG/2ML IJ SOLN
INTRAMUSCULAR | Status: DC | PRN
  Administered 2017-02-11: 4 mg via INTRAVENOUS

## 2017-02-11 MED ORDER — PHENYLEPHRINE HCL 10 MG/ML IJ SOLN
INTRAMUSCULAR | Status: DC | PRN
  Administered 2017-02-11 (×6): 100 ug via INTRAVENOUS
  Administered 2017-02-11: 200 ug via INTRAVENOUS

## 2017-02-11 MED ORDER — LIDOCAINE HCL (PF) 2 % IJ SOLN
INTRAMUSCULAR | Status: DC | PRN
  Administered 2017-02-11: 80 mg

## 2017-02-11 MED ORDER — GLYCOPYRROLATE 0.2 MG/ML IJ SOLN
INTRAMUSCULAR | Status: AC
  Filled 2017-02-11: qty 1

## 2017-02-11 MED ORDER — PROPOFOL 500 MG/50ML IV EMUL
INTRAVENOUS | Status: DC | PRN
  Administered 2017-02-11: 75 ug/kg/min via INTRAVENOUS

## 2017-02-11 MED ORDER — LIDOCAINE HCL (PF) 2 % IJ SOLN
INTRAMUSCULAR | Status: AC
  Filled 2017-02-11: qty 10

## 2017-02-11 MED ORDER — FENTANYL CITRATE (PF) 100 MCG/2ML IJ SOLN
INTRAMUSCULAR | Status: AC
  Filled 2017-02-11: qty 2

## 2017-02-11 MED ORDER — MIDAZOLAM HCL 2 MG/2ML IJ SOLN
INTRAMUSCULAR | Status: AC
  Filled 2017-02-11: qty 2

## 2017-02-11 MED ORDER — GLYCOPYRROLATE 0.2 MG/ML IJ SOLN
INTRAMUSCULAR | Status: DC | PRN
  Administered 2017-02-11: 0.2 mg via INTRAVENOUS

## 2017-02-11 NOTE — Anesthesia Preprocedure Evaluation (Addendum)
Anesthesia Evaluation  Patient identified by MRN, date of birth, ID band Patient awake    Reviewed: Allergy & Precautions, NPO status , Patient's Chart, lab work & pertinent test results, reviewed documented beta blocker date and time   History of Anesthesia Complications (+) PONV and history of anesthetic complications  Airway Mallampati: III  TM Distance: >3 FB     Dental  (+) Chipped, Missing   Pulmonary           Cardiovascular      Neuro/Psych    GI/Hepatic hiatal hernia, GERD  ,  Endo/Other    Renal/GU Renal disease     Musculoskeletal   Abdominal   Peds  Hematology   Anesthesia Other Findings Obese. Neprectomy. Lymphoma in remission.  Reproductive/Obstetrics                            Anesthesia Physical Anesthesia Plan  ASA: III  Anesthesia Plan: General   Post-op Pain Management:    Induction: Intravenous  PONV Risk Score and Plan:   Airway Management Planned:   Additional Equipment:   Intra-op Plan:   Post-operative Plan:   Informed Consent: I have reviewed the patients History and Physical, chart, labs and discussed the procedure including the risks, benefits and alternatives for the proposed anesthesia with the patient or authorized representative who has indicated his/her understanding and acceptance.     Plan Discussed with: CRNA  Anesthesia Plan Comments:         Anesthesia Quick Evaluation

## 2017-02-11 NOTE — Op Note (Signed)
[facility] Gastroenterology Patient Name: Raymond Bennett Procedure Date: 02/11/2017 9:25 AM MRN: [id number] Account #: [id number] Date of Birth: 08-06-1945 Admit Type: Outpatient Age: 71 Room: [facility] ENDO ROOM 1 Gender: Male Note Status: Finalized Procedure:            Colonoscopy Indications:          High risk colon cancer surveillance: Personal history                        of colonic polyps Providers:            Manya Silvas, MD Referring MD:         Leona Carry. Hall Busing, MD (Referring MD) Medicines:            Propofol per Anesthesia Complications:        No immediate complications. Procedure:            Pre-Anesthesia Assessment:                       - After reviewing the risks and benefits, the patient                        was deemed in satisfactory condition to undergo the                        procedure.                       After obtaining informed consent, the colonoscope was                        passed under direct vision. Throughout the procedure,                        the patient's blood pressure, pulse, and oxygen                        saturations were monitored continuously. The                        Colonoscope was introduced through the anus and                        advanced to the the cecum, identified by appendiceal                        orifice and ileocecal valve. The colonoscopy was                        performed without difficulty. The patient tolerated the                        procedure well. The quality of the bowel preparation                        was excellent. Findings:      A medium polyp was found in the transverse colon. The polyp was sessile.       The polyp was removed with a piecemeal technique using a hot snare.       Resection and retrieval were complete. To prevent bleeding after the  polypectomy, three hemostatic clips were successfully placed. There was       no bleeding during, or at the end, of the  procedure. This was done first       with the [facility] bill snare and remains with regular snare.      Two sessile polyps were found in the descending colon. The polyps were       diminutive in size. These polyps were removed with a jumbo cold forceps.       Resection and retrieval were complete.      A small polyp was found in the sigmoid colon. The polyp was sessile. The       polyp was removed with a hot snare. Resection and retrieval were       complete.      A diminutive polyp was found in the descending colon. The polyp was       sessile. The polyp was removed with a jumbo cold forceps. Resection and       retrieval were complete.      A small polyp was found in the sigmoid colon. The polyp was sessile. The       polyp was removed with a hot snare. Resection and retrieval were       complete.      A diminutive polyp was found in the rectum. The polyp was sessile. The       polyp was removed with a hot snare. Resection and retrieval were       complete. Impression:           - One medium polyp in the transverse colon, removed                        piecemeal using a hot snare. Resected and retrieved.                        Clips were placed.                       - Two diminutive polyps in the descending colon,                        removed with a jumbo cold forceps. Resected and                        retrieved.                       - One small polyp in the sigmoid colon, removed with a                        hot snare. Resected and retrieved.                       - One diminutive polyp in the descending colon, removed                        with a jumbo cold forceps. Resected and retrieved.                       - One small polyp in the sigmoid colon, removed with a  hot snare. Resected and retrieved.                       - One diminutive polyp in the rectum, removed with a                        hot snare. Resected and retrieved. Recommendation:       -  Await pathology results. Probably repeat in 1 year                        depending on pathology findings. Manya Silvas, MD 02/11/2017 10:37:10 AM This report has been signed electronically. Number of Addenda: 0 Note Initiated On: 02/11/2017 9:25 AM Scope Withdrawal Time: 0 hours 35 minutes 11 seconds  Total Procedure Duration: 0 hours 42 minutes 34 seconds       [facility]

## 2017-02-11 NOTE — Transfer of Care (Signed)
Immediate Anesthesia Transfer of Care Note  Patient: Raymond Bennett.  Procedure(s) Performed: COLONOSCOPY WITH PROPOFOL (N/A ) ESOPHAGOGASTRODUODENOSCOPY (EGD) WITH PROPOFOL (N/A )  Patient Location: PACU  Anesthesia Type:General  Level of Consciousness: sedated  Airway & Oxygen Therapy: Patient Spontanous Breathing and Patient connected to nasal cannula oxygen  Post-op Assessment: Report given to RN and Post -op Vital signs reviewed and stable  Post vital signs: Reviewed and stable  Last Vitals:  Vitals:   02/11/17 0857 02/11/17 1038  BP: 126/72 109/68  Pulse: 82   Resp: (!) 22   Temp: 36.6 C (!) 35.8 C  SpO2: 100%     Last Pain:  Vitals:   02/11/17 1038  TempSrc: Tympanic         Complications: No apparent anesthesia complications

## 2017-02-11 NOTE — Op Note (Signed)
[facility] Gastroenterology Patient Name: Raymond Bennett Procedure Date: 02/11/2017 9:26 AM MRN: [id number] Account #: [id number] Date of Birth: 17-Aug-1945 Admit Type: Outpatient Age: 71 Room: [facility] ENDO ROOM 1 Gender: Male Note Status: Finalized Procedure:            Upper GI endoscopy Indications:          Follow up gastric polyps. Providers:            Manya Silvas, MD Referring MD:         Leona Carry. Hall Busing, MD (Referring MD) Medicines:            Propofol per Anesthesia Complications:        No immediate complications. Procedure:            Pre-Anesthesia Assessment:                       - After reviewing the risks and benefits, the patient                        was deemed in satisfactory condition to undergo the                        procedure.                       After obtaining informed consent, the endoscope was                        passed under direct vision. Throughout the procedure,                        the patient's blood pressure, pulse, and oxygen                        saturations were monitored continuously. The                        Colonoscope was introduced through the mouth, and                        advanced to the second part of duodenum. The upper GI                        endoscopy was accomplished without difficulty. The                        patient tolerated the procedure well. Findings:      The examined esophagus was normal.      Multiple medium pedunculated and sessile polyps with no bleeding and no       stigmata of recent bleeding were found in the gastric body and in the       gastric antrum. Two of these were removed with hot snare. Resection and       retrieval were complete. Multiple ones left alone as they did not look       like bleeding locations.      The examined duodenum was normal. Impression:           - Normal esophagus.                       -  Multiple gastric polyps. Resected and retrieved.         - Normal examined duodenum. Recommendation:       - Await pathology results.                       - Perform a colonoscopy as previously scheduled. Manya Silvas, MD 02/11/2017 9:45:29 AM This report has been signed electronically. Number of Addenda: 0 Note Initiated On: 02/11/2017 9:26 AM      [facility]

## 2017-02-11 NOTE — H&P (Signed)
Primary Care Physician:  Albina Billet, MD Primary Gastroenterologist:  Dr. Vira Agar  Pre-Procedure History & Physical: HPI:  Raymond Antrim. is a 71 y.o. male is here for an colonoscopy and EGD.   Past Medical History:  Diagnosis Date  . Cancer ([place])   . Chronic kidney disease    renal cell carcinoma  . GERD (gastroesophageal reflux disease)   . History of colon polyps   . History of gastric polyp   . History of hiatal hernia   . Neuropathy   . Non Hodgkin's lymphoma ([place])   . PONV (postoperative nausea and vomiting)    "difficulty waking up after surgery"  . Reflux   . Renal mass     Past Surgical History:  Procedure Laterality Date  . APPENDECTOMY    . BONE MARROW TRANSPLANT    . CHOLECYSTECTOMY    . FRACTURE SURGERY    . GALLBLADDER SURGERY    . HERNIA REPAIR Left    Inguinal Hernia Repair  . ROBOTIC ASSITED PARTIAL NEPHRECTOMY Left 11/12/2015   Procedure: ROBOTIC ASSITED PARTIAL NEPHRECTOMY;  Surgeon: [name], MD;  Location: [facility] ORS;  Service: Urology;  Laterality: Left;  . TONSILLECTOMY      Prior to Admission medications   Medication Sig Start Date End Date Taking? Authorizing Provider  cholecalciferol (VITAMIN D) 1000 units tablet Take 1,000 Units by mouth daily.   Yes [provider]  Fe Bisgly-Succ-C-Thre-B12-FA (IRON-150 PO) Take 1 tablet by mouth daily.    Yes [provider]  magnesium oxide (MAG-OX) 400 MG tablet Take 400 mg by mouth daily.   Yes [provider]  omeprazole (PRILOSEC) 40 MG capsule Take 40 mg by mouth daily. Reported on 08/06/2015 10/29/13  Yes [provider]  polyethylene glycol powder (GLYCOLAX/MIRALAX) powder Take 1 Container by mouth once.   Yes [provider]  Probiotic Product (PROBIOTIC + OMEGA-3) CAPS Take 1 capsule by mouth daily.    Yes [provider]  vitamin B-12 (CYANOCOBALAMIN) 1000 MCG tablet Take 1,000 mcg by mouth daily.   Yes [provider]   FLUOCINOLONE ACETONIDE BODY 0.01 % OIL as needed.  05/16/15   [provider]  HYDROcodone-acetaminophen (NORCO) 5-325 MG tablet Take 1-2 tablets by mouth every 4 (four) hours as needed for moderate pain. 11/20/15   [name], MD  HYDROcodone-acetaminophen (NORCO/VICODIN) 5-325 MG tablet Take 1-2 tablets by mouth every 4 (four) hours as needed for moderate pain. 11/14/15   [name], MD    Allergies as of 08/11/2016  . (No Known Allergies)    History reviewed. No pertinent family history.  Social History   Socioeconomic History  . Marital status: Married    Spouse name: Not on file  . Number of children: Not on file  . Years of education: Not on file  . Highest education level: Not on file  Social Needs  . Financial resource strain: Not on file  . Food insecurity - worry: Not on file  . Food insecurity - inability: Not on file  . Transportation needs - medical: Not on file  . Transportation needs - non-medical: Not on file  Occupational History  . Not on file  Tobacco Use  . Smoking status: Never Smoker  . Smokeless tobacco: Never Used  Substance and Sexual Activity  . Alcohol use: No  . Drug use: No  . Sexual activity: Not on file  Other Topics Concern  . Not on file  Social History Narrative  . Not on file    Review of Systems: See HPI, otherwise negative ROS  Physical Exam: BP 126/72   Pulse 82   Temp 97.8 F (36.6 C) (Tympanic)   Resp (!) 22   Ht 5' 8.5" (1.74 m)   Wt 89.8 kg (198 lb)   SpO2 100%   BMI 29.67 kg/m  General:   Alert,  pleasant and cooperative in NAD Head:  Normocephalic and atraumatic. Neck:  Supple; no masses or thyromegaly. Lungs:  Clear throughout to auscultation.    Heart:  Regular rate and rhythm. Abdomen:  Soft, nontender and nondistended. Normal bowel sounds, without guarding, and without rebound.   Neurologic:  Alert and  oriented x4;  grossly normal neurologically.  Impression/Plan: Raymond Hock.  is here for an colonoscopy to be performed for [facility] colon polyps.  Also an EGD for follow up gastric polyps.  Risks, benefits, limitations, and alternatives regarding  endoscopy and colonoscopy have been reviewed with the patient.  Questions have been answered.  All parties agreeable.   Raymond Cheers, MD  02/11/2017, 9:21 AM

## 2017-02-11 NOTE — Anesthesia Post-op Follow-up Note (Signed)
Anesthesia QCDR form completed.        

## 2017-02-11 NOTE — Anesthesia Postprocedure Evaluation (Signed)
Anesthesia Post Note  Patient: Esiah Bazinet.  Procedure(s) Performed: COLONOSCOPY WITH PROPOFOL (N/A ) ESOPHAGOGASTRODUODENOSCOPY (EGD) WITH PROPOFOL (N/A )  Patient location during evaluation: Endoscopy Anesthesia Type: General Level of consciousness: awake and alert Pain management: pain level controlled Vital Signs Assessment: post-procedure vital signs reviewed and stable Respiratory status: spontaneous breathing, nonlabored ventilation, respiratory function stable and patient connected to nasal cannula oxygen Cardiovascular status: blood pressure returned to baseline and stable Postop Assessment: no apparent nausea or vomiting Anesthetic complications: no     Last Vitals:  Vitals:   02/11/17 0857 02/11/17 1038  BP: 126/72 109/68  Pulse: 82   Resp: (!) 22   Temp: 36.6 C (!) 35.8 C  SpO2: 100%     Last Pain:  Vitals:   02/11/17 1038  TempSrc: Tympanic                 Danford Tat S

## 2017-02-11 NOTE — OR Nursing (Signed)
4 polyp snares in descending colon and 1 polyp snare in sigmoid colon not retrieved

## 2017-02-14 ENCOUNTER — Encounter: Payer: Self-pay | Admitting: Unknown Physician Specialty

## 2017-02-14 LAB — SURGICAL PATHOLOGY

## 2017-05-24 IMAGING — CT CT ABD-PELV W/ CM
2 of 5 series · 17 of 46 positions shown, 19 images · IV contrast (iopamidol)
Comparison: 12/03/2010

CLINICAL DATA: Skin rashes since April 2014. Personal history of
non-Hodgkin's lymphoma 20 years ago.

EXAM:
CT ABDOMEN AND PELVIS WITH CONTRAST
TECHNIQUE: Multidetector CT imaging of the abdomen and pelvis was performed
using the standard protocol following bolus administration of
intravenous contrast.
CONTRAST:  85mL W0HVKN-LXX IOPAMIDOL (W0HVKN-LXX) INJECTION 61%

[Series 2: axial soft tissue · axial · 0.71mm/px · z∈[-865,-425]mm · 14 of 100 slices shown, 16 images]
[im 6/100  soft-tissue]
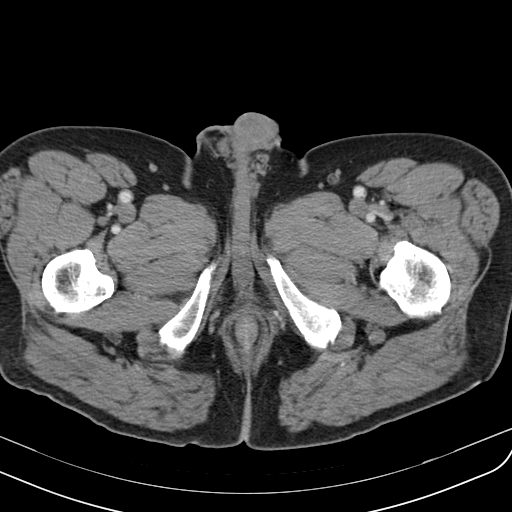
[im 6/100  bone]
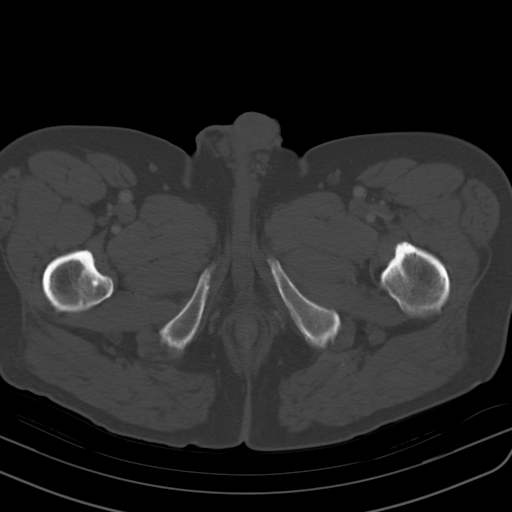
[im 11/100  soft-tissue]
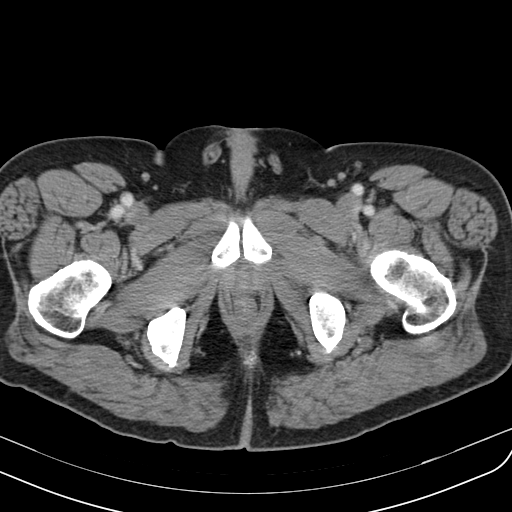
[im 21/100  soft-tissue]
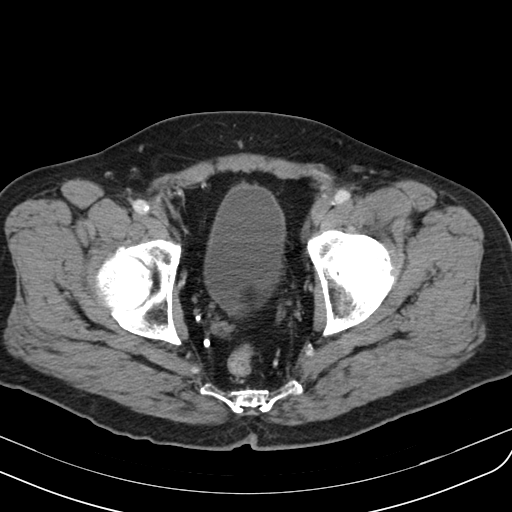
[im 27/100  soft-tissue]
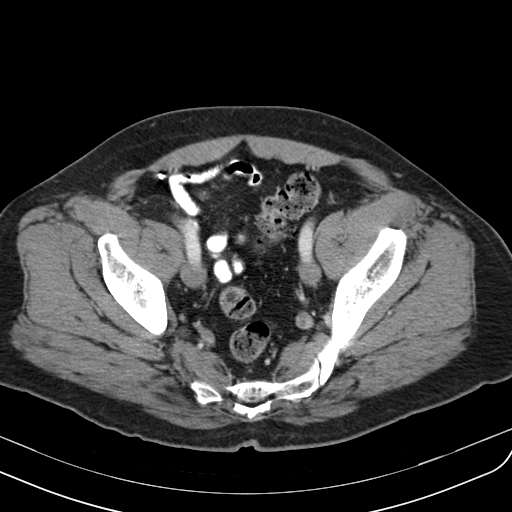
[im 32/100  soft-tissue]
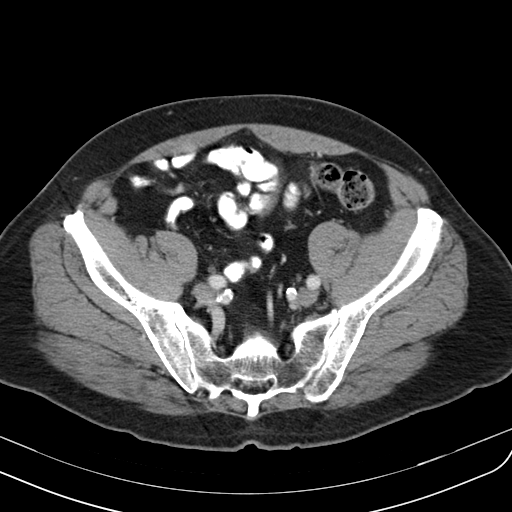
[im 42/100  soft-tissue]
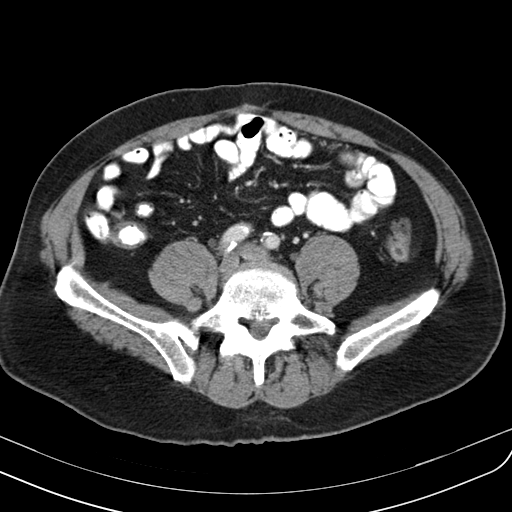
[im 47/100  soft-tissue]
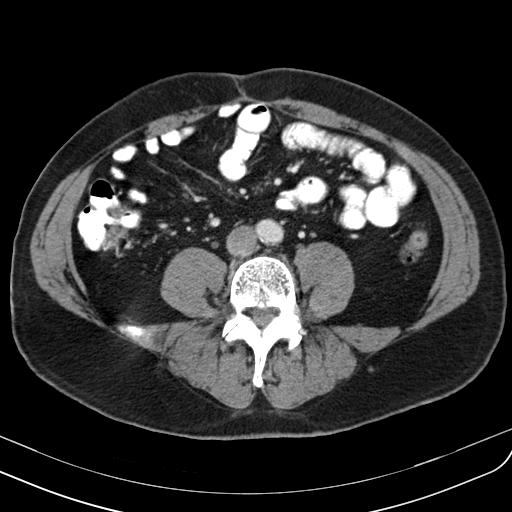
[im 53/100  soft-tissue]
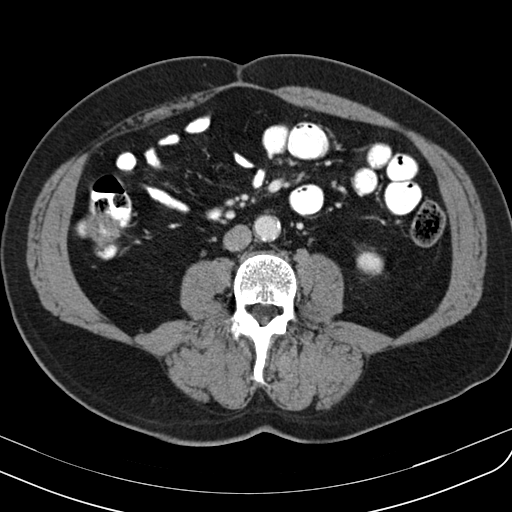
[im 58/100  soft-tissue]
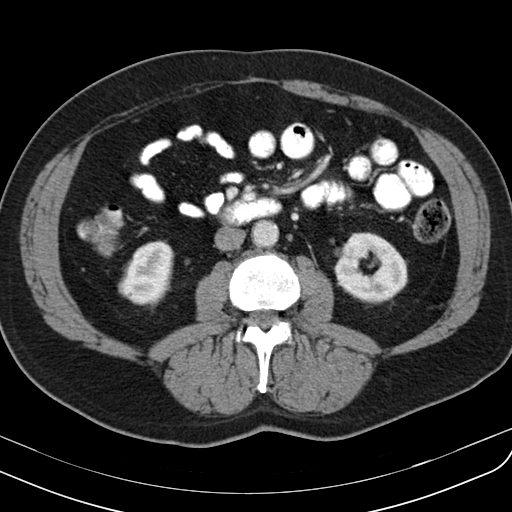
[im 58/100  bone]
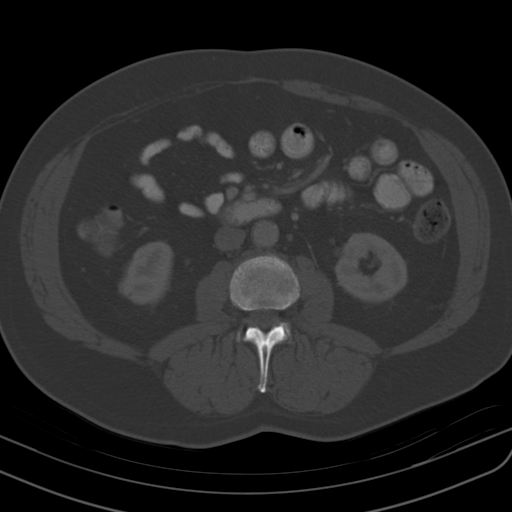
[im 68/100  soft-tissue]
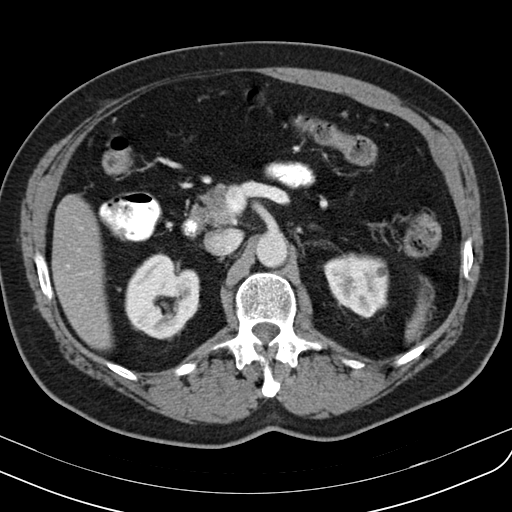
[im 73/100  soft-tissue]
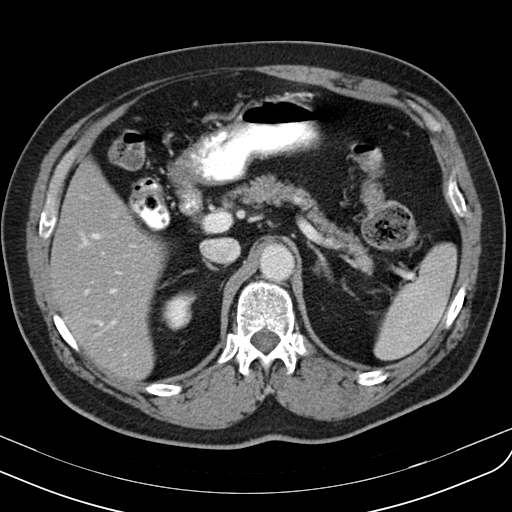
[im 79/100  soft-tissue]
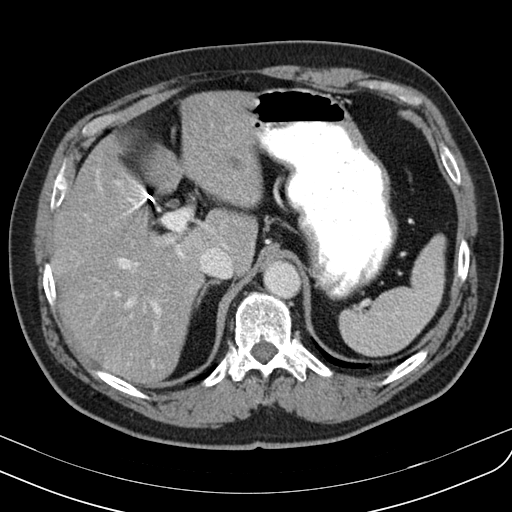
[im 89/100  soft-tissue]
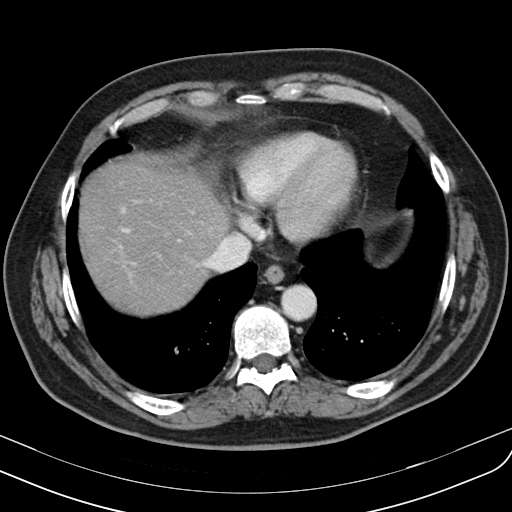
[im 94/100  soft-tissue]
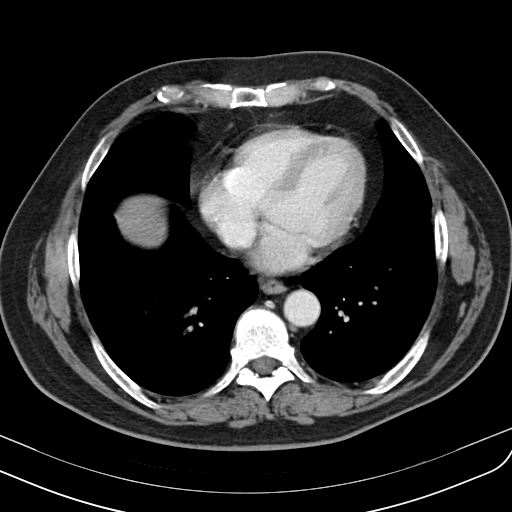

[Series 602: coronal · coronal · 0.96mm/px · 3 of 123 slices shown]
[im 41/123  soft-tissue]
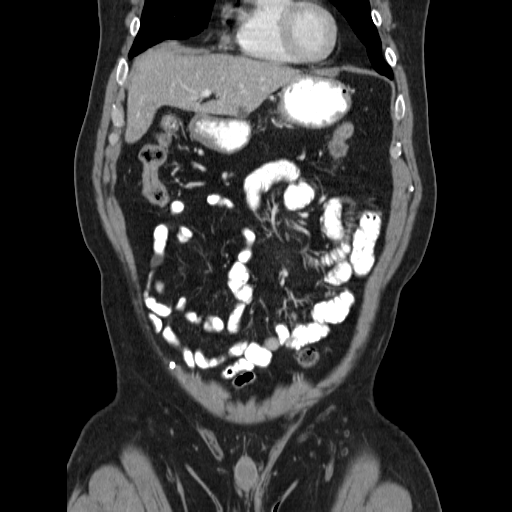
[im 55/123  soft-tissue]
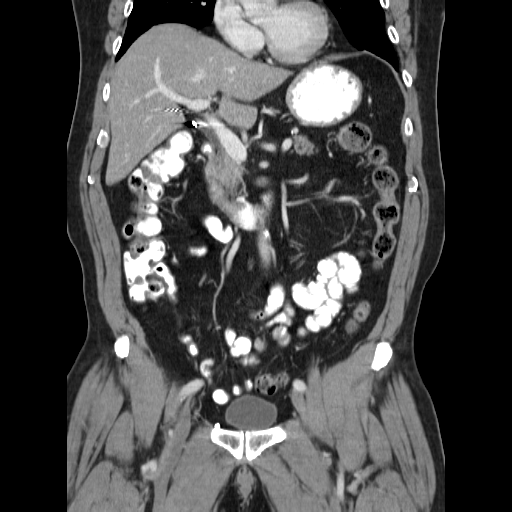
[im 68/123  soft-tissue]
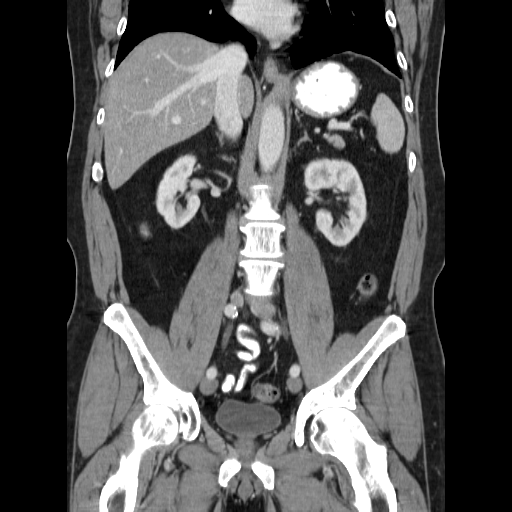

[17 of 46 positions shown; findings below may reference images not displayed]

FINDINGS: Lung bases are clear.  No pleural or pericardial fluid.

There is diffuse fatty change of the liver. 1 cm cyst at the
inferior margin of the lateral segment of the left lobe is
unchanged. Previous cholecystectomy. The spleen is normal. The
pancreas is normal. The adrenal glands are normal. The right kidney
is normal. The left kidney is normal except for a 6 mm cyst in the
upper pole and a 14 mm abnormality at the lower pole. This can be
seen in retrospect in 2882 when it measured 6 mm. This is
indeterminate but could be a neoplasm. The aorta and its branch
vessels show atherosclerosis. No aneurysm. The IVC is normal. No
retroperitoneal mass or lymphadenopathy. No free intraperitoneal
fluid or air. Previous abdominal wall surgery. Bladder, prostate
gland and seminal vesicles are unremarkable. No bowel pathology is
seen. There are ordinary degenerative changes of the lower lumbar
spine.
IMPRESSION: No evidence of recurrent lymphoma.

Chronic fatty change of the liver.  1 cm cyst in the left lobe.

Atherosclerosis of the aorta and its branch vessels.

14 mm abnormality at the lower pole of the kidney on the left which
could represent a mass. Because this is enlarging since the study of
2882, where it is visible only in retrospect as a 6 mm focus, MRI
with and without contrast is recommended.

## 2017-05-25 ENCOUNTER — Ambulatory Visit
Admission: RE | Admit: 2017-05-25 | Discharge: 2017-05-25 | Disposition: A | Discharge status: Home / Self Care | Payer: Medicare Other | Source: Ambulatory Visit | Attending: Internal Medicine | Admitting: Internal Medicine

## 2017-05-25 ENCOUNTER — Other Ambulatory Visit: Payer: Self-pay | Admitting: Internal Medicine

## 2017-05-25 DIAGNOSIS — Z8572 Personal history of non-Hodgkin lymphomas: Secondary | ICD-10-CM | POA: Diagnosis present

## 2017-05-25 DIAGNOSIS — R0789 Other chest pain: Secondary | ICD-10-CM

## 2017-08-29 ENCOUNTER — Other Ambulatory Visit: Payer: Self-pay | Admitting: Internal Medicine

## 2017-08-29 DIAGNOSIS — R1013 Epigastric pain: Secondary | ICD-10-CM

## 2017-09-02 ENCOUNTER — Ambulatory Visit
Admission: RE | Admit: 2017-09-02 | Discharge: 2017-09-02 | Disposition: A | Discharge status: Home / Self Care | Payer: Medicare Other | Source: Ambulatory Visit | Attending: Internal Medicine | Admitting: Internal Medicine

## 2017-09-02 DIAGNOSIS — R1013 Epigastric pain: Secondary | ICD-10-CM | POA: Insufficient documentation

## 2017-09-02 DIAGNOSIS — K760 Fatty (change of) liver, not elsewhere classified: Secondary | ICD-10-CM | POA: Diagnosis not present

## 2017-09-02 DIAGNOSIS — Z9049 Acquired absence of other specified parts of digestive tract: Secondary | ICD-10-CM | POA: Diagnosis not present

## 2017-09-02 DIAGNOSIS — K7689 Other specified diseases of liver: Secondary | ICD-10-CM | POA: Diagnosis not present

## 2018-03-01 ENCOUNTER — Encounter: Payer: Self-pay | Admitting: Emergency Medicine

## 2018-03-01 ENCOUNTER — Emergency Department: Payer: Medicare Other

## 2018-03-01 ENCOUNTER — Other Ambulatory Visit: Payer: Self-pay

## 2018-03-01 DIAGNOSIS — I452 Bifascicular block: Secondary | ICD-10-CM | POA: Insufficient documentation

## 2018-03-01 DIAGNOSIS — Z8571 Personal history of Hodgkin lymphoma: Secondary | ICD-10-CM | POA: Insufficient documentation

## 2018-03-01 DIAGNOSIS — Z85528 Personal history of other malignant neoplasm of kidney: Secondary | ICD-10-CM | POA: Diagnosis not present

## 2018-03-01 DIAGNOSIS — B349 Viral infection, unspecified: Secondary | ICD-10-CM | POA: Diagnosis not present

## 2018-03-01 DIAGNOSIS — R05 Cough: Secondary | ICD-10-CM | POA: Diagnosis present

## 2018-03-01 DIAGNOSIS — Z79899 Other long term (current) drug therapy: Secondary | ICD-10-CM | POA: Insufficient documentation

## 2018-03-01 DIAGNOSIS — E860 Dehydration: Secondary | ICD-10-CM | POA: Insufficient documentation

## 2018-03-01 LAB — CBC
HCT: 41.7 % (ref 39.0–52.0)
Hemoglobin: 14.3 g/dL (ref 13.0–17.0)
MCH: 32.5 pg (ref 26.0–34.0)
MCHC: 34.3 g/dL (ref 30.0–36.0)
MCV: 94.8 fL (ref 80.0–100.0)
Platelets: 166 10*3/uL (ref 150–400)
RBC: 4.4 MIL/uL (ref 4.22–5.81)
RDW: 13.1 % (ref 11.5–15.5)
WBC: 10 10*3/uL (ref 4.0–10.5)
nRBC: 0 % (ref 0.0–0.2)

## 2018-03-01 LAB — TROPONIN I: Troponin I: <0.03 ng/mL (ref <0.03)

## 2018-03-01 LAB — BASIC METABOLIC PANEL
Anion gap: 11 (ref 5–15)
BUN: 21 mg/dL (ref 8–23)
CO2: 22 mmol/L (ref 22–32)
Calcium: 8.7 mg/dL — ABNORMAL LOW (ref 8.9–10.3)
Chloride: 102 mmol/L (ref 98–111)
Creatinine, Ser: 1.46 mg/dL — ABNORMAL HIGH (ref 0.61–1.24)
GFR calc Af Amer: 54 mL/min — ABNORMAL LOW (ref >60)
GFR calc non Af Amer: 47 mL/min — ABNORMAL LOW (ref >60)
GLUCOSE: 141 mg/dL — AB (ref 70–99)
Potassium: 3.7 mmol/L (ref 3.5–5.1)
Sodium: 135 mmol/L (ref 135–145)

## 2018-03-01 NOTE — ED Triage Notes (Signed)
Pt arrived with complaints of productive cough and shortness of breath that started today. Pt states he took 325 mg of tylenol at 1850. Pt in NAD

## 2018-03-02 ENCOUNTER — Emergency Department
Admission: EM | Admit: 2018-03-02 | Discharge: 2018-03-02 | Disposition: A | Discharge status: Home / Self Care | Payer: Medicare Other | Attending: Emergency Medicine | Admitting: Emergency Medicine

## 2018-03-02 DIAGNOSIS — E860 Dehydration: Secondary | ICD-10-CM

## 2018-03-02 DIAGNOSIS — B349 Viral infection, unspecified: Secondary | ICD-10-CM

## 2018-03-02 DIAGNOSIS — I452 Bifascicular block: Secondary | ICD-10-CM

## 2018-03-02 MED ORDER — OSELTAMIVIR PHOSPHATE 75 MG PO CAPS
75.0000 mg | ORAL_CAPSULE | Freq: Once | ORAL | Status: AC
  Administered 2018-03-02: 75 mg via ORAL
  Filled 2018-03-02: qty 1

## 2018-03-02 MED ORDER — ONDANSETRON 4 MG PO TBDP: 4.0000 mg | ORAL_TABLET | Freq: Three times a day (TID) | ORAL | 0 refills | Status: DC | PRN

## 2018-03-02 MED ORDER — SODIUM CHLORIDE 0.9 % IV BOLUS
1000.0000 mL | Freq: Once | INTRAVENOUS | Status: AC
  Administered 2018-03-02: 1000 mL via INTRAVENOUS

## 2018-03-02 MED ORDER — OSELTAMIVIR PHOSPHATE 75 MG PO CAPS: 75.0000 mg | ORAL_CAPSULE | Freq: Two times a day (BID) | ORAL | 0 refills | Status: AC

## 2018-03-02 MED ORDER — ACETAMINOPHEN 500 MG PO TABS
1000.0000 mg | ORAL_TABLET | Freq: Once | ORAL | Status: AC
  Administered 2018-03-02: 1000 mg via ORAL
  Filled 2018-03-02: qty 2

## 2018-03-02 MED ORDER — IBUPROFEN 600 MG PO TABS: 600.0000 mg | ORAL_TABLET | Freq: Three times a day (TID) | ORAL | 0 refills | Status: DC | PRN

## 2018-03-02 MED ORDER — KETOROLAC TROMETHAMINE 30 MG/ML IJ SOLN
15.0000 mg | Freq: Once | INTRAMUSCULAR | Status: AC
  Administered 2018-03-02: 15 mg via INTRAVENOUS
  Filled 2018-03-02: qty 1

## 2018-03-02 MED ORDER — OXYMETAZOLINE HCL 0.05 % NA SOLN
1.0000 | Freq: Once | NASAL | Status: AC
  Administered 2018-03-02: 1 via NASAL
  Filled 2018-03-02 (×2): qty 15

## 2018-03-02 MED ORDER — DEXAMETHASONE SODIUM PHOSPHATE 10 MG/ML IJ SOLN
10.0000 mg | Freq: Once | INTRAMUSCULAR | Status: AC
  Administered 2018-03-02: 10 mg via INTRAVENOUS
  Filled 2018-03-02: qty 1

## 2018-03-02 NOTE — ED Provider Notes (Signed)
[facility] Emergency Department Provider Note  ____________________________________________   First MD Initiated Contact with Patient 03/02/18 [phone]     (approximate)  I have reviewed the triage vital signs and the nursing notes.   HISTORY  Chief Complaint Cough and Shortness of Breath   HPI Raymond Bennett. is a 72 y.o. male who self presents to the emergency department with shortness of breath and productive cough that began about 12 hours ago.  His symptoms came on gradually have been slowly progressive and are now moderate in severity.  They are somewhat worse with exertion and somewhat improved with rest.   He reports fevers but no chills.  He took Tylenol prior to coming with minimal relief.  He also reports nasal congestion and mild sore throat.  He has several sick contacts.   Past Medical History:  Diagnosis Date  . Cancer ([place])   . Chronic kidney disease    renal cell carcinoma  . GERD (gastroesophageal reflux disease)   . History of colon polyps   . History of gastric polyp   . History of hiatal hernia   . Neuropathy   . Non Hodgkin's lymphoma ([place])   . PONV (postoperative nausea and vomiting)    "difficulty waking up after surgery"  . Reflux   . Renal mass     Patient Active Problem List   Diagnosis Date Noted  . Renal mass 11/12/2015    Past Surgical History:  Procedure Laterality Date  . APPENDECTOMY    . BONE MARROW TRANSPLANT    . CHOLECYSTECTOMY    . COLONOSCOPY WITH PROPOFOL N/A 02/11/2017   Procedure: COLONOSCOPY WITH PROPOFOL;  Surgeon: Manya Silvas, MD;  Location: [facility] ENDOSCOPY;  Service: Endoscopy;  Laterality: N/A;  . ESOPHAGOGASTRODUODENOSCOPY (EGD) WITH PROPOFOL N/A 02/11/2017   Procedure: ESOPHAGOGASTRODUODENOSCOPY (EGD) WITH PROPOFOL;  Surgeon: Manya Silvas, MD;  Location: [facility] ENDOSCOPY;  Service: Endoscopy;  Laterality: N/A;  . FRACTURE SURGERY    . GALLBLADDER SURGERY    . HERNIA REPAIR Left    Inguinal Hernia Repair  . ROBOTIC ASSITED PARTIAL NEPHRECTOMY Left 11/12/2015   Procedure: ROBOTIC ASSITED PARTIAL NEPHRECTOMY;  Surgeon: [name], MD;  Location: [facility] ORS;  Service: Urology;  Laterality: Left;  . TONSILLECTOMY      Prior to Admission medications   Medication Sig Start Date End Date Taking? Authorizing Provider  cholecalciferol (VITAMIN D) 1000 units tablet Take 1,000 Units by mouth daily.    [provider]  Fe Bisgly-Succ-C-Thre-B12-FA (IRON-150 PO) Take 1 tablet by mouth daily.     [provider]  FLUOCINOLONE ACETONIDE BODY 0.01 % OIL as needed.  05/16/15   [provider]  HYDROcodone-acetaminophen (NORCO) 5-325 MG tablet Take 1-2 tablets by mouth every 4 (four) hours as needed for moderate pain. 11/20/15   [name], MD  HYDROcodone-acetaminophen (NORCO/VICODIN) 5-325 MG tablet Take 1-2 tablets by mouth every 4 (four) hours as needed for moderate pain. 11/14/15   [name], MD  ibuprofen (ADVIL,MOTRIN) 600 MG tablet Take 1 tablet (600 mg total) by mouth every 8 (eight) hours as needed. 03/02/18   Darel Hong, MD  magnesium oxide (MAG-OX) 400 MG tablet Take 400 mg by mouth daily.    [provider]  omeprazole (PRILOSEC) 40 MG capsule Take 40 mg by mouth daily. Reported on 08/06/2015 10/29/13   [provider]  ondansetron (ZOFRAN ODT) 4 MG disintegrating tablet Take 1 tablet (4 mg total) by mouth  every 8 (eight) hours as needed for nausea or vomiting. 03/02/18   Darel Hong, MD  oseltamivir (TAMIFLU) 75 MG capsule Take 1 capsule (75 mg total) by mouth 2 (two) times daily for 5 days. 03/02/18 03/07/18  Darel Hong, MD  polyethylene glycol powder (GLYCOLAX/MIRALAX) powder Take 1 Container by mouth once.    [provider]  Probiotic Product (PROBIOTIC + OMEGA-3) CAPS Take 1 capsule by mouth daily.     [provider]  vitamin B-12 (CYANOCOBALAMIN) 1000 MCG tablet Take 1,000 mcg  by mouth daily.    [provider]    Allergies Patient has no known allergies.  No family history on file.  Social History Social History   Tobacco Use  . Smoking status: Never Smoker  . Smokeless tobacco: Never Used  Substance Use Topics  . Alcohol use: No  . Drug use: No    Review of Systems Constitutional: Positive for fevers Eyes: No visual changes. ENT: Positive for sore throat. Cardiovascular: Positive for chest pain. Respiratory: Positive for cough Gastrointestinal: No abdominal pain.  No nausea, no vomiting.  No diarrhea.  No constipation. Genitourinary: Negative for dysuria. Musculoskeletal: Negative for back pain. Skin: Negative for rash. Neurological: Positive for headache negative for focal numbness or weakness   ____________________________________________   PHYSICAL EXAM:  VITAL SIGNS: ED Triage Vitals  Enc Vitals Group     BP 03/01/18 1924 (!) 150/54     Pulse Rate 03/01/18 1924 (!) 56     Resp 03/01/18 1924 14     Temp 03/01/18 1924 100 F (37.8 C)     Temp Source 03/01/18 1924 Oral     SpO2 03/01/18 1924 94 %     Weight 03/01/18 1929 197 lb (89.4 kg)     Height 03/01/18 1929 5\' 8"  (1.727 m)     Head Circumference --      Peak Flow --      Pain Score 03/01/18 1929 0     Pain Loc --      Pain Edu? --      Excl. in [place]? --     Constitutional: Alert and oriented x4 nontoxic no diaphoresis speaks in full clear sentences Eyes: PERRL EOMI. Head: Atraumatic. Nose: Positive for congestion Mouth/Throat: No trismus uvula midline no pharyngeal erythema or exudate Neck: No stridor.  Able to lie completely flat with no JVD Cardiovascular: Normal rate, regular rhythm. Grossly normal heart sounds.  Good peripheral circulation. Respiratory: Slightly increased respiratory effort with mild crackles throughout Gastrointestinal: Soft nontender Musculoskeletal: No lower extremity edema   Neurologic:  Normal speech and language. No gross focal  neurologic deficits are appreciated. Skin:  Skin is warm, dry and intact. No rash noted. Psychiatric: Mood and affect are normal. Speech and behavior are normal.    ____________________________________________   DIFFERENTIAL includes but not limited to  Upper respiratory tract infection, bronchitis, viral syndrome, pneumonia, influenza ____________________________________________   LABS (all labs ordered are listed, but only abnormal results are displayed)  Labs Reviewed  BASIC METABOLIC PANEL - Abnormal; Notable for the following components:      Result Value   Glucose, Bld 141 (*)    Creatinine, Ser 1.46 (*)    Calcium 8.7 (*)    GFR calc non Af Amer 47 (*)    GFR calc Af Amer 54 (*)    All other components within normal limits  CBC  TROPONIN I    Lab work reviewed by me with no signs of acute  ischemia.  He does have slight bump in his baseline troponin __________________________________________  EKG  ED ECG REPORT I, Darel Hong, the attending physician, personally viewed and interpreted this ECG.  Date: 03/02/2018 EKG Time:  Rate: 55 Rhythm: Sinus bradycardia QRS Axis: normal Intervals: normal ST/T Wave abnormalities: Bifascicular block with anticipated repolarization abnormalities Narrative Interpretation: no evidence of acute ischemia  ____________________________________________  RADIOLOGY  Chest x-ray reviewed by me with no acute disease noted ____________________________________________   PROCEDURES  Procedure(s) performed: no  Procedures  Critical Care performed: no  ____________________________________________   INITIAL IMPRESSION / ASSESSMENT AND PLAN / ED COURSE  Pertinent labs & imaging results that were available during my care of the patient were reviewed by me and considered in my medical decision making (see chart for details).   As part of my medical decision making, I reviewed the following data within the electronic medical  record:  History obtained from family if available, nursing notes, old chart and ekg, as well as notes from prior ED visits.      ----------------------------------------- 1:24 AM on 03/02/2018 -----------------------------------------  The patient is clinically somewhat dehydrated with fever to 103 degrees at home and generalized myalgias.  As we are in an influenza epidemic there is no indication for testing at this point as clinically he most likely has influenza.  I discussed with him the risks and benefits of Tamiflu and together we agreed that given his comorbidities he would likely benefit from treatment.  First dose given here today along with ketorolac and Tylenol for myalgias and fever as well as Afrin for congestion and dexamethasone to help with the sore throat and swelling.  I will prescribe him a total of 5 days of Tamiflu along with Zofran in case it makes him nauseated.  Strict return precautions have been given.  ____________________________________________   FINAL CLINICAL IMPRESSION(S) / ED DIAGNOSES  Final diagnoses:  Viral syndrome  Dehydration  Bifascicular block      NEW MEDICATIONS STARTED DURING THIS VISIT:  Discharge Medication List as of 03/02/2018  1:27 AM    START taking these medications   Details  ibuprofen (ADVIL,MOTRIN) 600 MG tablet Take 1 tablet (600 mg total) by mouth every 8 (eight) hours as needed., Starting Thu 03/02/2018, Print    ondansetron (ZOFRAN ODT) 4 MG disintegrating tablet Take 1 tablet (4 mg total) by mouth every 8 (eight) hours as needed for nausea or vomiting., Starting Thu 03/02/2018, Print    oseltamivir (TAMIFLU) 75 MG capsule Take 1 capsule (75 mg total) by mouth 2 (two) times daily for 5 days., Starting Thu 03/02/2018, Until Tue 03/07/2018, Print         Note:  This document was prepared using Dragon voice recognition software and may include unintentional dictation errors.    Darel Hong, MD 03/06/18 [id number]

## 2018-03-02 NOTE — Discharge Instructions (Signed)
Fortunately today your chest x-ray was reassuring and you do not need antibiotics.  You are a little bit dehydrated so please use Zofran as needed for any nausea and continue lots of small sips of fluids throughout the day.  Please keep your appointment with the cardiologist this coming week for recheck and return to the emergency department sooner for any concerns whatsoever.  It was a pleasure to take care of you today, and thank you for coming to our emergency department.  If you have any questions or concerns before leaving please ask the nurse to grab me and I'm more than happy to go through your aftercare instructions again.  If you were prescribed any opioid pain medication today such as Norco, Vicodin, Percocet, morphine, hydrocodone, or oxycodone please make sure you do not drive when you are taking this medication as it can alter your ability to drive safely.  If you have any concerns once you are home that you are not improving or are in fact getting worse before you can make it to your follow-up appointment, please do not hesitate to call 911 and come back for further evaluation.  Darel Hong, MD  Results for orders placed or performed during the hospital encounter of 56/81/27  Basic metabolic panel  Result Value Ref Range   Sodium 135 135 - 145 mmol/L   Potassium 3.7 3.5 - 5.1 mmol/L   Chloride 102 98 - 111 mmol/L   CO2 22 22 - 32 mmol/L   Glucose, Bld 141 (H) 70 - 99 mg/dL   BUN 21 8 - 23 mg/dL   Creatinine, Ser 1.46 (H) 0.61 - 1.24 mg/dL   Calcium 8.7 (L) 8.9 - 10.3 mg/dL   GFR calc non Af Amer 47 (L) >60 mL/min   GFR calc Af Amer 54 (L) >60 mL/min   Anion gap 11 5 - 15  CBC  Result Value Ref Range   WBC 10.0 4.0 - 10.5 K/uL   RBC 4.40 4.22 - 5.81 MIL/uL   Hemoglobin 14.3 13.0 - 17.0 g/dL   HCT 41.7 39.0 - 52.0 %   MCV 94.8 80.0 - 100.0 fL   MCH 32.5 26.0 - 34.0 pg   MCHC 34.3 30.0 - 36.0 g/dL   RDW 13.1 11.5 - 15.5 %   Platelets 166 150 - 400 K/uL   nRBC 0.0 0.0  - 0.2 %  Troponin I - ONCE - STAT  Result Value Ref Range   Troponin I <0.03 <0.03 ng/mL   Dg Chest 2 View  Result Date: 03/01/2018 CLINICAL DATA:  Productive cough and shortness of breath beginning today. EXAM: CHEST - 2 VIEW COMPARISON:  05/25/2017 FINDINGS: Lungs are adequately inflated without focal airspace consolidation or effusion. Cardiomediastinal silhouette and remainder of the exam is unchanged. IMPRESSION: No active cardiopulmonary disease. Electronically Signed   By: Marin Olp M.D.   On: 03/01/2018 19:56

## 2018-03-09 DIAGNOSIS — R0602 Shortness of breath: Secondary | ICD-10-CM | POA: Insufficient documentation

## 2018-03-09 DIAGNOSIS — N183 Chronic kidney disease, stage 3 unspecified: Secondary | ICD-10-CM | POA: Insufficient documentation

## 2018-03-09 DIAGNOSIS — I441 Atrioventricular block, second degree: Secondary | ICD-10-CM | POA: Insufficient documentation

## 2018-03-09 HISTORY — DX: Shortness of breath: R06.02

## 2018-04-12 DIAGNOSIS — I428 Other cardiomyopathies: Secondary | ICD-10-CM | POA: Insufficient documentation

## 2018-04-12 DIAGNOSIS — I429 Cardiomyopathy, unspecified: Secondary | ICD-10-CM | POA: Insufficient documentation

## 2018-09-12 DIAGNOSIS — R42 Dizziness and giddiness: Secondary | ICD-10-CM | POA: Insufficient documentation

## 2019-03-15 DIAGNOSIS — I700 Atherosclerosis of aorta: Secondary | ICD-10-CM | POA: Insufficient documentation

## 2019-03-28 ENCOUNTER — Ambulatory Visit: Payer: Medicare PPO | Attending: Internal Medicine

## 2019-03-28 DIAGNOSIS — Z20822 Contact with and (suspected) exposure to covid-19: Secondary | ICD-10-CM

## 2019-03-29 LAB — NOVEL CORONAVIRUS, NAA: SARS-CoV-2, NAA: NOT DETECTED

## 2019-08-15 ENCOUNTER — Ambulatory Visit: Payer: Medicare PPO | Admitting: Dermatology

## 2019-08-15 ENCOUNTER — Other Ambulatory Visit: Payer: Self-pay

## 2019-08-15 DIAGNOSIS — D1801 Hemangioma of skin and subcutaneous tissue: Secondary | ICD-10-CM | POA: Diagnosis not present

## 2019-08-15 DIAGNOSIS — L570 Actinic keratosis: Secondary | ICD-10-CM

## 2019-08-15 DIAGNOSIS — L820 Inflamed seborrheic keratosis: Secondary | ICD-10-CM | POA: Diagnosis not present

## 2019-08-15 DIAGNOSIS — L578 Other skin changes due to chronic exposure to nonionizing radiation: Secondary | ICD-10-CM

## 2019-08-15 DIAGNOSIS — Z1283 Encounter for screening for malignant neoplasm of skin: Secondary | ICD-10-CM

## 2019-08-15 DIAGNOSIS — L80 Vitiligo: Secondary | ICD-10-CM

## 2019-08-15 DIAGNOSIS — Z85828 Personal history of other malignant neoplasm of skin: Secondary | ICD-10-CM | POA: Diagnosis not present

## 2019-08-15 DIAGNOSIS — L814 Other melanin hyperpigmentation: Secondary | ICD-10-CM

## 2019-08-15 DIAGNOSIS — D229 Melanocytic nevi, unspecified: Secondary | ICD-10-CM

## 2019-08-15 DIAGNOSIS — L821 Other seborrheic keratosis: Secondary | ICD-10-CM

## 2019-08-15 DIAGNOSIS — D485 Neoplasm of uncertain behavior of skin: Secondary | ICD-10-CM

## 2019-08-15 DIAGNOSIS — C44519 Basal cell carcinoma of skin of other part of trunk: Secondary | ICD-10-CM

## 2019-08-15 NOTE — Patient Instructions (Signed)

## 2019-08-15 NOTE — Progress Notes (Signed)
Follow-Up Visit   Subjective  Raymond Bennett. is a 74 y.o. male who presents for the following: Other (UBSE today. History of multiple BCC. Has not noticed anything new or changing.).  The patient presents for Upper Body Skin Exam (UBSE) for skin cancer screening and mole check.   The following portions of the chart were reviewed this encounter and updated as appropriate:  Tobacco  Allergies  Meds  Problems  Med Hx  Surg Hx  Fam Hx      Review of Systems:  No other skin or systemic complaints except as noted in HPI or Assessment and Plan.  Objective  Well appearing patient in no apparent distress; mood and affect are within normal limits.  All skin waist up examined.  Objective  Right chest medial infraclavicular: 1.0 cm pearly pink papule.  Objective  Left chest parasternal: 0.6 cm pearly pink papule.  Objective  Back, legs: Depigmented patches.  Objective  Head - Anterior (Face) (5): Erythematous thin papules/macules with gritty scale.   Objective  Head - Anterior (Face) (3): Erythematous keratotic or waxy stuck-on papule or plaque.    Assessment & Plan    History of Basal Cell Carcinoma of the Skin - No evidence of recurrence today - Recommend regular full body skin exams - Recommend daily broad spectrum sunscreen SPF 30+ to sun-exposed areas, reapply every 2 hours as needed.  - Call if any new or changing lesions are noted between office visits   Skin cancer screening performed today.  Actinic Damage - diffuse scaly erythematous macules with underlying dyspigmentation - Recommend daily broad spectrum sunscreen SPF 30+ to sun-exposed areas, reapply every 2 hours as needed.  - Call for new or changing lesions.  Lentigines - Scattered tan macules - Discussed due to sun exposure - Benign, observe - Call for any changes  Seborrheic Keratoses - Stuck-on, waxy, tan-brown papules and plaques  - Discussed benign etiology and prognosis. -  Observe - Call for any changes  Hemangiomas - Red papules - Discussed benign nature - Observe - Call for any changes  Melanocytic Nevi - Tan-brown and/or pink-flesh-colored symmetric macules and papules - Benign appearing on exam today - Observation - Call clinic for new or changing moles - Recommend daily use of broad spectrum spf 30+ sunscreen to sun-exposed areas.    Neoplasm of uncertain behavior of skin (2) Right chest medial infraclavicular  Epidermal / dermal shaving  Lesion length (cm):  1 Lesion width (cm):  1 Margin per side (cm):  0.2 Total excision diameter (cm):  1.4 Informed consent: discussed and consent obtained   Timeout: patient name, date of birth, surgical site, and procedure verified   Procedure prep:  Patient was prepped and draped in usual sterile fashion Prep type:  Isopropyl alcohol Anesthesia: the lesion was anesthetized in a standard fashion   Anesthetic:  1% lidocaine w/ epinephrine 1-100,000 buffered w/ 8.4% NaHCO3 Instrument used: flexible razor blade   Hemostasis achieved with: pressure, aluminum chloride and electrodesiccation   Outcome: patient tolerated procedure well   Post-procedure details: sterile dressing applied and wound care instructions given   Dressing type: bandage and petrolatum    Destruction of lesion Complexity: extensive   Destruction method: electrodesiccation and curettage   Informed consent: discussed and consent obtained   Timeout:  patient name, date of birth, surgical site, and procedure verified Procedure prep:  Patient was prepped and draped in usual sterile fashion Prep type:  Isopropyl alcohol Anesthesia: the lesion was anesthetized  in a standard fashion   Anesthetic:  1% lidocaine w/ epinephrine 1-100,000 buffered w/ 8.4% NaHCO3 Curettage performed in three different directions: Yes   Electrodesiccation performed over the curetted area: Yes   Lesion length (cm):  1 Lesion width (cm):  1 Margin per side  (cm):  0.2 Final wound size (cm):  1.4 Hemostasis achieved with:  pressure and aluminum chloride Outcome: patient tolerated procedure well with no complications   Post-procedure details: sterile dressing applied and wound care instructions given   Dressing type: bandage and petrolatum    Specimen 1 - Surgical pathology Differential Diagnosis: BCC vs other  Check Margins: No 1.0 cm pearly pink papule.  Left chest parasternal  Epidermal / dermal shaving  Lesion length (cm):  0.6 Lesion width (cm):  0.6 Margin per side (cm):  0.2 Total excision diameter (cm):  1 Informed consent: discussed and consent obtained   Timeout: patient name, date of birth, surgical site, and procedure verified   Procedure prep:  Patient was prepped and draped in usual sterile fashion Prep type:  Isopropyl alcohol Anesthesia: the lesion was anesthetized in a standard fashion   Anesthetic:  1% lidocaine w/ epinephrine 1-100,000 buffered w/ 8.4% NaHCO3 Instrument used: flexible razor blade   Hemostasis achieved with: pressure, aluminum chloride and electrodesiccation   Outcome: patient tolerated procedure well   Post-procedure details: sterile dressing applied and wound care instructions given   Dressing type: bandage and petrolatum    Destruction of lesion Complexity: extensive   Destruction method: electrodesiccation and curettage   Informed consent: discussed and consent obtained   Timeout:  patient name, date of birth, surgical site, and procedure verified Procedure prep:  Patient was prepped and draped in usual sterile fashion Prep type:  Isopropyl alcohol Anesthesia: the lesion was anesthetized in a standard fashion   Anesthetic:  1% lidocaine w/ epinephrine 1-100,000 buffered w/ 8.4% NaHCO3 Curettage performed in three different directions: Yes   Electrodesiccation performed over the curetted area: Yes   Lesion length (cm):  0.6 Lesion width (cm):  0.6 Margin per side (cm):  0.2 Final wound  size (cm):  1 Hemostasis achieved with:  pressure and aluminum chloride Outcome: patient tolerated procedure well with no complications   Post-procedure details: sterile dressing applied and wound care instructions given   Dressing type: bandage and petrolatum    Specimen 2 - Surgical pathology Differential Diagnosis: BCC vs other  Check Margins: No 0.6 cm pearly pink papule.  Vitiligo Back, legs  Benign. No treatment at this time.  AK (actinic keratosis) (5) Head - Anterior (Face)  Destruction of lesion - Head - Anterior (Face) Complexity: simple   Destruction method: cryotherapy   Informed consent: discussed and consent obtained   Timeout:  patient name, date of birth, surgical site, and procedure verified Lesion destroyed using liquid nitrogen: Yes   Region frozen until ice ball extended beyond lesion: Yes   Outcome: patient tolerated procedure well with no complications   Post-procedure details: wound care instructions given    Inflamed seborrheic keratosis (3) Head - Anterior (Face)  Destruction of lesion - Head - Anterior (Face) Complexity: simple   Destruction method: cryotherapy   Informed consent: discussed and consent obtained   Timeout:  patient name, date of birth, surgical site, and procedure verified Lesion destroyed using liquid nitrogen: Yes   Region frozen until ice ball extended beyond lesion: Yes   Outcome: patient tolerated procedure well with no complications   Post-procedure details: wound care instructions given  BCC (basal cell carcinoma), chest  Return in about 6 months (around 02/14/2020).   I, Ashok Cordia, CMA, am acting as scribe for Sarina Ser, MD .  Documentation: I have reviewed the above documentation for accuracy and completeness, and I agree with the above.  Sarina Ser, MD

## 2019-08-20 ENCOUNTER — Telehealth: Payer: Self-pay

## 2019-08-20 NOTE — Telephone Encounter (Signed)
Biopsy results discussed with pt  

## 2019-08-21 ENCOUNTER — Encounter: Payer: Self-pay | Admitting: Dermatology

## 2019-09-20 DIAGNOSIS — I440 Atrioventricular block, first degree: Secondary | ICD-10-CM | POA: Insufficient documentation

## 2019-09-20 HISTORY — DX: Atrioventricular block, first degree: I44.0

## 2019-11-07 ENCOUNTER — Other Ambulatory Visit: Payer: Medicare PPO

## 2019-11-07 ENCOUNTER — Other Ambulatory Visit: Payer: Self-pay | Admitting: Critical Care Medicine

## 2019-11-07 DIAGNOSIS — Z20822 Contact with and (suspected) exposure to covid-19: Secondary | ICD-10-CM

## 2019-11-09 LAB — NOVEL CORONAVIRUS, NAA: SARS-CoV-2, NAA: NOT DETECTED

## 2019-12-25 IMAGING — CR DG CHEST 2V
1 series · 2 of 2 positions shown · non-contrast
Comparison: 05/25/2017

CLINICAL DATA: Productive cough and shortness of breath beginning
today.

EXAM:
CHEST - 2 VIEW

[Series 1: dg chest 2 view · 0.14mm/px · 2 of 2 slices shown]
[im 1/2]
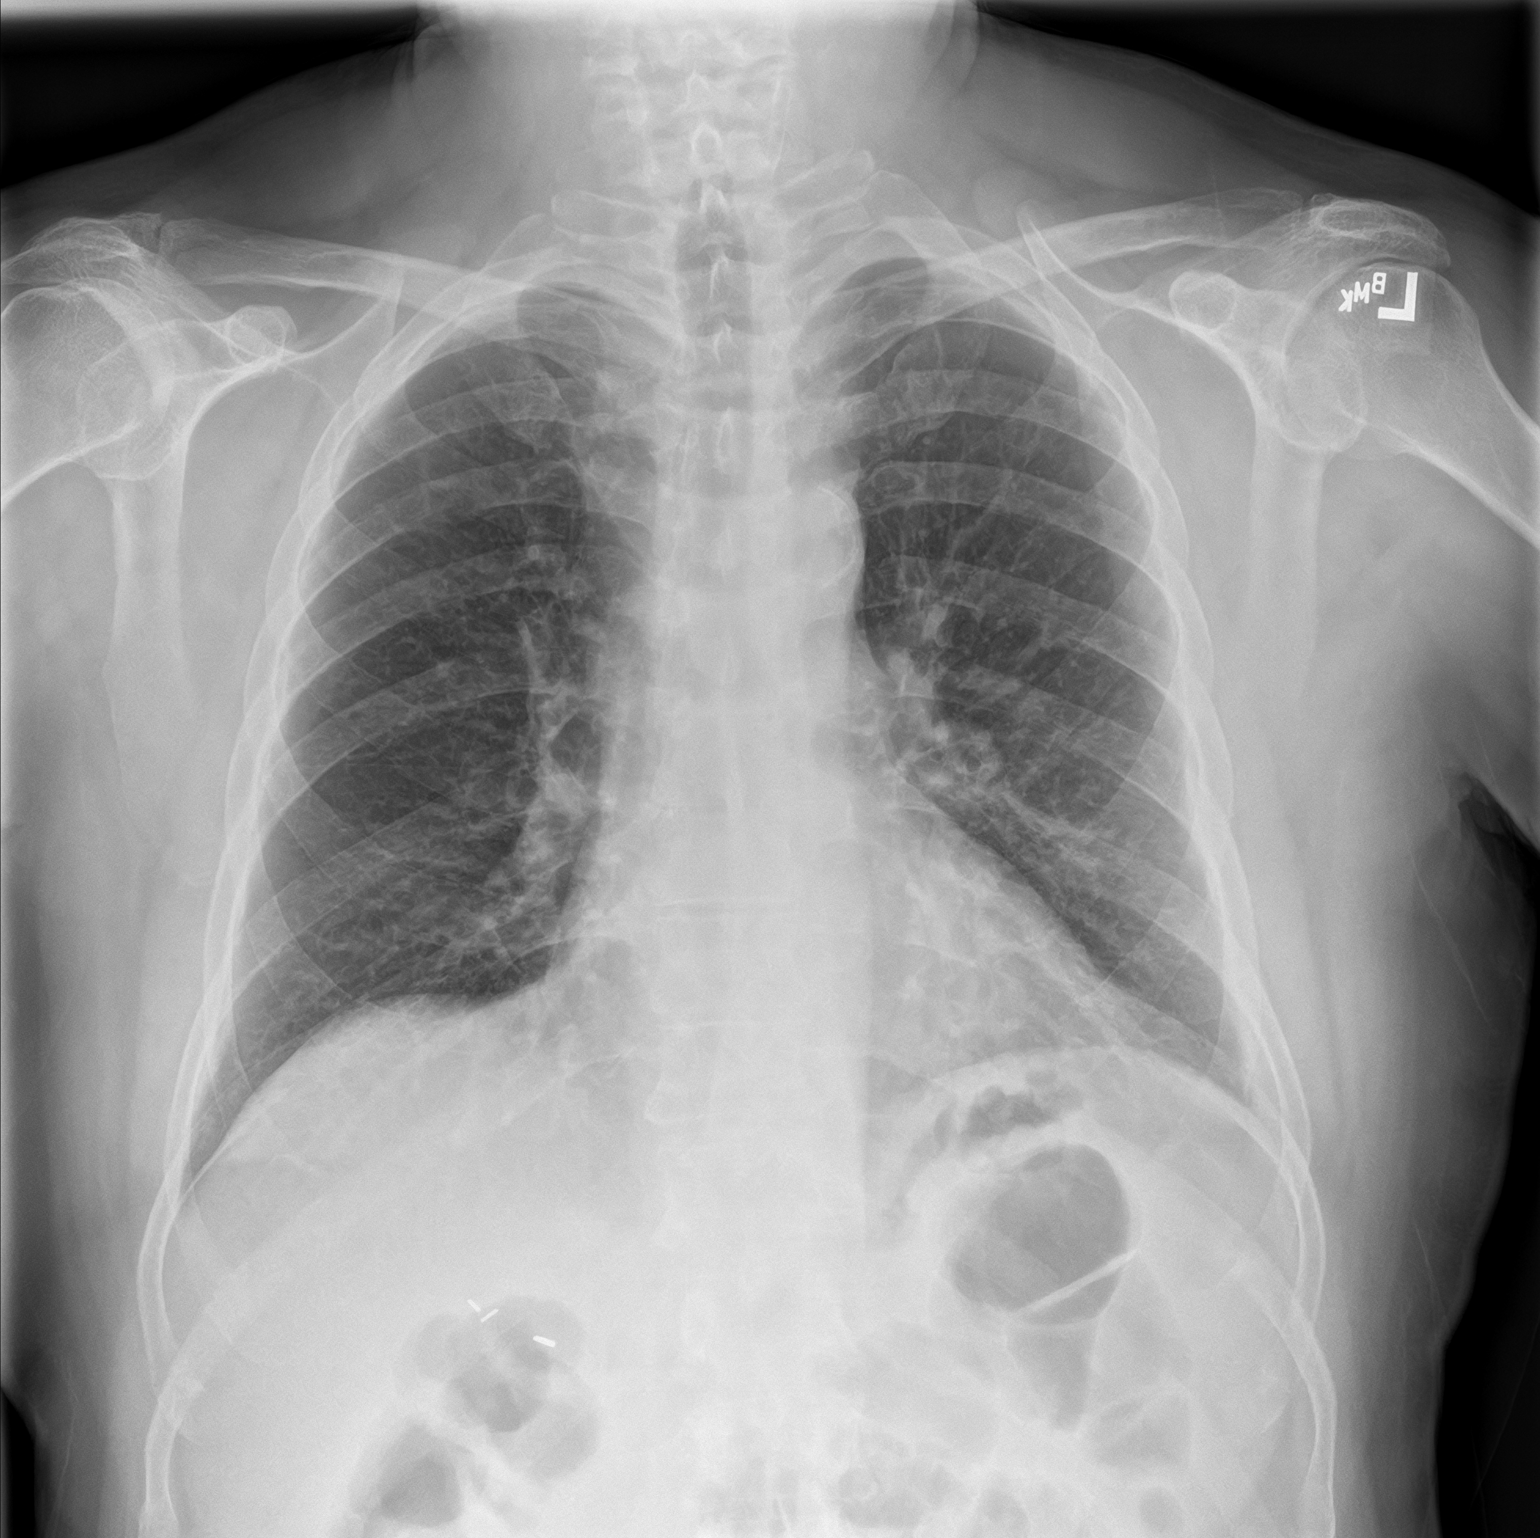
[im 2/2]
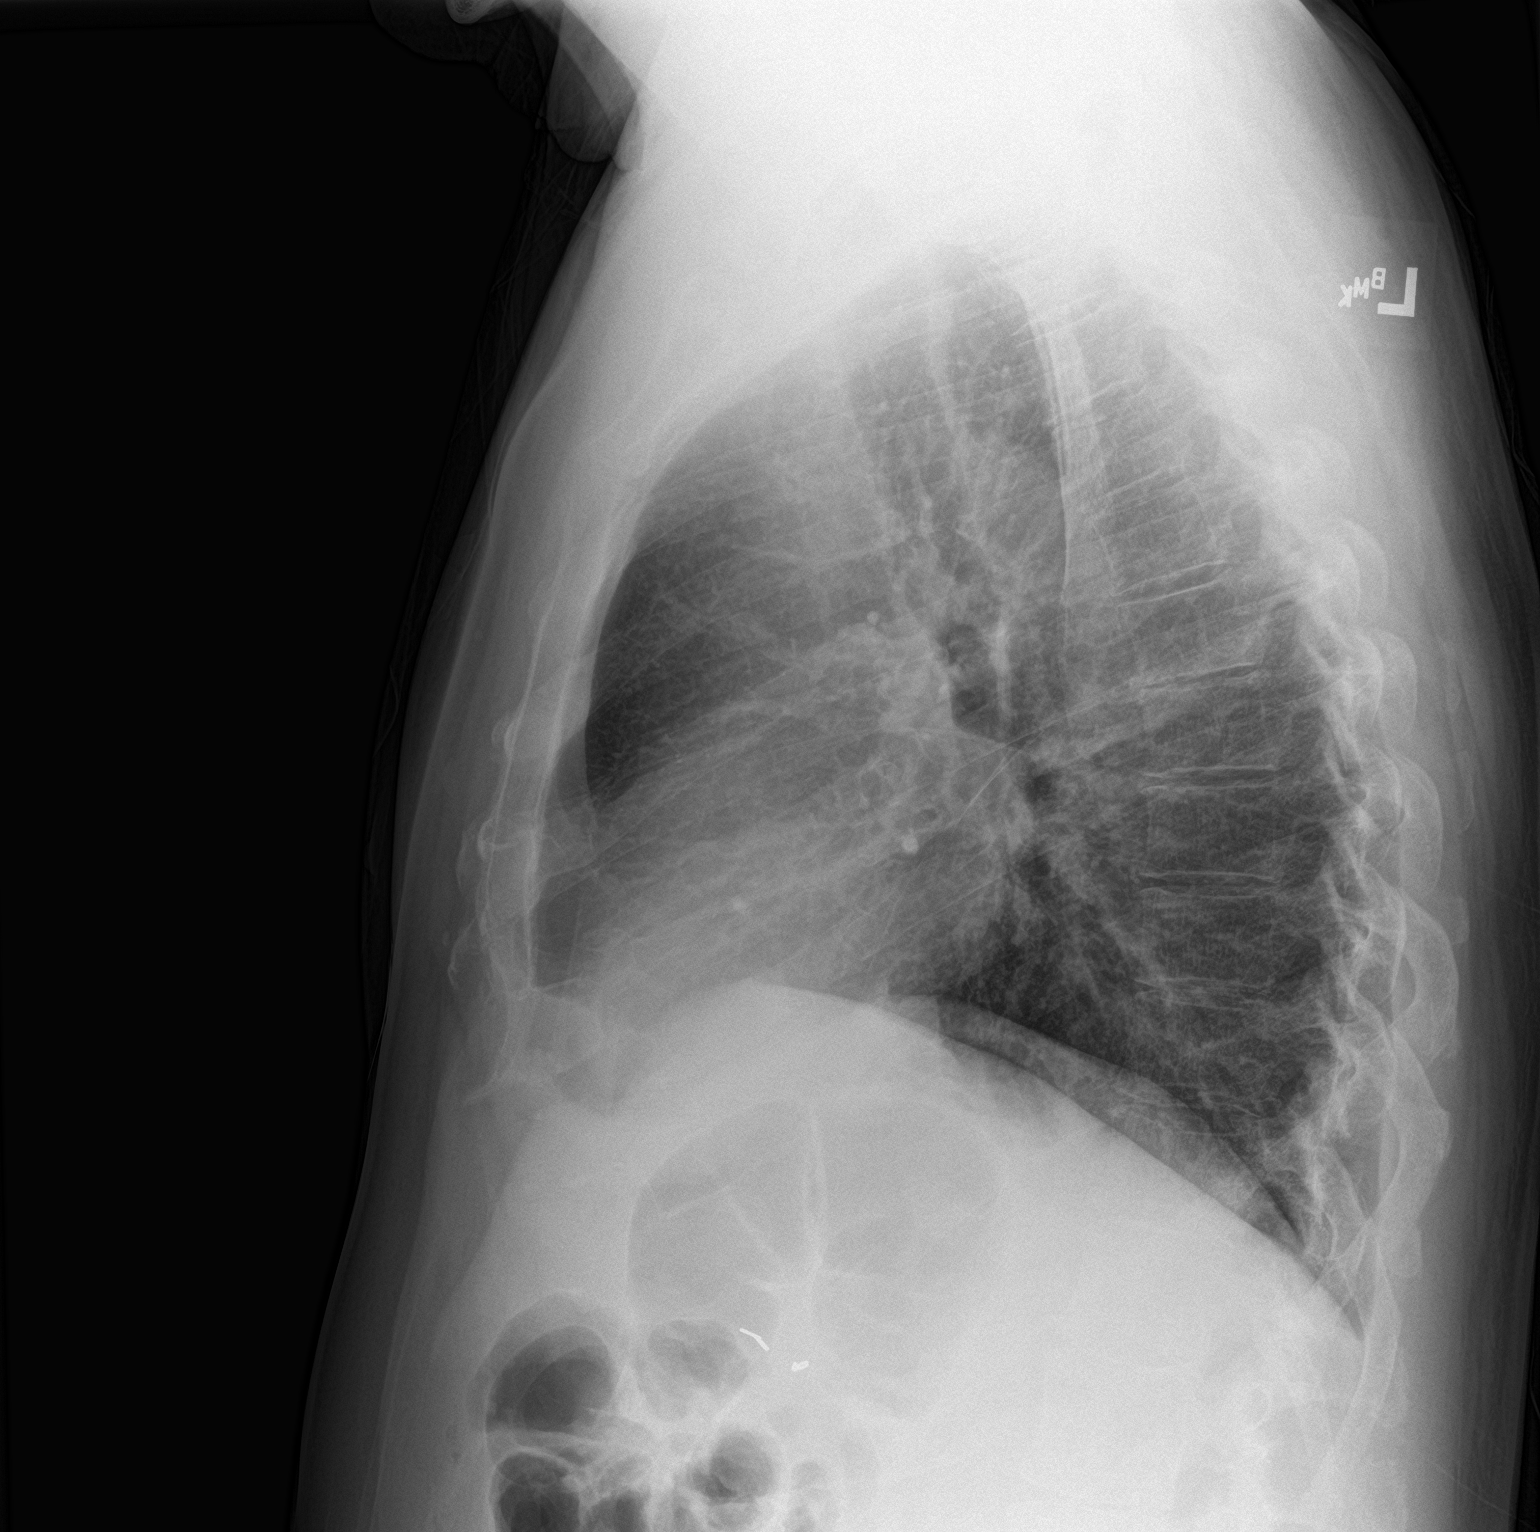

[2 of 2 positions shown; findings below may reference images not displayed]

FINDINGS: Lungs are adequately inflated without focal airspace consolidation
or effusion. Cardiomediastinal silhouette and remainder of the exam
is unchanged.
IMPRESSION: No active cardiopulmonary disease.

## 2019-12-31 ENCOUNTER — Ambulatory Visit: Payer: Medicare PPO | Attending: Internal Medicine

## 2019-12-31 NOTE — Progress Notes (Signed)
   [id number]  Name:  Nick Stults.    MRN: [id number] DOB: 1945-09-16  12/31/2019  Mr. Archuletta was observed post Covid-19 immunization for 15 minutes without incident. He was provided with Vaccine Information Sheet and instruction to access the V-Safe system.   Mr. Granja was instructed to call 911 with any severe reactions post vaccine: Marland Kitchen Difficulty breathing  . Swelling of face and throat  . A fast heartbeat  . A bad rash all over body  . Dizziness and weakness

## 2020-01-01 ENCOUNTER — Other Ambulatory Visit: Payer: Self-pay | Admitting: Dermatology

## 2020-01-01 NOTE — Telephone Encounter (Signed)
Unable to leave message. Does patient need Finasteride refills? No linked diagnosis in his chart with medication.

## 2020-01-28 ENCOUNTER — Ambulatory Visit: Payer: Medicare PPO | Admitting: Podiatry

## 2020-01-28 ENCOUNTER — Encounter: Payer: Self-pay | Admitting: Podiatry

## 2020-01-28 ENCOUNTER — Other Ambulatory Visit: Payer: Self-pay

## 2020-01-28 ENCOUNTER — Ambulatory Visit (INDEPENDENT_AMBULATORY_CARE_PROVIDER_SITE_OTHER): Payer: Medicare PPO

## 2020-01-28 DIAGNOSIS — M722 Plantar fascial fibromatosis: Secondary | ICD-10-CM

## 2020-01-28 MED ORDER — METHYLPREDNISOLONE 4 MG PO TBPK: ORAL_TABLET | ORAL | 0 refills | Status: DC

## 2020-01-28 MED ORDER — TRIAMCINOLONE ACETONIDE 40 MG/ML IJ SUSP
20.0000 mg | Freq: Once | INTRAMUSCULAR | Status: AC
  Administered 2020-01-28: 20 mg

## 2020-01-28 MED ORDER — MELOXICAM 15 MG PO TABS: 15.0000 mg | ORAL_TABLET | Freq: Every day | ORAL | 3 refills | Status: DC

## 2020-01-28 NOTE — Progress Notes (Signed)
Subjective:  Patient ID: Raymond Hock., male    DOB: Oct 30, 1945,  MRN: [id number] HPI Chief Complaint  Patient presents with  . Foot Pain    patient presents today for left heel pain x 6 months   He states "its really painful to drive and it feels like a bruise.  Its really bad in the mornings when I get up"  He has been taking Advil, Tylenol, using ice and rest    74 y.o. male presents with the above complaint.   ROS: He denies fever chills nausea vomiting muscle aches pains calf pain back pain chest pain shortness of breath.  Past Medical History:  Diagnosis Date  . Basal cell carcinoma 01/17/2014   L lateral top of shoulder ant, L ant top of shoulder post  . Basal cell carcinoma 01/01/2016   L mid back  . Basal cell carcinoma 11/17/2017   R mid ear helix  . Basal cell carcinoma 02/06/2019   L scalp/temple area within the hair above the sideburn/excision  . Cancer (Normal)   . Chronic kidney disease    renal cell carcinoma  . GERD (gastroesophageal reflux disease)   . History of colon polyps   . History of gastric polyp   . History of hiatal hernia   . Neuropathy   . Non Hodgkin's lymphoma ([place])   . PONV (postoperative nausea and vomiting)    "difficulty waking up after surgery"  . Reflux   . Renal mass    Past Surgical History:  Procedure Laterality Date  . APPENDECTOMY    . BONE MARROW TRANSPLANT    . CHOLECYSTECTOMY    . COLONOSCOPY WITH PROPOFOL N/A 02/11/2017   Procedure: COLONOSCOPY WITH PROPOFOL;  Surgeon: Manya Silvas, MD;  Location: [facility] ENDOSCOPY;  Service: Endoscopy;  Laterality: N/A;  . ESOPHAGOGASTRODUODENOSCOPY (EGD) WITH PROPOFOL N/A 02/11/2017   Procedure: ESOPHAGOGASTRODUODENOSCOPY (EGD) WITH PROPOFOL;  Surgeon: Manya Silvas, MD;  Location: [facility] ENDOSCOPY;  Service: Endoscopy;  Laterality: N/A;  . FRACTURE SURGERY    . GALLBLADDER SURGERY    . HERNIA REPAIR Left    Inguinal Hernia Repair  . ROBOTIC ASSITED PARTIAL NEPHRECTOMY Left 11/12/2015     Procedure: ROBOTIC ASSITED PARTIAL NEPHRECTOMY;  Surgeon: [name], MD;  Location: [facility] ORS;  Service: Urology;  Laterality: Left;  . TONSILLECTOMY      Current Outpatient Medications:  .  carvedilol (COREG) 6.25 MG tablet, Take 1 tablet by mouth 2 (two) times daily with a meal., Disp: , Rfl:  .  cholecalciferol (VITAMIN D) 1000 units tablet, Take 1,000 Units by mouth daily., Disp: , Rfl:  .  ferrous sulfate 325 (65 FE) MG tablet, Take by mouth., Disp: , Rfl:  .  ibuprofen (ADVIL,MOTRIN) 600 MG tablet, Take 1 tablet (600 mg total) by mouth every 8 (eight) hours as needed., Disp: 30 tablet, Rfl: 0 .  magnesium oxide (MAG-OX) 400 MG tablet, Take 400 mg by mouth daily., Disp: , Rfl:  .  meloxicam (MOBIC) 15 MG tablet, Take 1 tablet (15 mg total) by mouth daily., Disp: 30 tablet, Rfl: 3 .  methylPREDNISolone (MEDROL DOSEPAK) 4 MG TBPK tablet, 6 day dose pack - take as directed, Disp: 21 tablet, Rfl: 0 .  omeprazole (PRILOSEC) 40 MG capsule, Take 40 mg by mouth daily. Reported on 08/06/2015, Disp: , Rfl:  .  Probiotic Product (PROBIOTIC + OMEGA-3) CAPS, Take 1 capsule by mouth daily. , Disp: , Rfl:  .  vitamin B-12 (CYANOCOBALAMIN) 1000 MCG  tablet, Take 1,000 mcg by mouth daily., Disp: , Rfl:   Allergies  Allergen Reactions  . No Known Allergies    Review of Systems Objective:  There were no vitals filed for this visit.  General: Well developed, nourished, in no acute distress, alert and oriented x3   Dermatological: Skin is warm, dry and supple bilateral. Nails x 10 are well maintained; remaining integument appears unremarkable at this time. There are no open sores, no preulcerative lesions, no rash or signs of infection present.  Vascular: Dorsalis Pedis artery and Posterior Tibial artery pedal pulses are 2/4 bilateral with immedate capillary fill time. Pedal hair growth present. No varicosities and no lower extremity edema present bilateral.   Neruologic: Grossly intact via  light touch bilateral. Vibratory intact via tuning fork bilateral. Protective threshold with Semmes Wienstein monofilament intact to all pedal sites bilateral. Patellar and Achilles deep tendon reflexes 2+ bilateral. No Babinski or clonus noted bilateral.   Musculoskeletal: No gross boney pedal deformities bilateral. No pain, crepitus, or limitation noted with foot and ankle range of motion bilateral. Muscular strength 5/5 in all groups tested bilateral.  He has pain on palpation medial calcaneal tubercle of his left heel.  No pain on medial and lateral compression of the calcaneus.  Gait: Unassisted, Nonantalgic.    Radiographs:  Radiographs taken today demonstrate soft tissue increase in density plantar fashion calcaneal insertion site of the left heel.  Small retrocalcaneal heel spurs noted.  Soft tissue increase in density at the plantar fashion calcaneal insertion site consistent with plantar fasciitis.  No fracture of the calcaneus is noted.  Assessment & Plan:   Assessment: Plantar fasciitis left.  Plan: Discussed etiology pathology conservative versus surgical therapies.  At this point I injected the left heel 20 mg Kenalog 5 mg Marcaine to the point of maximal tenderness.  He tolerated procedure well without complications.  Assessment and plan fascial brace to be followed by a night splint.  Start him on a Medrol Dosepak to be followed by meloxicam.  Follow-up with him in 1 month.  We did discuss appropriate shoe gear stretching exercises ice therapy and shoe gear modifications.     Lemont Sitzmann T. [place], [place]

## 2020-01-28 NOTE — Patient Instructions (Signed)

## 2020-02-14 ENCOUNTER — Other Ambulatory Visit: Payer: Self-pay

## 2020-02-14 ENCOUNTER — Ambulatory Visit: Payer: Medicare PPO | Admitting: Dermatology

## 2020-02-14 DIAGNOSIS — L80 Vitiligo: Secondary | ICD-10-CM

## 2020-02-14 DIAGNOSIS — L814 Other melanin hyperpigmentation: Secondary | ICD-10-CM

## 2020-02-14 DIAGNOSIS — L578 Other skin changes due to chronic exposure to nonionizing radiation: Secondary | ICD-10-CM

## 2020-02-14 DIAGNOSIS — Z1283 Encounter for screening for malignant neoplasm of skin: Secondary | ICD-10-CM

## 2020-02-14 DIAGNOSIS — D492 Neoplasm of unspecified behavior of bone, soft tissue, and skin: Secondary | ICD-10-CM

## 2020-02-14 DIAGNOSIS — B078 Other viral warts: Secondary | ICD-10-CM

## 2020-02-14 DIAGNOSIS — Z85828 Personal history of other malignant neoplasm of skin: Secondary | ICD-10-CM

## 2020-02-14 DIAGNOSIS — C44719 Basal cell carcinoma of skin of left lower limb, including hip: Secondary | ICD-10-CM | POA: Diagnosis not present

## 2020-02-14 DIAGNOSIS — D229 Melanocytic nevi, unspecified: Secondary | ICD-10-CM

## 2020-02-14 DIAGNOSIS — D1800 Hemangioma unspecified site: Secondary | ICD-10-CM

## 2020-02-14 DIAGNOSIS — L821 Other seborrheic keratosis: Secondary | ICD-10-CM

## 2020-02-14 NOTE — Progress Notes (Signed)
Follow-Up Visit   Subjective  Raymond Bennett. is a 74 y.o. male who presents for the following: Skin Cancer (6 months f/u hx of BCC ) and Annual Exam (Yearly total body mole check ). The patient presents for Total-Body Skin Exam (TBSE) for skin cancer screening and mole check. Wart In the R ear growing will not go away.  The following portions of the chart were reviewed this encounter and updated as appropriate:   Tobacco  Allergies  Meds  Problems  Med Hx  Surg Hx  Fam Hx     Review of Systems:  No other skin or systemic complaints except as noted in HPI or Assessment and Plan.  Objective  Well appearing patient in no apparent distress; mood and affect are within normal limits.  A full examination was performed including scalp, head, eyes, ears, nose, lips, neck, chest, axillae, abdomen, back, buttocks, bilateral upper extremities, bilateral lower extremities, hands, feet, fingers, toes, fingernails, and toenails. All findings within normal limits unless otherwise noted below.  Objective  multiple see history: Well healed scar with no evidence of recurrence.   Objective  Right Ear anterior helix: Verrucous papules -- Discussed viral etiology and contagion.   Objective  Mid Back: Hypopigmented macules   Objective  Left thigh above knee: 0.8 cm Pink patch    Assessment & Plan  History of basal cell carcinoma (BCC) multiple see history Clear. Observe for recurrence. Call clinic for new or changing lesions.  Recommend regular skin exams, daily broad-spectrum spf 30+ sunscreen use, and photoprotection.     Other viral warts Right Ear anterior helix Destruction of lesion - Right Ear anterior helix Complexity: simple   Destruction method: cryotherapy   Informed consent: discussed and consent obtained   Timeout:  patient name, date of birth, surgical site, and procedure verified Lesion destroyed using liquid nitrogen: Yes   Region frozen until ice ball extended  beyond lesion: Yes   Outcome: patient tolerated procedure well with no complications   Post-procedure details: wound care instructions given    Vitiligo Trunk and extremities Benign condition, no perfect treatment to make go away  Pt declines treatment Reviewed chronic nature, no cure and can be difficult to treat.  Vitiligo is an autoimmune condition which causes loss of skin pigment and is commonly seen on the face and may also involve areas of trauma like hands, elbows, knees, and ankles.  Treatments include topical steroids and other topical anti-inflammatory ointments/creams.  Sometimes narrow band UV light therapy or Xtrac laser is helpful, both of which require twice weekly treatments for at least 3-6 months.  Antioxidant vitamins, such as Vitamins C&E, and alpha lipoic acid may be added to enhance treatment.  Neoplasm of skin Left thigh above knee  Epidermal / dermal shaving  Lesion diameter (cm):  0.8 Informed consent: discussed and consent obtained   Timeout: patient name, date of birth, surgical site, and procedure verified   Procedure prep:  Patient was prepped and draped in usual sterile fashion Prep type:  Isopropyl alcohol Anesthesia: the lesion was anesthetized in a standard fashion   Anesthetic:  1% lidocaine w/ epinephrine 1-100,000 buffered w/ 8.4% NaHCO3 Hemostasis achieved with: pressure, aluminum chloride and electrodesiccation   Outcome: patient tolerated procedure well   Post-procedure details: sterile dressing applied and wound care instructions given   Dressing type: bandage and petrolatum    Destruction of lesion Complexity: extensive   Destruction method: electrodesiccation and curettage   Informed consent: discussed and  consent obtained   Timeout:  patient name, date of birth, surgical site, and procedure verified Procedure prep:  Patient was prepped and draped in usual sterile fashion Prep type:  Isopropyl alcohol Anesthesia: the lesion was anesthetized  in a standard fashion   Anesthetic:  1% lidocaine w/ epinephrine 1-100,000 buffered w/ 8.4% NaHCO3 Curettage performed in three different directions: Yes   Electrodesiccation performed over the curetted area: Yes   Final wound size (cm):  1.5 Hemostasis achieved with:  pressure, aluminum chloride and electrodesiccation Outcome: patient tolerated procedure well with no complications   Post-procedure details: sterile dressing applied and wound care instructions given   Dressing type: bandage and petrolatum    Specimen 1 - Surgical pathology Differential Diagnosis: R/O CBB  Check Margins: No 0.8 cm Pink patch   Lentigines - Scattered tan macules - Discussed due to sun exposure - Benign, observe - Call for any changes  Seborrheic Keratoses - Stuck-on, waxy, tan-brown papules and plaques  - Discussed benign etiology and prognosis. - Observe - Call for any changes  Melanocytic Nevi - Tan-brown and/or pink-flesh-colored symmetric macules and papules - Benign appearing on exam today - Observation - Call clinic for new or changing moles - Recommend daily use of broad spectrum spf 30+ sunscreen to sun-exposed areas.   Hemangiomas - Red papules - Discussed benign nature - Observe - Call for any changes  Actinic Damage - Chronic, secondary to cumulative UV/sun exposure - diffuse scaly erythematous macules with underlying dyspigmentation - Recommend daily broad spectrum sunscreen SPF 30+ to sun-exposed areas, reapply every 2 hours as needed.  - Call for new or changing lesions.  Skin cancer screening performed today.  Return in about 6 months (around 08/14/2020).  IMarye Round, CMA, am acting as scribe for Sarina Ser, MD .  Documentation: I have reviewed the above documentation for accuracy and completeness, and I agree with the above.  Sarina Ser, MD

## 2020-02-14 NOTE — Patient Instructions (Signed)

## 2020-02-19 ENCOUNTER — Encounter: Payer: Self-pay | Admitting: Dermatology

## 2020-02-26 ENCOUNTER — Telehealth: Payer: Self-pay

## 2020-02-26 NOTE — Telephone Encounter (Signed)
No answer and no voicemail set up  

## 2020-02-26 NOTE — Telephone Encounter (Signed)
-----   Message from Ralene Bathe, MD sent at 02/18/2020  9:30 AM EST ----- Diagnosis Skin , left thigh above knee SUPERFICIAL BASAL CELL CARCINOMA  Cancer - BCC Already treated Recheck next visit

## 2020-02-27 ENCOUNTER — Other Ambulatory Visit: Payer: Self-pay | Admitting: Podiatry

## 2020-03-10 ENCOUNTER — Encounter: Payer: Medicare PPO | Admitting: Podiatry

## 2020-03-17 ENCOUNTER — Telehealth: Payer: Self-pay

## 2020-03-17 NOTE — Telephone Encounter (Signed)
Left message on voicemail to return my call.  

## 2020-03-17 NOTE — Telephone Encounter (Signed)
-----   Message from David C Kowalski, MD sent at 02/18/2020  9:30 AM EST ----- Diagnosis Skin , left thigh above knee SUPERFICIAL BASAL CELL CARCINOMA  Cancer - BCC Already treated Recheck next visit 

## 2020-03-18 ENCOUNTER — Telehealth: Payer: Self-pay

## 2020-03-18 NOTE — Telephone Encounter (Signed)
Advised patient of results/hd  

## 2020-03-18 NOTE — Telephone Encounter (Signed)
-----   Message from David C Kowalski, MD sent at 02/18/2020  9:30 AM EST ----- Diagnosis Skin , left thigh above knee SUPERFICIAL BASAL CELL CARCINOMA  Cancer - BCC Already treated Recheck next visit 

## 2020-04-01 ENCOUNTER — Other Ambulatory Visit: Payer: Self-pay | Admitting: Dermatology

## 2020-05-30 ENCOUNTER — Other Ambulatory Visit: Payer: Self-pay | Admitting: Internal Medicine

## 2020-06-02 ENCOUNTER — Other Ambulatory Visit (HOSPITAL_COMMUNITY): Payer: Self-pay | Admitting: Internal Medicine

## 2020-06-02 DIAGNOSIS — I429 Cardiomyopathy, unspecified: Secondary | ICD-10-CM

## 2020-06-02 DIAGNOSIS — I472 Ventricular tachycardia, unspecified: Secondary | ICD-10-CM

## 2020-06-02 DIAGNOSIS — R5383 Other fatigue: Secondary | ICD-10-CM

## 2020-06-02 DIAGNOSIS — I428 Other cardiomyopathies: Secondary | ICD-10-CM

## 2020-08-13 ENCOUNTER — Ambulatory Visit: Payer: Medicare PPO | Admitting: Dermatology

## 2020-08-13 ENCOUNTER — Other Ambulatory Visit: Payer: Self-pay

## 2020-08-13 DIAGNOSIS — Z85828 Personal history of other malignant neoplasm of skin: Secondary | ICD-10-CM | POA: Diagnosis not present

## 2020-08-13 DIAGNOSIS — L578 Other skin changes due to chronic exposure to nonionizing radiation: Secondary | ICD-10-CM

## 2020-08-13 DIAGNOSIS — L820 Inflamed seborrheic keratosis: Secondary | ICD-10-CM | POA: Diagnosis not present

## 2020-08-13 NOTE — Progress Notes (Signed)
   Follow-Up Visit   Subjective  Raymond Bennett. is a 75 y.o. male who presents for the following: recheck BCC site (L ant thigh above the knee - healed well per patient ) and wart (R ear helix - S/P LN2, recheck today ).  The following portions of the chart were reviewed this encounter and updated as appropriate:   Tobacco  Allergies  Meds  Problems  Med Hx  Surg Hx  Fam Hx      Review of Systems:  No other skin or systemic complaints except as noted in HPI or Assessment and Plan.  Objective  Well appearing patient in no apparent distress; mood and affect are within normal limits.  A focused examination was performed including the face, ears, arms, and legs. Relevant physical exam findings are noted in the Assessment and Plan.  L ant thigh above the knee Well healed scar.  Right Ear Erythematous keratotic or waxy stuck-on papule or plaque.   Assessment & Plan  History of basal cell carcinoma (BCC) L ant thigh above the knee  Clear. Observe for recurrence. Call clinic for new or changing lesions.  Recommend regular skin exams, daily broad-spectrum spf 30+ sunscreen use, and photoprotection.     Inflamed seborrheic keratosis Right Ear  Destruction of lesion - Right Ear Complexity: simple   Destruction method: cryotherapy   Informed consent: discussed and consent obtained   Timeout:  patient name, date of birth, surgical site, and procedure verified Lesion destroyed using liquid nitrogen: Yes   Region frozen until ice ball extended beyond lesion: Yes   Outcome: patient tolerated procedure well with no complications   Post-procedure details: wound care instructions given    Actinic Damage - chronic, secondary to cumulative UV radiation exposure/sun exposure over time - diffuse scaly erythematous macules with underlying dyspigmentation - Recommend daily broad spectrum sunscreen SPF 30+ to sun-exposed areas, reapply every 2 hours as needed.  - Recommend staying in  the shade or wearing long sleeves, sun glasses (UVA+UVB protection) and wide brim hats (4-inch brim around the entire circumference of the hat). - Call for new or changing lesions.  Return in about 6 months (around 02/12/2021) for TBSE.  Luther Redo, CMA, am acting as scribe for Sarina Ser, MD .  Documentation: I have reviewed the above documentation for accuracy and completeness, and I agree with the above.  Sarina Ser, MD

## 2020-08-13 NOTE — Patient Instructions (Signed)

## 2020-08-15 ENCOUNTER — Encounter: Payer: Self-pay | Admitting: Dermatology

## 2020-10-13 ENCOUNTER — Other Ambulatory Visit: Payer: Self-pay | Admitting: Dermatology

## 2021-02-18 ENCOUNTER — Other Ambulatory Visit: Payer: Self-pay

## 2021-02-18 ENCOUNTER — Ambulatory Visit: Payer: Medicare PPO | Admitting: Dermatology

## 2021-02-18 DIAGNOSIS — D229 Melanocytic nevi, unspecified: Secondary | ICD-10-CM

## 2021-02-18 DIAGNOSIS — L578 Other skin changes due to chronic exposure to nonionizing radiation: Secondary | ICD-10-CM | POA: Diagnosis not present

## 2021-02-18 DIAGNOSIS — D1801 Hemangioma of skin and subcutaneous tissue: Secondary | ICD-10-CM

## 2021-02-18 DIAGNOSIS — L80 Vitiligo: Secondary | ICD-10-CM | POA: Diagnosis not present

## 2021-02-18 DIAGNOSIS — Z85828 Personal history of other malignant neoplasm of skin: Secondary | ICD-10-CM

## 2021-02-18 DIAGNOSIS — L814 Other melanin hyperpigmentation: Secondary | ICD-10-CM

## 2021-02-18 DIAGNOSIS — L570 Actinic keratosis: Secondary | ICD-10-CM | POA: Diagnosis not present

## 2021-02-18 DIAGNOSIS — Z1283 Encounter for screening for malignant neoplasm of skin: Secondary | ICD-10-CM

## 2021-02-18 DIAGNOSIS — L821 Other seborrheic keratosis: Secondary | ICD-10-CM

## 2021-02-18 DIAGNOSIS — L820 Inflamed seborrheic keratosis: Secondary | ICD-10-CM | POA: Diagnosis not present

## 2021-02-18 NOTE — Patient Instructions (Addendum)
If You Need Anything After Your Visit ° °If you have any questions or concerns for your doctor, please call our main line at [phone] and press option 4 to reach your doctor's medical assistant. If no one answers, please leave a voicemail as directed and we will return your call as soon as possible. Messages left after 4 pm will be answered the following business day.  ° °You may also send us a message via MyChart. We typically respond to MyChart messages within 1-2 business days. ° °For prescription refills, please ask your pharmacy to contact our office. Our fax number is [phone]. ° °If you have an urgent issue when the clinic is closed that cannot wait until the next business day, you can page your doctor at the number below.   ° °Please note that while we do our best to be available for urgent issues outside of office hours, we are not available 24/7.  ° °If you have an urgent issue and are unable to reach us, you may choose to seek medical care at your doctor's office, retail clinic, urgent care center, or emergency room. ° °If you have a medical emergency, please immediately call 911 or go to the emergency department. ° °Pager Numbers ° °- Dr. Kowalski: [phone] ° °- Dr. Moye: [phone] ° °- Dr. Stewart: [phone] ° °In the event of inclement weather, please call our main line at [phone] for an update on the status of any delays or closures. ° °Dermatology Medication Tips: °Please keep the boxes that topical medications come in in order to help keep track of the instructions about where and how to use these. Pharmacies typically print the medication instructions only on the boxes and not directly on the medication tubes.  ° °If your medication is too expensive, please contact our office at [phone] option 4 or send us a message through MyChart.  ° °We are unable to tell what your co-pay for medications will be in advance as this is different depending on your insurance coverage.  However, we may be able to find a substitute medication at lower cost or fill out paperwork to get insurance to cover a needed medication.  ° °If a prior authorization is required to get your medication covered by your insurance company, please allow us 1-2 business days to complete this process. ° °Drug prices often vary depending on where the prescription is filled and some pharmacies may offer cheaper prices. ° °The website www.goodrx.com contains coupons for medications through different pharmacies. The prices here do not account for what the cost may be with help from insurance (it may be cheaper with your insurance), but the website can give you the price if you did not use any insurance.  °- You can print the associated coupon and take it with your prescription to the pharmacy.  °- You may also stop by our office during regular business hours and pick up a GoodRx coupon card.  °- If you need your prescription sent electronically to a different pharmacy, notify our office through [facility] MyChart or by phone at [phone] option 4. ° ° ° ° °Si Usted Necesita Algo Después de Su Visita ° °También puede enviarnos un mensaje a través de MyChart. Por lo general respondemos a los mensajes de MyChart en el transcurso de 1 a 2 días hábiles. ° °Para renovar recetas, por favor pida a su farmacia que se ponga en contacto con nuestra oficina. Nuestro número de fax es el [phone]. ° °Si tiene   un asunto urgente cuando la clnica est cerrada y que no puede esperar hasta el siguiente da hbil, puede llamar/localizar a su doctor(a) al nmero que aparece a continuacin.   Por favor, tenga en cuenta que aunque hacemos todo lo posible para estar disponibles para asuntos urgentes fuera del horario de [place], no estamos disponibles las 24 horas del da, los 7 das de la [place].   Si tiene un problema urgente y no puede comunicarse con nosotros, puede optar por buscar atencin mdica  en el consultorio de su  doctor(a), en una clnica privada, en un centro de atencin urgente o en una sala de emergencias.  Si tiene Engineering geologist, por favor llame inmediatamente al 911 o vaya a la sala de emergencias.  Nmeros de bper  - Dr. Nehemiah Massed: [phone]  - Dra. Moye: [phone]  - Dra. Nicole Kindred: [phone]  En caso de inclemencias del [place], por favor llame a Johnsie Kindred principal al [phone] para una actualizacin sobre el [place] de cualquier retraso o cierre.  Consejos para la medicacin en dermatologa: Por favor, guarde las cajas en las que vienen los medicamentos de uso tpico para ayudarle a seguir las instrucciones sobre dnde y cmo usarlos. Las farmacias generalmente imprimen las instrucciones del medicamento slo en las cajas y no directamente en los tubos del [place].   Si su medicamento es muy caro, por favor, pngase en contacto con Zigmund Daniel llamando al [phone] y presione la opcin 4 o envenos un mensaje a travs de Pharmacist, community.   No podemos decirle cul ser su copago por los medicamentos por adelantado ya que esto es diferente dependiendo de la cobertura de su seguro. Sin embargo, es posible que podamos encontrar un medicamento sustituto a Electrical engineer un formulario para que el seguro cubra el medicamento que se considera necesario.   Si se requiere una autorizacin previa para que su compaa de seguros [place] su medicamento, por favor permtanos de 1 a 2 das hbiles para completar este proceso.  Los precios de los medicamentos varan con frecuencia dependiendo del Environmental consultant de dnde se surte la receta y alguna farmacias pueden ofrecer precios ms baratos.  El sitio web www.goodrx.com tiene cupones para medicamentos de Airline pilot. Los precios aqu no tienen en cuenta lo que podra costar con la ayuda del seguro (puede ser ms barato con su seguro), pero el sitio web puede darle el precio si no utiliz Research scientist (physical sciences).  - Puede imprimir el cupn  correspondiente y llevarlo con su receta a la farmacia.  - Tambin puede pasar por nuestra oficina durante el horario de atencin regular y Charity fundraiser una tarjeta de cupones de GoodRx.  - Si necesita que su receta se enve electrnicamente a una farmacia diferente, informe a nuestra oficina a travs de MyChart de [place] o por telfono llamando al [phone] y presione la opcin 4.      If You Need Anything After Your Visit  If you have any questions or concerns for your doctor, please call our main line at [phone] and press option 4 to reach your doctor's medical assistant. If no one answers, please leave a voicemail as directed and we will return your call as soon as possible. Messages left after 4 pm will be answered the following business day.   You may also send [place] a message via [place]. We typically respond to MyChart messages within 1-2 business days.  For prescription refills, please ask your pharmacy to contact our office. Our fax number is  [phone].  If you have an urgent issue when the clinic is closed that cannot wait until the next business day, you can page your doctor at the number below.    Please note that while we do our best to be available for urgent issues outside of office hours, we are not available 24/7.   If you have an urgent issue and are unable to reach [place], you may choose to seek medical care at your doctor's office, retail clinic, urgent care center, or emergency room.  If you have a medical emergency, please immediately call 911 or go to the emergency department.  Pager Numbers  - Dr. Nehemiah Massed: [phone]  - Dr. Laurence Ferrari: [phone]  - Dr. Nicole Kindred: [phone]  In the event of inclement weather, please call our main line at [phone] for an update on the status of any delays or closures.  Dermatology Medication Tips: Please keep the boxes that topical medications come in in order to help keep track of the instructions about where and how  to use these. Pharmacies typically print the medication instructions only on the boxes and not directly on the medication tubes.   If your medication is too expensive, please contact our office at [phone] option 4 or send [place] a message through [place].   We are unable to tell what your co-pay for medications will be in advance as this is different depending on your insurance coverage. However, we may be able to find a substitute medication at lower cost or fill out paperwork to get insurance to cover a needed medication.   If a prior authorization is required to get your medication covered by your insurance company, please allow [place] 1-2 business days to complete this process.  Drug prices often vary depending on where the prescription is filled and some pharmacies may offer cheaper prices.  The website www.goodrx.com contains coupons for medications through different pharmacies. The prices here do not account for what the cost may be with help from insurance (it may be cheaper with your insurance), but the website can give you the price if you did not use any insurance.  - You can print the associated coupon and take it with your prescription to the pharmacy.  - You may also stop by our office during regular business hours and pick up a GoodRx coupon card.  - If you need your prescription sent electronically to a different pharmacy, notify our office through [facility] or by phone at [phone] option 4.     Si Usted Necesita Algo Despus de Su Visita  Tambin puede enviarnos un mensaje a travs de Pharmacist, community. Por lo general respondemos a los mensajes de MyChart en el transcurso de 1 a 2 das hbiles.  Para renovar recetas, por favor pida a su farmacia que se ponga en contacto con nuestra oficina. Harland Dingwall de fax es [place] [phone].  Si tiene un asunto urgente cuando la clnica est cerrada y que no puede esperar hasta el siguiente da hbil, puede llamar/localizar a su  doctor(a) al nmero que aparece a continuacin.   Por favor, tenga en cuenta que aunque hacemos todo lo posible para estar disponibles para asuntos urgentes fuera del horario de [place], no estamos disponibles las 24 horas del da, los 7 das de la [place].   Si tiene un problema urgente y no puede comunicarse con nosotros, puede optar por buscar atencin mdica  en el consultorio de su doctor(a), en una clnica privada, en un centro de atencin urgente  o en una sala de emergencias.  Si tiene Engineering geologist, por favor llame inmediatamente al 911 o vaya a la sala de emergencias.  Nmeros de bper  - Dr. Nehemiah Massed: [phone]  - Dra. Moye: [phone]  - Dra. Nicole Kindred: [phone]  En caso de inclemencias del [place], por favor llame a Johnsie Kindred principal al [phone] para una actualizacin sobre el [place] de cualquier retraso o cierre.  Consejos para la medicacin en dermatologa: Por favor, guarde las cajas en las que vienen los medicamentos de uso tpico para ayudarle a seguir las instrucciones sobre dnde y cmo usarlos. Las farmacias generalmente imprimen las instrucciones del medicamento slo en las cajas y no directamente en los tubos del [place].   Si su medicamento es muy caro, por favor, pngase en contacto con Zigmund Daniel llamando al [phone] y presione la opcin 4 o envenos un mensaje a travs de Pharmacist, community.   No podemos decirle cul ser su copago por los medicamentos por adelantado ya que esto es diferente dependiendo de la cobertura de su seguro. Sin embargo, es posible que podamos encontrar un medicamento sustituto a Electrical engineer un formulario para que el seguro cubra el medicamento que se considera necesario.   Si se requiere una autorizacin previa para que su compaa de seguros [place] su medicamento, por favor permtanos de 1 a 2 das hbiles para completar este proceso.  Los precios de los medicamentos varan con frecuencia dependiendo del  Environmental consultant de dnde se surte la receta y alguna farmacias pueden ofrecer precios ms baratos.  El sitio web www.goodrx.com tiene cupones para medicamentos de Airline pilot. Los precios aqu no tienen en cuenta lo que podra costar con la ayuda del seguro (puede ser ms barato con su seguro), pero el sitio web puede darle el precio si no utiliz Research scientist (physical sciences).  - Puede imprimir el cupn correspondiente y llevarlo con su receta a la farmacia.  - Tambin puede pasar por nuestra oficina durante el horario de atencin regular y Charity fundraiser una tarjeta de cupones de GoodRx.  - Si necesita que su receta se enve electrnicamente a una farmacia diferente, informe a nuestra oficina a travs de MyChart de [place] o por telfono llamando al [phone] y presione la opcin 4.

## 2021-02-18 NOTE — Progress Notes (Signed)
Follow-Up Visit   Subjective  Raymond Bennett. is a 75 y.o. male who presents for the following: Annual Exam (History of BCC - TBSE today). The patient presents for Total-Body Skin Exam (TBSE) for skin cancer screening and mole check.  The patient has spots, moles and lesions to be evaluated, some may be new or changing and the patient has concerns that these could be cancer.  The following portions of the chart were reviewed this encounter and updated as appropriate:   Tobacco   Allergies   Meds   Problems   Med Hx   Surg Hx   Fam Hx      Review of Systems:  No other skin or systemic complaints except as noted in HPI or Assessment and Plan.  Objective  Well appearing patient in no apparent distress; mood and affect are within normal limits.  A full examination was performed including scalp, head, eyes, ears, nose, lips, neck, chest, axillae, abdomen, back, buttocks, bilateral upper extremities, bilateral lower extremities, hands, feet, fingers, toes, fingernails, and toenails. All findings within normal limits unless otherwise noted below.  Nose x 2 , left temple x 2, left ear x 1 (5) Erythematous thin papules/macules with gritty scale.   Right Ear x 1, right forearm x 1, left forearm x 1 (3) Erythematous keratotic or waxy stuck-on papule or plaque.     Assessment & Plan   History of Basal Cell Carcinoma of the Skin - No evidence of recurrence today - Recommend regular full body skin exams - Recommend daily broad spectrum sunscreen SPF 30+ to sun-exposed areas, reapply every 2 hours as needed.  - Call if any new or changing lesions are noted between office visits  Lentigines - Scattered tan macules - Due to sun exposure - Benign-appearing, observe - Recommend daily broad spectrum sunscreen SPF 30+ to sun-exposed areas, reapply every 2 hours as needed. - Call for any changes  Seborrheic Keratoses - Stuck-on, waxy, tan-brown papules and/or plaques  - Benign-appearing -  Discussed benign etiology and prognosis. - Observe - Call for any changes  Melanocytic Nevi - Tan-brown and/or pink-flesh-colored symmetric macules and papules - Benign appearing on exam today - Observation - Call clinic for new or changing moles - Recommend daily use of broad spectrum spf 30+ sunscreen to sun-exposed areas.   Hemangiomas - Red papules - Discussed benign nature - Observe - Call for any changes  Actinic Damage - Chronic condition, secondary to cumulative UV/sun exposure - diffuse scaly erythematous macules with underlying dyspigmentation - Recommend daily broad spectrum sunscreen SPF 30+ to sun-exposed areas, reapply every 2 hours as needed.  - Staying in the shade or wearing long sleeves, sun glasses (UVA+UVB protection) and wide brim hats (4-inch brim around the entire circumference of the hat) are also recommended for sun protection.  - Call for new or changing lesions.  Skin cancer screening performed today.  AK (actinic keratosis) (5) Nose x 2 , left temple x 2, left ear x 1  Destruction of lesion - Nose x 2 , left temple x 2, left ear x 1 Complexity: simple   Destruction method: cryotherapy   Informed consent: discussed and consent obtained   Timeout:  patient name, date of birth, surgical site, and procedure verified Lesion destroyed using liquid nitrogen: Yes   Region frozen until ice ball extended beyond lesion: Yes   Outcome: patient tolerated procedure well with no complications   Post-procedure details: wound care instructions given  Inflamed seborrheic keratosis Right Ear x 1, right forearm x 1, left forearm x 1  Dermatoscopic exam of right ear most consistent with inflamed SK - recheck on follow up  Destruction of lesion - Right Ear x 1, right forearm x 1, left forearm x 1 Complexity: simple   Destruction method: cryotherapy   Informed consent: discussed and consent obtained   Timeout:  patient name, date of birth, surgical site, and  procedure verified Lesion destroyed using liquid nitrogen: Yes   Region frozen until ice ball extended beyond lesion: Yes   Outcome: patient tolerated procedure well with no complications   Post-procedure details: wound care instructions given    Vitiligo  Reviewed chronic nature, no cure and can be difficult to treat.  Vitiligo is an autoimmune condition which causes loss of skin pigment and is commonly seen on the face and may also involve areas of trauma like hands, elbows, knees, and ankles.  Treatments include topical steroids and other topical anti-inflammatory ointments/creams and topical and oral Jak inhibitors.  Sometimes narrow band UV light therapy or Xtrac laser is helpful, both of which require twice weekly treatments for at least 3-6 months.  Antioxidant vitamins, such as Vitamins A,C,E,D, Folic Acid and B12 may be added to enhance treatment.  Discussed Opzelura and new oral treatments - not recommended at this time.  Skin cancer screening   Return in about 6 months (around 08/19/2021) for Follow up.  I, Ashok Cordia, CMA, am acting as scribe for Sarina Ser, MD . Documentation: I have reviewed the above documentation for accuracy and completeness, and I agree with the above.  Sarina Ser, MD

## 2021-02-22 ENCOUNTER — Encounter: Payer: Self-pay | Admitting: Dermatology

## 2021-03-25 ENCOUNTER — Other Ambulatory Visit: Payer: Self-pay | Admitting: Surgery

## 2021-03-25 ENCOUNTER — Other Ambulatory Visit: Payer: Self-pay

## 2021-03-25 ENCOUNTER — Ambulatory Visit
Admission: RE | Admit: 2021-03-25 | Discharge: 2021-03-25 | Disposition: A | Discharge status: Home / Self Care | Payer: Medicare PPO | Source: Ambulatory Visit | Attending: Surgery | Admitting: Surgery

## 2021-03-25 ENCOUNTER — Other Ambulatory Visit (HOSPITAL_COMMUNITY): Payer: Self-pay | Admitting: Surgery

## 2021-03-25 DIAGNOSIS — R1031 Right lower quadrant pain: Secondary | ICD-10-CM | POA: Insufficient documentation

## 2021-03-25 LAB — POCT I-STAT CREATININE: Creatinine, Ser: 1.6 mg/dL — ABNORMAL HIGH (ref 0.61–1.24)

## 2021-03-25 MED ORDER — IOHEXOL 300 MG/ML  SOLN
100.0000 mL | Freq: Once | INTRAMUSCULAR | Status: AC | PRN
  Administered 2021-03-25: 100 mL via INTRAVENOUS

## 2021-08-20 ENCOUNTER — Ambulatory Visit: Payer: Medicare PPO | Admitting: Dermatology

## 2021-08-20 DIAGNOSIS — L80 Vitiligo: Secondary | ICD-10-CM | POA: Diagnosis not present

## 2021-08-20 DIAGNOSIS — L570 Actinic keratosis: Secondary | ICD-10-CM | POA: Diagnosis not present

## 2021-08-20 DIAGNOSIS — L820 Inflamed seborrheic keratosis: Secondary | ICD-10-CM

## 2021-08-20 DIAGNOSIS — R519 Headache, unspecified: Secondary | ICD-10-CM

## 2021-08-20 NOTE — Patient Instructions (Addendum)
Actinic keratoses are precancerous spots that appear secondary to cumulative UV radiation exposure/sun exposure over time. They are chronic with expected duration over 1 year. A portion of actinic keratoses will progress to squamous cell carcinoma of the skin. It is not possible to reliably predict which spots will progress to skin cancer and so treatment is recommended to prevent development of skin cancer.  Recommend daily broad spectrum sunscreen SPF 30+ to sun-exposed areas, reapply every 2 hours as needed.  Recommend staying in the shade or wearing long sleeves, sun glasses (UVA+UVB protection) and wide brim hats (4-inch brim around the entire circumference of the hat). Call for new or changing lesions.   Cryotherapy Aftercare  Wash gently with soap and water everyday.   Apply Vaseline and Band-Aid daily until healed.   Seborrheic Keratosis  What causes seborrheic keratoses? Seborrheic keratoses are harmless, common skin growths that first appear during adult life.  As time goes by, more growths appear.  Some people may develop a large number of them.  Seborrheic keratoses appear on both covered and uncovered body parts.  They are not caused by sunlight.  The tendency to develop seborrheic keratoses can be inherited.  They vary in color from skin-colored to gray, brown, or even black.  They can be either smooth or have a rough, warty surface.   Seborrheic keratoses are superficial and look as if they were stuck on the skin.  Under the microscope this type of keratosis looks like layers upon layers of skin.  That is why at times the top layer may seem to fall off, but the rest of the growth remains and re-grows.    Treatment Seborrheic keratoses do not need to be treated, but can easily be removed in the office.  Seborrheic keratoses often cause symptoms when they rub on clothing or jewelry.  Lesions can be in the way of shaving.  If they become inflamed, they can cause itching, soreness, or  burning.  Removal of a seborrheic keratosis can be accomplished by freezing, burning, or surgery. If any spot bleeds, scabs, or grows rapidly, please return to have it checked, as these can be an indication of a skin cancer.          Due to recent changes in healthcare laws, you may see results of your pathology and/or laboratory studies on MyChart before the doctors have had a chance to review them. We understand that in some cases there may be results that are confusing or concerning to you. Please understand that not all results are received at the same time and often the doctors may need to interpret multiple results in order to provide you with the best plan of care or course of treatment. Therefore, we ask that you please give us 2 business days to thoroughly review all your results before contacting the office for clarification. Should we see a critical lab result, you will be contacted sooner.   If You Need Anything After Your Visit  If you have any questions or concerns for your doctor, please call our main line at [phone] and press option 4 to reach your doctor's medical assistant. If no one answers, please leave a voicemail as directed and we will return your call as soon as possible. Messages left after 4 pm will be answered the following business day.   You may also send us a message via MyChart. We typically respond to MyChart messages within 1-2 business days.  For prescription refills, please ask your pharmacy   to contact our office. Our fax number is [phone].  If you have an urgent issue when the clinic is closed that cannot wait until the next business day, you can page your doctor at the number below.    Please note that while we do our best to be available for urgent issues outside of office hours, we are not available 24/7.   If you have an urgent issue and are unable to reach us, you may choose to seek medical care at your doctor's office, retail clinic,  urgent care center, or emergency room.  If you have a medical emergency, please immediately call 911 or go to the emergency department.  Pager Numbers  - Dr. Kowalski: [phone]  - Dr. Moye: [phone]  - Dr. Stewart: [phone]  In the event of inclement weather, please call our main line at [phone] for an update on the status of any delays or closures.  Dermatology Medication Tips: Please keep the boxes that topical medications come in in order to help keep track of the instructions about where and how to use these. Pharmacies typically print the medication instructions only on the boxes and not directly on the medication tubes.   If your medication is too expensive, please contact our office at [phone] option 4 or send us a message through MyChart.   We are unable to tell what your co-pay for medications will be in advance as this is different depending on your insurance coverage. However, we may be able to find a substitute medication at lower cost or fill out paperwork to get insurance to cover a needed medication.   If a prior authorization is required to get your medication covered by your insurance company, please allow us 1-2 business days to complete this process.  Drug prices often vary depending on where the prescription is filled and some pharmacies may offer cheaper prices.  The website www.goodrx.com contains coupons for medications through different pharmacies. The prices here do not account for what the cost may be with help from insurance (it may be cheaper with your insurance), but the website can give you the price if you did not use any insurance.  - You can print the associated coupon and take it with your prescription to the pharmacy.  - You may also stop by our office during regular business hours and pick up a GoodRx coupon card.  - If you need your prescription sent electronically to a different pharmacy, notify our office through [facility]  MyChart or by phone at [phone] option 4.     Si Usted Necesita Algo Despus de Su Visita  Tambin puede enviarnos un mensaje a travs de MyChart. Por lo general respondemos a los mensajes de MyChart en el transcurso de 1 a 2 das hbiles.  Para renovar recetas, por favor pida a su farmacia que se ponga en contacto con nuestra oficina. Nuestro nmero de fax es el [phone].  Si tiene un asunto urgente cuando la clnica est cerrada y que no puede esperar hasta el siguiente da hbil, puede llamar/localizar a su doctor(a) al nmero que aparece a continuacin.   Por favor, tenga en cuenta que aunque hacemos todo lo posible para estar disponibles para asuntos urgentes fuera del horario de oficina, no estamos disponibles las 24 horas del da, los 7 das de la semana.   Si tiene un problema urgente y no puede comunicarse con nosotros, puede optar por buscar atencin mdica  en el consultorio de su doctor(a), en una   clnica privada, en un centro de atencin urgente o en una sala de emergencias.  Si tiene una emergencia mdica, por favor llame inmediatamente al 911 o vaya a la sala de emergencias.  Nmeros de bper  - Dr. Kowalski: [phone]  - Dra. Moye: [phone]  - Dra. Stewart: [phone]  En caso de inclemencias del tiempo, por favor llame a nuestra lnea principal al [phone] para una actualizacin sobre el estado de cualquier retraso o cierre.  Consejos para la medicacin en dermatologa: Por favor, guarde las cajas en las que vienen los medicamentos de uso tpico para ayudarle a seguir las instrucciones sobre dnde y cmo usarlos. Las farmacias generalmente imprimen las instrucciones del medicamento slo en las cajas y no directamente en los tubos del medicamento.   Si su medicamento es muy caro, por favor, pngase en contacto con nuestra oficina llamando al [phone] y presione la opcin 4 o envenos un mensaje a travs de MyChart.   No podemos decirle cul  ser su copago por los medicamentos por adelantado ya que esto es diferente dependiendo de la cobertura de su seguro. Sin embargo, es posible que podamos encontrar un medicamento sustituto a menor costo o llenar un formulario para que el seguro cubra el medicamento que se considera necesario.   Si se requiere una autorizacin previa para que su compaa de seguros cubra su medicamento, por favor permtanos de 1 a 2 das hbiles para completar este proceso.  Los precios de los medicamentos varan con frecuencia dependiendo del lugar de dnde se surte la receta y alguna farmacias pueden ofrecer precios ms baratos.  El sitio web www.goodrx.com tiene cupones para medicamentos de diferentes farmacias. Los precios aqu no tienen en cuenta lo que podra costar con la ayuda del seguro (puede ser ms barato con su seguro), pero el sitio web puede darle el precio si no utiliz ningn seguro.  - Puede imprimir el cupn correspondiente y llevarlo con su receta a la farmacia.  - Tambin puede pasar por nuestra oficina durante el horario de atencin regular y recoger una tarjeta de cupones de GoodRx.  - Si necesita que su receta se enve electrnicamente a una farmacia diferente, informe a nuestra oficina a travs de MyChart de [facility] o por telfono llamando al [phone] y presione la opcin 4.  

## 2021-08-20 NOTE — Progress Notes (Unsigned)
Follow-Up Visit   Subjective  English [name] is a 76 y.o. male who presents for the following: Actinic Keratosis (Ak follow up hx of aks ). The patient has spots, moles and lesions to be evaluated, some may be new or changing and the patient has concerns that these could be cancer. The following portions of the chart were reviewed this encounter and updated as appropriate:  Tobacco  Allergies  Meds  Problems  Med Hx  Surg Hx  Fam Hx     Review of Systems: No other skin or systemic complaints except as noted in HPI or Assessment and Plan.  Objective  Well appearing patient in no apparent distress; mood and affect are within normal limits.  All sun exposed areas plus back examined.  right forehead x 1 Erythematous thin papules/macules with gritty scale.   Right Temple x 1 Erythematous stuck-on, waxy papule or plaque   Assessment & Plan  Actinic keratosis right forehead x 1 Actinic keratoses are precancerous spots that appear secondary to cumulative UV radiation exposure/sun exposure over time. They are chronic with expected duration over 1 year. A portion of actinic keratoses will progress to squamous cell carcinoma of the skin. It is not possible to reliably predict which spots will progress to skin cancer and so treatment is recommended to prevent development of skin cancer.  Recommend daily broad spectrum sunscreen SPF 30+ to sun-exposed areas, reapply every 2 hours as needed.  Recommend staying in the shade or wearing long sleeves, sun glasses (UVA+UVB protection) and wide brim hats (4-inch brim around the entire circumference of the hat). Call for new or changing lesions.  Destruction of lesion - right forehead x 1 Complexity: simple   Destruction method: cryotherapy   Informed consent: discussed and consent obtained   Timeout:  patient name, date of birth, surgical site, and procedure verified Lesion destroyed using liquid nitrogen: Yes   Region frozen until ice  ball extended beyond lesion: Yes   Outcome: patient tolerated procedure well with no complications   Post-procedure details: wound care instructions given    Inflamed seborrheic keratosis Right Temple x 1 Symptomatic, irritating, patient would like treated. Destruction of lesion - Right Temple x 1 Complexity: simple   Destruction method: cryotherapy   Informed consent: discussed and consent obtained   Timeout:  patient name, date of birth, surgical site, and procedure verified Lesion destroyed using liquid nitrogen: Yes   Region frozen until ice ball extended beyond lesion: Yes   Outcome: patient tolerated procedure well with no complications   Post-procedure details: wound care instructions given   Additional details:  Prior to procedure, discussed risks of blister formation, small wound, skin dyspigmentation, or rare scar following cryotherapy. Recommend Vaseline ointment to treated areas while healing.  Vitiligo Arms; trunk Vitiligo is a chronic autoimmune condition which causes loss of skin pigment and is commonly seen on the face and may also involve areas of trauma like hands, elbows, knees, and ankles. There is no cure and it is difficult to treat.  Treatments include topical steroids and other topical anti-inflammatory ointments/creams and topical and oral Jak inhibitors.  Sometimes narrow band UV light therapy or Xtrac laser is helpful, both of which require twice weekly treatments for at least 3-6 months.  Antioxidant vitamins, such as Vitamins A,C,E,D, Folic Acid and B12 may be added to enhance treatment.  Discussed Opzelura cream treatment - Pt declines tx.   chronic headaches. -Continue work-up with PCP  Return for dec to  feb follow up. Raymond Bennett, CMA, am acting as scribe for Raymond Ser, MD.  Documentation: I have reviewed the above documentation for accuracy and completeness, and I agree with the above.  Raymond Ser, MD

## 2021-08-25 ENCOUNTER — Encounter: Payer: Self-pay | Admitting: Dermatology

## 2021-08-31 ENCOUNTER — Other Ambulatory Visit: Payer: Self-pay | Admitting: Internal Medicine

## 2021-08-31 DIAGNOSIS — Z8572 Personal history of non-Hodgkin lymphomas: Secondary | ICD-10-CM

## 2021-08-31 DIAGNOSIS — G8929 Other chronic pain: Secondary | ICD-10-CM

## 2021-09-02 ENCOUNTER — Ambulatory Visit
Admission: RE | Admit: 2021-09-02 | Discharge: 2021-09-02 | Disposition: A | Discharge status: Home / Self Care | Payer: Medicare PPO | Source: Ambulatory Visit | Attending: Internal Medicine | Admitting: Internal Medicine

## 2021-09-02 DIAGNOSIS — G8929 Other chronic pain: Secondary | ICD-10-CM | POA: Diagnosis present

## 2021-09-02 DIAGNOSIS — R519 Headache, unspecified: Secondary | ICD-10-CM | POA: Insufficient documentation

## 2021-09-02 DIAGNOSIS — Z8572 Personal history of non-Hodgkin lymphomas: Secondary | ICD-10-CM | POA: Diagnosis present

## 2021-09-02 MED ORDER — IOHEXOL 300 MG/ML  SOLN
75.0000 mL | Freq: Once | INTRAMUSCULAR | Status: AC | PRN
  Administered 2021-09-02: 75 mL via INTRAVENOUS

## 2021-09-07 ENCOUNTER — Other Ambulatory Visit: Payer: Self-pay | Admitting: Dermatology

## 2021-11-18 ENCOUNTER — Other Ambulatory Visit: Payer: Self-pay | Admitting: Neurology

## 2021-11-18 DIAGNOSIS — R519 Headache, unspecified: Secondary | ICD-10-CM

## 2021-11-19 ENCOUNTER — Ambulatory Visit
Admission: RE | Admit: 2021-11-19 | Discharge: 2021-11-19 | Disposition: A | Discharge status: Home / Self Care | Payer: Medicare PPO | Source: Ambulatory Visit | Attending: Neurology | Admitting: Neurology

## 2021-11-19 DIAGNOSIS — R519 Headache, unspecified: Secondary | ICD-10-CM | POA: Diagnosis present

## 2022-03-24 ENCOUNTER — Ambulatory Visit: Payer: Medicare PPO | Admitting: Dermatology

## 2022-03-24 DIAGNOSIS — Z85828 Personal history of other malignant neoplasm of skin: Secondary | ICD-10-CM | POA: Diagnosis not present

## 2022-03-24 DIAGNOSIS — D485 Neoplasm of uncertain behavior of skin: Secondary | ICD-10-CM

## 2022-03-24 DIAGNOSIS — L80 Vitiligo: Secondary | ICD-10-CM | POA: Diagnosis not present

## 2022-03-24 DIAGNOSIS — Z1283 Encounter for screening for malignant neoplasm of skin: Secondary | ICD-10-CM | POA: Diagnosis not present

## 2022-03-24 DIAGNOSIS — D492 Neoplasm of unspecified behavior of bone, soft tissue, and skin: Secondary | ICD-10-CM

## 2022-03-24 DIAGNOSIS — D229 Melanocytic nevi, unspecified: Secondary | ICD-10-CM

## 2022-03-24 DIAGNOSIS — L821 Other seborrheic keratosis: Secondary | ICD-10-CM

## 2022-03-24 DIAGNOSIS — L578 Other skin changes due to chronic exposure to nonionizing radiation: Secondary | ICD-10-CM

## 2022-03-24 DIAGNOSIS — L814 Other melanin hyperpigmentation: Secondary | ICD-10-CM

## 2022-03-24 NOTE — Progress Notes (Signed)
Follow-Up Visit   Subjective  Raymond Bennett. is a 77 y.o. male who presents for the following: Total body skin exam (Hx of BCC, AKs). The patient presents for Total-Body Skin Exam (TBSE) for skin cancer screening and mole check.  The patient has spots, moles and lesions to be evaluated, some may be new or changing and the patient has concerns that these could be cancer.   The following portions of the chart were reviewed this encounter and updated as appropriate:   Tobacco  Allergies  Meds  Problems  Med Hx  Surg Hx  Fam Hx     Review of Systems:  No other skin or systemic complaints except as noted in HPI or Assessment and Plan.  Objective  Well appearing patient in no apparent distress; mood and affect are within normal limits.  A full examination was performed including scalp, head, eyes, ears, nose, lips, neck, chest, axillae, abdomen, back, buttocks, bilateral upper extremities, bilateral lower extremities, hands, feet, fingers, toes, fingernails, and toenails. All findings within normal limits unless otherwise noted below.  trunk, extremities Hypopigmented patches trunk, extremities  R lat neck Crusted patch 0.8cm      Assessment & Plan   Lentigines - Scattered tan macules - Due to sun exposure - Benign-appearing, observe - Recommend daily broad spectrum sunscreen SPF 30+ to sun-exposed areas, reapply every 2 hours as needed. - Call for any changes  Seborrheic Keratoses - Stuck-on, waxy, tan-brown papules and/or plaques  - Benign-appearing - Discussed benign etiology and prognosis. - Observe - Call for any changes  Melanocytic Nevi - Tan-brown and/or pink-flesh-colored symmetric macules and papules - Benign appearing on exam today - Observation - Call clinic for new or changing moles - Recommend daily use of broad spectrum spf 30+ sunscreen to sun-exposed areas.   Hemangiomas - Red papules - Discussed benign nature - Observe - Call for any  changes  Actinic Damage - Chronic condition, secondary to cumulative UV/sun exposure - diffuse scaly erythematous macules with underlying dyspigmentation - Recommend daily broad spectrum sunscreen SPF 30+ to sun-exposed areas, reapply every 2 hours as needed.  - Staying in the shade or wearing long sleeves, sun glasses (UVA+UVB protection) and wide brim hats (4-inch brim around the entire circumference of the hat) are also recommended for sun protection.  - Call for new or changing lesions.  Skin cancer screening performed today.   History of Basal Cell Carcinoma of the Skin - No evidence of recurrence today - Recommend regular full body skin exams - Recommend daily broad spectrum sunscreen SPF 30+ to sun-exposed areas, reapply every 2 hours as needed.  - Call if any new or changing lesions are noted between office visits  - multiple  History of PreCancerous Actinic Keratosis  - site(s) of PreCancerous Actinic Keratosis clear today. - these may recur and new lesions may form requiring treatment to prevent transformation into skin cancer - observe for new or changing spots and contact [facility] for appointment if occur - photoprotection with sun protective clothing; sunglasses and broad spectrum sunscreen with SPF of at least 30 + and frequent self skin exams recommended - yearly exams by a dermatologist recommended for persons with history of PreCancerous Actinic Keratoses   Vitiligo trunk, extremities  Vitiligo is a chronic autoimmune condition which causes loss of skin pigment and is commonly seen on the face and may also involve areas of trauma like hands, elbows, knees, and ankles. There is no cure and it  is difficult to treat.  Treatments include topical steroids and other topical anti-inflammatory ointments/creams and topical and oral Jak inhibitors.  Sometimes narrow band UV light therapy or Xtrac laser is helpful, both of which require twice weekly treatments for at  least 3-6 months.  Antioxidant vitamins, such as Vitamins A,C,E,D, Folic Acid and B12 may be added to enhance treatment.  Neoplasm of skin R lat neck  Epidermal / dermal shaving  Lesion diameter (cm):  0.8 Informed consent: discussed and consent obtained   Timeout: patient name, date of birth, surgical site, and procedure verified   Procedure prep:  Patient was prepped and draped in usual sterile fashion Prep type:  Isopropyl alcohol Anesthesia: the lesion was anesthetized in a standard fashion   Anesthetic:  1% lidocaine w/ epinephrine 1-100,000 buffered w/ 8.4% NaHCO3 Instrument used: flexible razor blade   Hemostasis achieved with: pressure, aluminum chloride and electrodesiccation   Outcome: patient tolerated procedure well   Post-procedure details: sterile dressing applied and wound care instructions given   Dressing type: bandage and bacitracin    Specimen 1 - Surgical pathology Differential Diagnosis: D48.5 R/O BCC  Check Margins: yes Crusted patch 0.8cm   Return in about 1 year (around 03/25/2023) for TBSE, Hx of BCC, Hx of AKs.  I, Othelia Pulling, RMA, am acting as scribe for Sarina Ser, MD . Documentation: I have reviewed the above documentation for accuracy and completeness, and I agree with the above.  Sarina Ser, MD

## 2022-03-24 NOTE — Patient Instructions (Addendum)
Wound Care Instructions  Cleanse wound gently with soap and water once a day then pat dry with clean gauze. Apply a thin coat of Petrolatum (petroleum jelly, "Vaseline") over the wound (unless you have an allergy to this). We recommend that you use a new, sterile tube of Vaseline. Do not pick or remove scabs. Do not remove the yellow or white "healing tissue" from the base of the wound.  Cover the wound with fresh, clean, nonstick gauze and secure with paper tape. You may use Band-Aids in place of gauze and tape if the wound is small enough, but would recommend trimming much of the tape off as there is often too much. Sometimes Band-Aids can irritate the skin.  You should call the office for your biopsy report after 1 week if you have not already been contacted.  If you experience any problems, such as abnormal amounts of bleeding, swelling, significant bruising, significant pain, or evidence of infection, please call the office immediately.  FOR ADULT SURGERY PATIENTS: If you need something for pain relief you may take 1 extra strength Tylenol (acetaminophen) AND 2 Ibuprofen (200mg each) together every 4 hours as needed for pain. (do not take these if you are allergic to them or if you have a reason you should not take them.) Typically, you may only need pain medication for 1 to 3 days.     Due to recent changes in healthcare laws, you may see results of your pathology and/or laboratory studies on MyChart before the doctors have had a chance to review them. We understand that in some cases there may be results that are confusing or concerning to you. Please understand that not all results are received at the same time and often the doctors may need to interpret multiple results in order to provide you with the best plan of care or course of treatment. Therefore, we ask that you please give us 2 business days to thoroughly review all your results before contacting the office for clarification. Should  we see a critical lab result, you will be contacted sooner.   If You Need Anything After Your Visit  If you have any questions or concerns for your doctor, please call our main line at [phone] and press option 4 to reach your doctor's medical assistant. If no one answers, please leave a voicemail as directed and we will return your call as soon as possible. Messages left after 4 pm will be answered the following business day.   You may also send us a message via MyChart. We typically respond to MyChart messages within 1-2 business days.  For prescription refills, please ask your pharmacy to contact our office. Our fax number is [phone].  If you have an urgent issue when the clinic is closed that cannot wait until the next business day, you can page your doctor at the number below.    Please note that while we do our best to be available for urgent issues outside of office hours, we are not available 24/7.   If you have an urgent issue and are unable to reach us, you may choose to seek medical care at your doctor's office, retail clinic, urgent care center, or emergency room.  If you have a medical emergency, please immediately call 911 or go to the emergency department.  Pager Numbers  - Dr. Kowalski: [phone]  - Dr. Moye: [phone]  - Dr. Stewart: [phone]  In the event of inclement weather, please call our main line at   [phone] for an update on the status of any delays or closures.  Dermatology Medication Tips: Please keep the boxes that topical medications come in in order to help keep track of the instructions about where and how to use these. Pharmacies typically print the medication instructions only on the boxes and not directly on the medication tubes.   If your medication is too expensive, please contact our office at [phone] option 4 or send us a message through MyChart.   We are unable to tell what your co-pay for medications will be in  advance as this is different depending on your insurance coverage. However, we may be able to find a substitute medication at lower cost or fill out paperwork to get insurance to cover a needed medication.   If a prior authorization is required to get your medication covered by your insurance company, please allow us 1-2 business days to complete this process.  Drug prices often vary depending on where the prescription is filled and some pharmacies may offer cheaper prices.  The website www.goodrx.com contains coupons for medications through different pharmacies. The prices here do not account for what the cost may be with help from insurance (it may be cheaper with your insurance), but the website can give you the price if you did not use any insurance.  - You can print the associated coupon and take it with your prescription to the pharmacy.  - You may also stop by our office during regular business hours and pick up a GoodRx coupon card.  - If you need your prescription sent electronically to a different pharmacy, notify our office through [place] MyChart or by phone at [phone] option 4.     Si Usted Necesita Algo Despus de Su Visita  Tambin puede enviarnos un mensaje a travs de MyChart. Por lo general respondemos a los mensajes de MyChart en el transcurso de 1 a 2 das hbiles.  Para renovar recetas, por favor pida a su farmacia que se ponga en contacto con nuestra oficina. Nuestro nmero de fax es el [phone].  Si tiene un asunto urgente cuando la clnica est cerrada y que no puede esperar hasta el siguiente da hbil, puede llamar/localizar a su doctor(a) al nmero que aparece a continuacin.   Por favor, tenga en cuenta que aunque hacemos todo lo posible para estar disponibles para asuntos urgentes fuera del horario de oficina, no estamos disponibles las 24 horas del da, los 7 das de la semana.   Si tiene un problema urgente y no puede comunicarse con nosotros, puede  optar por buscar atencin mdica  en el consultorio de su doctor(a), en una clnica privada, en un centro de atencin urgente o en una sala de emergencias.  Si tiene una emergencia mdica, por favor llame inmediatamente al 911 o vaya a la sala de emergencias.  Nmeros de bper  - Dr. Kowalski: [phone]  - Dra. Moye: [phone]  - Dra. Stewart: [phone]  En caso de inclemencias del tiempo, por favor llame a nuestra lnea principal al [phone] para una actualizacin sobre el estado de cualquier retraso o cierre.  Consejos para la medicacin en dermatologa: Por favor, guarde las cajas en las que vienen los medicamentos de uso tpico para ayudarle a seguir las instrucciones sobre dnde y cmo usarlos. Las farmacias generalmente imprimen las instrucciones del medicamento slo en las cajas y no directamente en los tubos del medicamento.   Si su medicamento es muy caro, por favor, pngase en contacto con   con Zigmund Daniel llamando al [phone] y presione la opcin 4 o envenos un mensaje a travs de Pharmacist, community.   No podemos decirle cul ser su copago por los medicamentos por adelantado ya que esto es diferente dependiendo de la cobertura de su seguro. Sin embargo, es posible que podamos encontrar un medicamento sustituto a Electrical engineer un formulario para que el seguro cubra el medicamento que se considera necesario.   Si se requiere una autorizacin previa para que su compaa de seguros [place] su medicamento, por favor permtanos de 1 a 2 das hbiles para completar este proceso.  Los precios de los medicamentos varan con frecuencia dependiendo del Environmental consultant de dnde se surte la receta y alguna farmacias pueden ofrecer precios ms baratos.  El sitio web www.goodrx.com tiene cupones para medicamentos de Airline pilot. Los precios aqu no tienen en cuenta lo que podra costar con la ayuda del seguro (puede ser ms barato con su seguro), pero el sitio web puede darle el  precio si no utiliz Research scientist (physical sciences).  - Puede imprimir el cupn correspondiente y llevarlo con su receta a la farmacia.  - Tambin puede pasar por nuestra oficina durante el horario de atencin regular y Charity fundraiser una tarjeta de cupones de GoodRx.  - Si necesita que su receta se enve electrnicamente a una farmacia diferente, informe a nuestra oficina a travs de MyChart de [place] o por telfono llamando al [phone] y presione la opcin 4.    Due to recent changes in healthcare laws, you may see results of your pathology and/or laboratory studies on MyChart before the doctors have had a chance to review them. We understand that in some cases there may be results that are confusing or concerning to you. Please understand that not all results are received at the same time and often the doctors may need to interpret multiple results in order to provide you with the best plan of care or course of treatment. Therefore, we ask that you please give [place] 2 business days to thoroughly review all your results before contacting the office for clarification. Should we see a critical lab result, you will be contacted sooner.   If You Need Anything After Your Visit  If you have any questions or concerns for your doctor, please call our main line at [phone] and press option 4 to reach your doctor's medical assistant. If no one answers, please leave a voicemail as directed and we will return your call as soon as possible. Messages left after 4 pm will be answered the following business day.   You may also send [place] a message via [place]. We typically respond to MyChart messages within 1-2 business days.  For prescription refills, please ask your pharmacy to contact our office. Our fax number is [phone].  If you have an urgent issue when the clinic is closed that cannot wait until the next business day, you can page your doctor at the number below.    Please note that while we do our best to be  available for urgent issues outside of office hours, we are not available 24/7.   If you have an urgent issue and are unable to reach [place], you may choose to seek medical care at your doctor's office, retail clinic, urgent care center, or emergency room.  If you have a medical emergency, please immediately call 911 or go to the emergency department.  Pager Numbers  - Dr. Nehemiah Massed: [phone]  - Dr. Laurence Ferrari: [phone]  -  Dr. Nicole Kindred: [phone]  In the event of inclement weather, please call our main line at [phone] for an update on the status of any delays or closures.  Dermatology Medication Tips: Please keep the boxes that topical medications come in in order to help keep track of the instructions about where and how to use these. Pharmacies typically print the medication instructions only on the boxes and not directly on the medication tubes.   If your medication is too expensive, please contact our office at [phone] option 4 or send [place] a message through [place].   We are unable to tell what your co-pay for medications will be in advance as this is different depending on your insurance coverage. However, we may be able to find a substitute medication at lower cost or fill out paperwork to get insurance to cover a needed medication.   If a prior authorization is required to get your medication covered by your insurance company, please allow [place] 1-2 business days to complete this process.  Drug prices often vary depending on where the prescription is filled and some pharmacies may offer cheaper prices.  The website www.goodrx.com contains coupons for medications through different pharmacies. The prices here do not account for what the cost may be with help from insurance (it may be cheaper with your insurance), but the website can give you the price if you did not use any insurance.  - You can print the associated coupon and take it with your prescription to the pharmacy.  -  You may also stop by our office during regular business hours and pick up a GoodRx coupon card.  - If you need your prescription sent electronically to a different pharmacy, notify our office through [facility] or by phone at [phone] option 4.     Si Usted Necesita Algo Despus de Su Visita  Tambin puede enviarnos un mensaje a travs de Pharmacist, community. Por lo general respondemos a los mensajes de MyChart en el transcurso de 1 a 2 das hbiles.  Para renovar recetas, por favor pida a su farmacia que se ponga en contacto con nuestra oficina. Harland Dingwall de fax es [place] [phone].  Si tiene un asunto urgente cuando la clnica est cerrada y que no puede esperar hasta el siguiente da hbil, puede llamar/localizar a su doctor(a) al nmero que aparece a continuacin.   Por favor, tenga en cuenta que aunque hacemos todo lo posible para estar disponibles para asuntos urgentes fuera del horario de [place], no estamos disponibles las 24 horas del da, los 7 das de la [place].   Si tiene un problema urgente y no puede comunicarse con nosotros, puede optar por buscar atencin mdica  en el consultorio de su doctor(a), en una clnica privada, en un centro de atencin urgente o en una sala de emergencias.  Si tiene Engineering geologist, por favor llame inmediatamente al 911 o vaya a la sala de emergencias.  Nmeros de bper  - Dr. Nehemiah Massed: [phone]  - Dra. Moye: [phone]  - Dra. Nicole Kindred: [phone]  En caso de inclemencias del [place], por favor llame a Johnsie Kindred principal al [phone] para una actualizacin sobre el [place] de cualquier retraso o cierre.  Consejos para la medicacin en dermatologa: Por favor, guarde las cajas en las que vienen los medicamentos de uso tpico para ayudarle a seguir las instrucciones sobre dnde y cmo usarlos. Las farmacias generalmente imprimen las instrucciones del medicamento slo en las cajas y no directamente en los tubos del  medicamento.   Si su medicamento es muy caro, por favor, pngase en contacto con Zigmund Daniel llamando al [phone] y presione la opcin 4 o envenos un mensaje a travs de Pharmacist, community.   No podemos decirle cul ser su copago por los medicamentos por adelantado ya que esto es diferente dependiendo de la cobertura de su seguro. Sin embargo, es posible que podamos encontrar un medicamento sustituto a Electrical engineer un formulario para que el seguro cubra el medicamento que se considera necesario.   Si se requiere una autorizacin previa para que su compaa de seguros [place] su medicamento, por favor permtanos de 1 a 2 das hbiles para completar este proceso.  Los precios de los medicamentos varan con frecuencia dependiendo del Environmental consultant de dnde se surte la receta y alguna farmacias pueden ofrecer precios ms baratos.  El sitio web www.goodrx.com tiene cupones para medicamentos de Airline pilot. Los precios aqu no tienen en cuenta lo que podra costar con la ayuda del seguro (puede ser ms barato con su seguro), pero el sitio web puede darle el precio si no utiliz Research scientist (physical sciences).  - Puede imprimir el cupn correspondiente y llevarlo con su receta a la farmacia.  - Tambin puede pasar por nuestra oficina durante el horario de atencin regular y Charity fundraiser una tarjeta de cupones de GoodRx.  - Si necesita que su receta se enve electrnicamente a una farmacia diferente, informe a nuestra oficina a travs de MyChart de [place] o por telfono llamando al [phone] y presione la opcin 4.

## 2022-03-30 ENCOUNTER — Telehealth: Payer: Self-pay

## 2022-03-30 NOTE — Telephone Encounter (Signed)
Patient returned Pearl call and I notified him of his bx results

## 2022-03-30 NOTE — Telephone Encounter (Signed)
-----  Message from Ralene Bathe, MD sent at 03/29/2022  2:48 PM EST ----- Diagnosis Skin , R lat neck PIGMENTED SEBORRHEIC KERATOSIS  Benign keratosis No further treatment needed

## 2022-04-06 ENCOUNTER — Encounter: Payer: Self-pay | Admitting: Dermatology

## 2022-06-07 ENCOUNTER — Other Ambulatory Visit: Payer: Self-pay | Admitting: Dermatology

## 2022-08-24 LAB — LAB REPORT - SCANNED: EGFR: 55

## 2022-10-08 ENCOUNTER — Telehealth (HOSPITAL_COMMUNITY): Payer: Self-pay | Admitting: *Deleted

## 2022-10-08 ENCOUNTER — Other Ambulatory Visit: Payer: Self-pay | Admitting: Internal Medicine

## 2022-10-08 DIAGNOSIS — I429 Cardiomyopathy, unspecified: Secondary | ICD-10-CM

## 2022-10-08 NOTE — Telephone Encounter (Signed)
Reaching out to patient to offer assistance regarding upcoming cardiac imaging study; pt verbalizes understanding of appt date/time, parking situation and where to check in, pre-test NPO status and verified current allergies; name and call back number provided for further questions should they arise  Gordy Clement RN Navigator Cardiac Imaging [facility] [phone] office [phone] cell  Patient to take daily medications.

## 2022-10-11 ENCOUNTER — Ambulatory Visit
Admission: RE | Admit: 2022-10-11 | Discharge: 2022-10-11 | Disposition: A | Discharge status: Home / Self Care | Payer: Medicare PPO | Source: Ambulatory Visit | Attending: Internal Medicine | Admitting: Internal Medicine

## 2022-10-11 DIAGNOSIS — I429 Cardiomyopathy, unspecified: Secondary | ICD-10-CM | POA: Insufficient documentation

## 2022-10-11 LAB — POCT I-STAT CREATININE: Creatinine, Ser: 1.8 mg/dL — ABNORMAL HIGH (ref 0.61–1.24)

## 2022-10-11 MED ORDER — NITROGLYCERIN 0.4 MG SL SUBL
0.8000 mg | SUBLINGUAL_TABLET | Freq: Once | SUBLINGUAL | Status: AC
  Administered 2022-10-11: 0.8 mg via SUBLINGUAL
  Filled 2022-10-11: qty 25

## 2022-10-11 MED ORDER — IOHEXOL 350 MG/ML SOLN
100.0000 mL | Freq: Once | INTRAVENOUS | Status: AC | PRN
  Administered 2022-10-11: 100 mL via INTRAVENOUS

## 2022-10-11 NOTE — Progress Notes (Signed)
Patient tolerated procedure well. Ambulate w/o difficulty. Denies any lightheadedness or being dizzy. Pt denies any pain at this time. Sitting in chair. Pt is encouraged to drink additional water throughout the day and reason explained to patient. Patient verbalized understanding and all questions answered. ABC intact. No further needs at this time. Discharge from procedure area w/o issues. 

## 2022-10-20 ENCOUNTER — Ambulatory Visit: Payer: Medicare PPO | Admitting: Dermatology

## 2022-10-20 VITALS — BP 114/64

## 2022-10-20 DIAGNOSIS — C4441 Basal cell carcinoma of skin of scalp and neck: Secondary | ICD-10-CM

## 2022-10-20 DIAGNOSIS — L578 Other skin changes due to chronic exposure to nonionizing radiation: Secondary | ICD-10-CM

## 2022-10-20 DIAGNOSIS — L820 Inflamed seborrheic keratosis: Secondary | ICD-10-CM | POA: Diagnosis not present

## 2022-10-20 DIAGNOSIS — D492 Neoplasm of unspecified behavior of bone, soft tissue, and skin: Secondary | ICD-10-CM

## 2022-10-20 DIAGNOSIS — W908XXA Exposure to other nonionizing radiation, initial encounter: Secondary | ICD-10-CM

## 2022-10-20 NOTE — Patient Instructions (Addendum)

## 2022-10-20 NOTE — Progress Notes (Signed)
Follow-Up Visit   Subjective  Raymond Bennett. is a 77 y.o. male who presents for the following: check spot R neck, 97m, hx of bleeding, hx of ISK R ear, pt says needs treating again,  The patient has spots, moles and lesions to be evaluated, some may be new or changing and the patient may have concern these could be cancer.  The following portions of the chart were reviewed this encounter and updated as appropriate: medications, allergies, medical history  Review of Systems:  No other skin or systemic complaints except as noted in HPI or Assessment and Plan.  Objective  Well appearing patient in no apparent distress; mood and affect are within normal limits.  A focused examination was performed of the following areas: R neck, R ear  Relevant exam findings are noted in the Assessment and Plan.  R lat neck 1.1cm bleeding pink pap         Right Ear concha and crus area x 1 Stuck on waxy paps with erythema       Assessment & Plan   Neoplasm of skin R lat neck  Epidermal / dermal shaving  Lesion diameter (cm):  1.1 Informed consent: discussed and consent obtained   Timeout: patient name, date of birth, surgical site, and procedure verified   Procedure prep:  Patient was prepped and draped in usual sterile fashion Prep type:  Isopropyl alcohol Anesthesia: the lesion was anesthetized in a standard fashion   Anesthetic:  1% lidocaine w/ epinephrine 1-100,000 buffered w/ 8.4% NaHCO3 Instrument used: flexible razor blade   Hemostasis achieved with: pressure, aluminum chloride and electrodesiccation   Outcome: patient tolerated procedure well   Post-procedure details: sterile dressing applied and wound care instructions given   Dressing type: bandage and bacitracin    Destruction of lesion Complexity: extensive   Destruction method: electrodesiccation and curettage   Informed consent: discussed and consent obtained   Timeout:  patient name, date of birth, surgical  site, and procedure verified Procedure prep:  Patient was prepped and draped in usual sterile fashion Prep type:  Isopropyl alcohol Anesthesia: the lesion was anesthetized in a standard fashion   Anesthetic:  1% lidocaine w/ epinephrine 1-100,000 buffered w/ 8.4% NaHCO3 Curettage performed in three different directions: Yes   Electrodesiccation performed over the curetted area: Yes   Lesion length (cm):  1.1 Lesion width (cm):  1.1 Margin per side (cm):  0.2 Final wound size (cm):  1.5 Hemostasis achieved with:  pressure, aluminum chloride and electrodesiccation Outcome: patient tolerated procedure well with no complications   Post-procedure details: sterile dressing applied and wound care instructions given   Dressing type: bandage and bacitracin    Specimen 1 - Surgical pathology Differential Diagnosis: D48.5 R/O BCC  Check Margins: yes 1.1cm bleeding pink pap EDC  Inflamed seborrheic keratosis Right Ear concha and crus area x 1  Symptomatic, irritating, patient would like treated.  Destruction of lesion - Right Ear concha and crus area x 1 Complexity: simple   Destruction method: cryotherapy   Informed consent: discussed and consent obtained   Timeout:  patient name, date of birth, surgical site, and procedure verified Lesion destroyed using liquid nitrogen: Yes   Region frozen until ice ball extended beyond lesion: Yes   Outcome: patient tolerated procedure well with no complications   Post-procedure details: wound care instructions given    ACTINIC DAMAGE - chronic, secondary to cumulative UV radiation exposure/sun exposure over time - diffuse scaly erythematous macules with underlying  dyspigmentation - Recommend daily broad spectrum sunscreen SPF 30+ to sun-exposed areas, reapply every 2 hours as needed.  - Recommend staying in the shade or wearing long sleeves, sun glasses (UVA+UVB protection) and wide brim hats (4-inch brim around the entire circumference of the  hat). - Call for new or changing lesions.  Return for as scheduled for TBSE, hx of BCC, hx of AKs recheck ISK R ear concha/crus area,.  I, Sonya Hupman, RMA, am acting as scribe for Armida Sans, MD .   Documentation: I have reviewed the above documentation for accuracy and completeness, and I agree with the above.  Armida Sans, MD

## 2022-10-26 ENCOUNTER — Telehealth: Payer: Self-pay

## 2022-10-26 NOTE — Telephone Encounter (Signed)
-----   Message from Armida Sans sent at 10/25/2022  5:46 PM EDT ----- Diagnosis Skin , right lat neck BASAL CELL CARCINOMA, NODULAR PATTERN, ULCERATED, DEEP MARGIN INVOLVED  Cancer = BCC Already treated Recheck next visit

## 2022-10-26 NOTE — Telephone Encounter (Signed)
Tried calling patient about bx results, no answer, no vm./sh

## 2022-10-26 NOTE — Telephone Encounter (Signed)
No voicemail set up

## 2022-10-28 ENCOUNTER — Telehealth: Payer: Self-pay

## 2022-10-28 NOTE — Telephone Encounter (Signed)
-----   Message from Armida Sans sent at 10/25/2022  5:46 PM EDT ----- Diagnosis Skin , right lat neck BASAL CELL CARCINOMA, NODULAR PATTERN, ULCERATED, DEEP MARGIN INVOLVED  Cancer = BCC Already treated Recheck next visit

## 2022-10-28 NOTE — Telephone Encounter (Signed)
Patient informed of pathology results 

## 2022-10-31 ENCOUNTER — Encounter: Payer: Self-pay | Admitting: Dermatology

## 2022-11-02 ENCOUNTER — Encounter: Payer: Self-pay | Admitting: Cardiology

## 2022-11-02 ENCOUNTER — Ambulatory Visit
Admission: RE | Admit: 2022-11-02 | Discharge: 2022-11-02 | Disposition: A | Discharge status: Home / Self Care | Payer: Medicare PPO | Attending: Cardiology | Admitting: Cardiology

## 2022-11-02 ENCOUNTER — Encounter
Admission: RE | Disposition: A | Discharge status: Home / Self Care | Payer: Self-pay | Source: Home / Self Care | Attending: Cardiology

## 2022-11-02 ENCOUNTER — Other Ambulatory Visit: Payer: Self-pay

## 2022-11-02 DIAGNOSIS — R001 Bradycardia, unspecified: Secondary | ICD-10-CM | POA: Insufficient documentation

## 2022-11-02 DIAGNOSIS — Z006 Encounter for examination for normal comparison and control in clinical research program: Secondary | ICD-10-CM | POA: Insufficient documentation

## 2022-11-02 DIAGNOSIS — I441 Atrioventricular block, second degree: Secondary | ICD-10-CM | POA: Insufficient documentation

## 2022-11-02 DIAGNOSIS — Z01818 Encounter for other preprocedural examination: Secondary | ICD-10-CM

## 2022-11-02 HISTORY — PX: PACEMAKER LEADLESS INSERTION: EP1219

## 2022-11-02 SURGERY — PACEMAKER LEADLESS INSERTION
Anesthesia: Moderate Sedation

## 2022-11-02 MED ORDER — IOHEXOL 300 MG/ML  SOLN
INTRAMUSCULAR | Status: DC | PRN
  Administered 2022-11-02: 9 mL

## 2022-11-02 MED ORDER — FENTANYL CITRATE (PF) 100 MCG/2ML IJ SOLN
INTRAMUSCULAR | Status: DC | PRN
  Administered 2022-11-02: 25 ug via INTRAVENOUS

## 2022-11-02 MED ORDER — SODIUM CHLORIDE 0.9% FLUSH: 3.0000 mL | INTRAVENOUS | Status: DC | PRN

## 2022-11-02 MED ORDER — LIDOCAINE HCL 1 % IJ SOLN
INTRAMUSCULAR | Status: AC
  Filled 2022-11-02: qty 20

## 2022-11-02 MED ORDER — HEPARIN SODIUM (PORCINE) 1000 UNIT/ML IJ SOLN
INTRAMUSCULAR | Status: AC
  Filled 2022-11-02: qty 10

## 2022-11-02 MED ORDER — SODIUM CHLORIDE 0.9 % IV SOLN: INTRAVENOUS | Status: DC

## 2022-11-02 MED ORDER — MIDAZOLAM HCL 2 MG/2ML IJ SOLN
INTRAMUSCULAR | Status: DC | PRN
  Administered 2022-11-02: 1 mg via INTRAVENOUS

## 2022-11-02 MED ORDER — FENTANYL CITRATE (PF) 100 MCG/2ML IJ SOLN
INTRAMUSCULAR | Status: AC
  Filled 2022-11-02: qty 2

## 2022-11-02 MED ORDER — ACETAMINOPHEN 325 MG PO TABS
650.0000 mg | ORAL_TABLET | ORAL | Status: DC | PRN
  Administered 2022-11-02: 650 mg via ORAL

## 2022-11-02 MED ORDER — SODIUM CHLORIDE 0.9% FLUSH: 3.0000 mL | Freq: Two times a day (BID) | INTRAVENOUS | Status: DC

## 2022-11-02 MED ORDER — TORSEMIDE 20 MG PO TABS
20.0000 mg | ORAL_TABLET | Freq: Every day | ORAL | Status: DC
Start: 2022-11-02 — End: 2022-11-02

## 2022-11-02 MED ORDER — HEPARIN SODIUM (PORCINE) 1000 UNIT/ML IJ SOLN
INTRAMUSCULAR | Status: DC | PRN
  Administered 2022-11-02: 5000 [IU] via INTRAVENOUS

## 2022-11-02 MED ORDER — MIDAZOLAM HCL 2 MG/2ML IJ SOLN
INTRAMUSCULAR | Status: AC
  Filled 2022-11-02: qty 2

## 2022-11-02 MED ORDER — ACETAMINOPHEN 325 MG PO TABS
ORAL_TABLET | ORAL | Status: AC
  Filled 2022-11-02: qty 2

## 2022-11-02 MED ORDER — LIDOCAINE HCL (PF) 1 % IJ SOLN
INTRAMUSCULAR | Status: DC | PRN
  Administered 2022-11-02: 15 mL

## 2022-11-02 MED ORDER — HEPARIN (PORCINE) IN NACL 2000-0.9 UNIT/L-% IV SOLN
INTRAVENOUS | Status: DC | PRN
  Administered 2022-11-02: 1000 mL

## 2022-11-02 MED ORDER — SODIUM CHLORIDE 0.9 % IV SOLN: 250.0000 mL | INTRAVENOUS | Status: DC | PRN

## 2022-11-02 MED ORDER — ONDANSETRON HCL 4 MG/2ML IJ SOLN: 4.0000 mg | Freq: Four times a day (QID) | INTRAMUSCULAR | Status: DC | PRN

## 2022-11-02 MED ORDER — HEPARIN (PORCINE) IN NACL 1000-0.9 UT/500ML-% IV SOLN
INTRAVENOUS | Status: AC
  Filled 2022-11-02: qty 1000

## 2022-11-02 MED ORDER — MAGNESIUM OXIDE -MG SUPPLEMENT 400 (240 MG) MG PO TABS: 400.0000 mg | ORAL_TABLET | Freq: Every day | ORAL | Status: DC

## 2022-11-02 MED ORDER — LOSARTAN POTASSIUM 50 MG PO TABS: 50.0000 mg | ORAL_TABLET | Freq: Every day | ORAL | Status: DC

## 2022-11-02 SURGICAL SUPPLY — 16 items
DILATOR VESSEL 38 20CM 12FR (INTRODUCER) IMPLANT
DILATOR VESSEL 38 20CM 14FR (INTRODUCER) IMPLANT
DILATOR VESSEL 38 20CM 18FR (INTRODUCER) IMPLANT
DILATOR VESSEL 38 20CM 8FR (INTRODUCER) IMPLANT
KIT SYRINGE INJ CVI SPIKEX1 (MISCELLANEOUS) IMPLANT
MICRA INTRODUCER SHEATH (SHEATH) ×1
NDL PERC 18GX7CM (NEEDLE) IMPLANT
NEEDLE PERC 18GX7CM (NEEDLE) ×1 IMPLANT
PACEMAKER LEADLESS AV2 MICRA (Pacemaker) IMPLANT
PACK CARDIAC CATH (CUSTOM PROCEDURE TRAY) ×1 IMPLANT
PAD ELECT DEFIB RADIOL ZOLL (MISCELLANEOUS) IMPLANT
PROTECTION STATION PRESSURIZED (MISCELLANEOUS) ×1
SHEATH AVANTI 7FRX11 (SHEATH) IMPLANT
SHEATH INTRODUCER MICRA (SHEATH) IMPLANT
STATION PROTECTION PRESSURIZED (MISCELLANEOUS) IMPLANT
WIRE AMPLATZ SS-J .035X180CM (WIRE) IMPLANT

## 2022-11-02 NOTE — Discharge Instructions (Addendum)
Pacemaker Discharge Instructions Please avoid showering until Thursday 11/04/2022 and do not submerge yourself in water (baths, oceans, swimming pools) for the next week. If your bandage gets wet or starts to fall off, replace it with another piece of gauze and the Tegaderm (clear bandage I provided). You should try to keep it covered for a week. You will follow up with Dr. Juliann Pares in the office in about 1 week. If you have bleeding from the site, lie down and apply firm pressure for 20 minutes, if it continues to bleed, call our office [phone]). Avoid heavy lifting, squatting (other than sitting) and strenuous activity for 1 week. You will get a call from [employer] to set up your pacemaker and the CareLink device. You do not need to bring this device with you to appointments. It is used to monitor your pacemaker from home.

## 2022-11-03 ENCOUNTER — Encounter: Payer: Self-pay | Admitting: Cardiology

## 2023-02-19 LAB — LAB REPORT - SCANNED
EGFR: 39
TSH: 3.49 (ref 0.41–5.90)

## 2023-03-15 ENCOUNTER — Other Ambulatory Visit: Payer: Self-pay | Admitting: Dermatology

## 2023-03-29 ENCOUNTER — Encounter: Payer: Self-pay | Admitting: Dermatology

## 2023-03-29 ENCOUNTER — Ambulatory Visit: Payer: Medicare PPO | Admitting: Dermatology

## 2023-03-29 DIAGNOSIS — D229 Melanocytic nevi, unspecified: Secondary | ICD-10-CM

## 2023-03-29 DIAGNOSIS — Z1283 Encounter for screening for malignant neoplasm of skin: Secondary | ICD-10-CM

## 2023-03-29 DIAGNOSIS — L814 Other melanin hyperpigmentation: Secondary | ICD-10-CM | POA: Diagnosis not present

## 2023-03-29 DIAGNOSIS — Z79899 Other long term (current) drug therapy: Secondary | ICD-10-CM

## 2023-03-29 DIAGNOSIS — L578 Other skin changes due to chronic exposure to nonionizing radiation: Secondary | ICD-10-CM

## 2023-03-29 DIAGNOSIS — D1801 Hemangioma of skin and subcutaneous tissue: Secondary | ICD-10-CM

## 2023-03-29 DIAGNOSIS — Z7189 Other specified counseling: Secondary | ICD-10-CM

## 2023-03-29 DIAGNOSIS — L649 Androgenic alopecia, unspecified: Secondary | ICD-10-CM

## 2023-03-29 DIAGNOSIS — L821 Other seborrheic keratosis: Secondary | ICD-10-CM

## 2023-03-29 DIAGNOSIS — L820 Inflamed seborrheic keratosis: Secondary | ICD-10-CM | POA: Diagnosis not present

## 2023-03-29 DIAGNOSIS — L80 Vitiligo: Secondary | ICD-10-CM

## 2023-03-29 DIAGNOSIS — W908XXA Exposure to other nonionizing radiation, initial encounter: Secondary | ICD-10-CM | POA: Diagnosis not present

## 2023-03-29 DIAGNOSIS — Z85828 Personal history of other malignant neoplasm of skin: Secondary | ICD-10-CM

## 2023-03-29 NOTE — Patient Instructions (Signed)

## 2023-03-29 NOTE — Progress Notes (Signed)
Follow-Up Visit   Subjective  Raymond Bennett. is a 78 y.o. male who presents for the following: Skin Cancer Screening and Full Body Skin Exam  The patient presents for Total-Body Skin Exam (TBSE) for skin cancer screening and mole check. The patient has spots, moles and lesions to be evaluated, some may be new or changing and the patient may have concern these could be cancer.  The following portions of the chart were reviewed this encounter and updated as appropriate: medications, allergies, medical history  Review of Systems:  No other skin or systemic complaints except as noted in HPI or Assessment and Plan.  Objective  Well appearing patient in no apparent distress; mood and affect are within normal limits.  A full examination was performed including scalp, head, eyes, ears, nose, lips, neck, chest, axillae, abdomen, back, buttocks, bilateral upper extremities, bilateral lower extremities, hands, feet, fingers, toes, fingernails, and toenails. All findings within normal limits unless otherwise noted below.   Relevant physical exam findings are noted in the Assessment and Plan.  L arm x 1 Erythematous stuck-on, waxy papule or plaque  Assessment & Plan   SKIN CANCER SCREENING PERFORMED TODAY.  ACTINIC DAMAGE - Chronic condition, secondary to cumulative UV/sun exposure - diffuse scaly erythematous macules with underlying dyspigmentation - Recommend daily broad spectrum sunscreen SPF 30+ to sun-exposed areas, reapply every 2 hours as needed.  - Staying in the shade or wearing long sleeves, sun glasses (UVA+UVB protection) and wide brim hats (4-inch brim around the entire circumference of the hat) are also recommended for sun protection.  - Call for new or changing lesions.  LENTIGINES, SEBORRHEIC KERATOSES, HEMANGIOMAS - Benign normal skin lesions - Benign-appearing - Call for any changes  MELANOCYTIC NEVI - Tan-brown and/or pink-flesh-colored symmetric macules and  papules - Benign appearing on exam today - Observation - Call clinic for new or changing moles - Recommend daily use of broad spectrum spf 30+ sunscreen to sun-exposed areas.   HISTORY OF BASAL CELL CARCINOMA OF THE SKIN - No evidence of recurrence today - Recommend regular full body skin exams - Recommend daily broad spectrum sunscreen SPF 30+ to sun-exposed areas, reapply every 2 hours as needed.  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 Currently well-controlled.  Androgenetic Alopecia (or Male pattern hair loss) refers to the common patterned hair loss affecting many men.  Male pattern alopecia is mediated by dihydrotestosterone which induces miniaturization of androgen-sensitive hair follicles.  It is chronic and persistent, but treatable; not curable. Topical treatment includes: - 5% topical Minoxidil Oral treatment includes: - Finasteride 1 mg qd - Minoxidil 1.25 - 5 mg qd - Dutasteride 0.5 mg qd Adjunct therapy includes: - Low Level Laser Light Therapy (LLLT) - Platelet-rich Plasma injections (PRP) - Hair Transplantation or scalp reduction  Treatment Plan: Continue Finasteride 1 mg po QD.   Long term medication management.  Patient is using long term (months to years) prescription medication  to control their dermatologic condition.  These medications require periodic monitoring to evaluate for efficacy and side effects and may require periodic laboratory monitoring.   INFLAMED SEBORRHEIC KERATOSIS L arm x 1 Symptomatic, irritating, patient would like treated. Destruction of lesion - L arm x 1 Complexity: simple   Destruction method: cryotherapy   Informed consent: discussed  and consent obtained   Timeout:  patient name, date of birth, surgical  site, and procedure verified Lesion destroyed using liquid nitrogen: Yes   Region frozen until ice ball extended beyond lesion: Yes   Outcome: patient tolerated procedure well with no complications   Post-procedure details: wound care instructions given    VITILIGO Exam: depigmented patches  Vitiligo is a chronic autoimmune condition which causes loss of skin pigment and is commonly seen on the face and may also involve areas of trauma like hands, elbows, knees, and ankles. There is no cure and it is difficult to treat.  Treatments include topical steroids and other topical anti-inflammatory ointments/creams and topical and oral Jak inhibitors.  Sometimes narrow band UV light therapy or Xtrac laser is helpful, both of which require twice weekly treatments for at least 3-6 months.  Antioxidant vitamins, such as Vitamins A,C,E,D, Folic Acid and B12 may be added to enhance treatment. Heliocare may also enhance treatment results.  Treatment Plan: Pt defers tx.  Return in about 1 year (around 03/28/2024) for TBSE.  Maylene Roes, CMA, am acting as scribe for Armida Sans, MD .  Documentation: I have reviewed the above documentation for accuracy and completeness, and I agree with the above.  Armida Sans, MD

## 2023-03-30 ENCOUNTER — Ambulatory Visit: Payer: Medicare PPO | Admitting: Dermatology

## 2023-04-29 NOTE — Progress Notes (Signed)
 [facility].  See chart review for scanned document of complete results.

## 2023-06-22 ENCOUNTER — Ambulatory Visit: Admission: RE | Admit: 2023-06-22 | Discharge: 2023-06-22 | Disposition: A | Discharge status: Home / Self Care

## 2023-06-22 ENCOUNTER — Other Ambulatory Visit: Payer: Self-pay

## 2023-06-22 ENCOUNTER — Encounter: Admission: RE | Disposition: A | Discharge status: Home / Self Care | Payer: Self-pay | Source: Home / Self Care

## 2023-06-22 ENCOUNTER — Encounter: Payer: Self-pay | Admitting: Internal Medicine

## 2023-06-22 DIAGNOSIS — I25110 Atherosclerotic heart disease of native coronary artery with unstable angina pectoris: Secondary | ICD-10-CM | POA: Diagnosis not present

## 2023-06-22 DIAGNOSIS — R0602 Shortness of breath: Secondary | ICD-10-CM | POA: Diagnosis not present

## 2023-06-22 DIAGNOSIS — I200 Unstable angina: Secondary | ICD-10-CM

## 2023-06-22 DIAGNOSIS — I441 Atrioventricular block, second degree: Secondary | ICD-10-CM | POA: Diagnosis not present

## 2023-06-22 DIAGNOSIS — I429 Cardiomyopathy, unspecified: Secondary | ICD-10-CM | POA: Diagnosis not present

## 2023-06-22 HISTORY — PX: LEFT HEART CATH AND CORONARY ANGIOGRAPHY: CATH118249

## 2023-06-22 HISTORY — PX: CORONARY STENT INTERVENTION: CATH118234

## 2023-06-22 HISTORY — PX: CORONARY ULTRASOUND/IVUS: CATH118244

## 2023-06-22 LAB — POCT ACTIVATED CLOTTING TIME
Activated Clotting Time: 233 s
Activated Clotting Time: 273 s
Activated Clotting Time: 256 s

## 2023-06-22 SURGERY — LEFT HEART CATH AND CORONARY ANGIOGRAPHY
Anesthesia: Moderate Sedation

## 2023-06-22 MED ORDER — HYDRALAZINE HCL 20 MG/ML IJ SOLN: 10.0000 mg | INTRAMUSCULAR | Status: DC | PRN

## 2023-06-22 MED ORDER — ASPIRIN 81 MG PO CHEW
CHEWABLE_TABLET | ORAL | Status: DC | PRN
  Administered 2023-06-22: 243 mg via ORAL

## 2023-06-22 MED ORDER — SODIUM CHLORIDE 0.9% FLUSH: 3.0000 mL | Freq: Two times a day (BID) | INTRAVENOUS | Status: DC

## 2023-06-22 MED ORDER — MIDAZOLAM HCL 2 MG/2ML IJ SOLN
INTRAMUSCULAR | Status: AC
  Filled 2023-06-22: qty 2

## 2023-06-22 MED ORDER — ACETAMINOPHEN 325 MG PO TABS: 650.0000 mg | ORAL_TABLET | ORAL | Status: DC | PRN

## 2023-06-22 MED ORDER — SODIUM CHLORIDE 0.9 % IV BOLUS
INTRAVENOUS | Status: DC | PRN
  Administered 2023-06-22: 500 mL via INTRAVENOUS

## 2023-06-22 MED ORDER — IOHEXOL 300 MG/ML  SOLN
INTRAMUSCULAR | Status: DC | PRN
  Administered 2023-06-22: 100 mL

## 2023-06-22 MED ORDER — HEPARIN SODIUM (PORCINE) 1000 UNIT/ML IJ SOLN
INTRAMUSCULAR | Status: AC
  Filled 2023-06-22: qty 10

## 2023-06-22 MED ORDER — FENTANYL CITRATE (PF) 100 MCG/2ML IJ SOLN
INTRAMUSCULAR | Status: AC
  Filled 2023-06-22: qty 2

## 2023-06-22 MED ORDER — ASPIRIN 81 MG PO TBEC: 81.0000 mg | DELAYED_RELEASE_TABLET | Freq: Every day | ORAL | 0 refills | Status: DC

## 2023-06-22 MED ORDER — ASPIRIN 81 MG PO CHEW
81.0000 mg | CHEWABLE_TABLET | ORAL | Status: AC
Start: 2023-06-23 — End: 2023-06-22
  Administered 2023-06-22: 81 mg via ORAL

## 2023-06-22 MED ORDER — VERAPAMIL HCL 2.5 MG/ML IV SOLN
INTRAVENOUS | Status: DC | PRN
  Administered 2023-06-22: 2.5 mg via INTRAVENOUS

## 2023-06-22 MED ORDER — SODIUM CHLORIDE 0.9% FLUSH: 3.0000 mL | INTRAVENOUS | Status: DC | PRN

## 2023-06-22 MED ORDER — SODIUM CHLORIDE 0.9 % WEIGHT BASED INFUSION: 1.0000 mL/kg/h | INTRAVENOUS | Status: DC

## 2023-06-22 MED ORDER — ASPIRIN 81 MG PO CHEW: 81.0000 mg | CHEWABLE_TABLET | Freq: Every day | ORAL | Status: DC

## 2023-06-22 MED ORDER — ASPIRIN 81 MG PO CHEW
CHEWABLE_TABLET | ORAL | Status: AC
  Filled 2023-06-22: qty 3

## 2023-06-22 MED ORDER — SODIUM CHLORIDE 0.9 % WEIGHT BASED INFUSION
3.0000 mL/kg/h | INTRAVENOUS | Status: AC
  Administered 2023-06-22: 3 mL/kg/h via INTRAVENOUS

## 2023-06-22 MED ORDER — SODIUM CHLORIDE 0.9 % IV SOLN: 250.0000 mL | INTRAVENOUS | Status: DC | PRN

## 2023-06-22 MED ORDER — HEPARIN (PORCINE) IN NACL 1000-0.9 UT/500ML-% IV SOLN
INTRAVENOUS | Status: AC
  Filled 2023-06-22: qty 1000

## 2023-06-22 MED ORDER — VERAPAMIL HCL 2.5 MG/ML IV SOLN
INTRAVENOUS | Status: AC
  Filled 2023-06-22: qty 2

## 2023-06-22 MED ORDER — NITROGLYCERIN 1 MG/10 ML FOR IR/CATH LAB
INTRA_ARTERIAL | Status: AC
Start: 2023-06-22 — End: ?
  Filled 2023-06-22: qty 10

## 2023-06-22 MED ORDER — ONDANSETRON HCL 4 MG/2ML IJ SOLN: 4.0000 mg | Freq: Four times a day (QID) | INTRAMUSCULAR | Status: DC | PRN

## 2023-06-22 MED ORDER — CLOPIDOGREL BISULFATE 75 MG PO TABS: 75.0000 mg | ORAL_TABLET | Freq: Every day | ORAL | 0 refills | Status: AC

## 2023-06-22 MED ORDER — CLOPIDOGREL BISULFATE 75 MG PO TABS
ORAL_TABLET | ORAL | Status: DC | PRN
Start: 2023-06-22 — End: 2023-06-22
  Administered 2023-06-22: 600 mg via ORAL

## 2023-06-22 MED ORDER — CLOPIDOGREL BISULFATE 75 MG PO TABS: 75.0000 mg | ORAL_TABLET | Freq: Every day | ORAL | Status: DC

## 2023-06-22 MED ORDER — HEPARIN (PORCINE) IN NACL 2000-0.9 UNIT/L-% IV SOLN
INTRAVENOUS | Status: DC | PRN
  Administered 2023-06-22: 1000 mL

## 2023-06-22 MED ORDER — HEPARIN SODIUM (PORCINE) 1000 UNIT/ML IJ SOLN
INTRAMUSCULAR | Status: DC | PRN
  Administered 2023-06-22: 2000 [IU] via INTRAVENOUS
  Administered 2023-06-22: 3000 [IU] via INTRAVENOUS
  Administered 2023-06-22: 5000 [IU] via INTRAVENOUS
  Administered 2023-06-22 (×2): 3000 [IU] via INTRAVENOUS

## 2023-06-22 MED ORDER — CLOPIDOGREL BISULFATE 75 MG PO TABS
ORAL_TABLET | ORAL | Status: AC
  Filled 2023-06-22: qty 8

## 2023-06-22 MED ORDER — MIDAZOLAM HCL 2 MG/2ML IJ SOLN
INTRAMUSCULAR | Status: DC | PRN
  Administered 2023-06-22 (×2): 1 mg via INTRAVENOUS

## 2023-06-22 MED ORDER — FENTANYL CITRATE (PF) 100 MCG/2ML IJ SOLN
INTRAMUSCULAR | Status: DC | PRN
  Administered 2023-06-22 (×2): 50 ug via INTRAVENOUS

## 2023-06-22 MED ORDER — LIDOCAINE HCL (PF) 1 % IJ SOLN
INTRAMUSCULAR | Status: DC | PRN
  Administered 2023-06-22: 2 mL

## 2023-06-22 MED ORDER — LIDOCAINE HCL 1 % IJ SOLN
INTRAMUSCULAR | Status: AC
  Filled 2023-06-22: qty 20

## 2023-06-22 MED ORDER — NITROGLYCERIN 1 MG/10 ML FOR IR/CATH LAB
INTRA_ARTERIAL | Status: DC | PRN
  Administered 2023-06-22: 200 ug

## 2023-06-22 MED ORDER — SODIUM CHLORIDE 0.9% FLUSH
3.0000 mL | Freq: Two times a day (BID) | INTRAVENOUS | Status: DC
Start: 2023-06-22 — End: 2023-06-22

## 2023-06-22 MED ORDER — ASPIRIN 81 MG PO CHEW
CHEWABLE_TABLET | ORAL | Status: AC
  Filled 2023-06-22: qty 1

## 2023-06-22 SURGICAL SUPPLY — 34 items
BALLN TREK RX 2.25X12 (BALLOONS) ×2 IMPLANT
BALLN TREK RX 2.5X20 (BALLOONS) ×2 IMPLANT
BALLN ~~LOC~~ EUPHORA RX 2.25X12 (BALLOONS) ×2 IMPLANT
BALLN ~~LOC~~ EUPHORA RX 2.75X20 (BALLOONS) ×2 IMPLANT
BALLN ~~LOC~~ TREK NEO RX 2.75X12 (BALLOONS) ×2 IMPLANT
BALLN ~~LOC~~ TREK NEO RX 2.75X15 (BALLOONS) ×2 IMPLANT
BALLOON TAKERU 2.0X12 (BALLOONS) IMPLANT
BALLOON TREK RX 2.25X12 (BALLOONS) IMPLANT
BALLOON TREK RX 2.5X20 (BALLOONS) IMPLANT
BALLOON ~~LOC~~ EUPHORA RX 2.25X12 (BALLOONS) IMPLANT
BALLOON ~~LOC~~ EUPHORA RX 2.75X20 (BALLOONS) IMPLANT
BALLOON ~~LOC~~ TREK NEO RX 2.75X12 (BALLOONS) IMPLANT
BALLOON ~~LOC~~ TREK NEO RX 2.75X15 (BALLOONS) IMPLANT
CATH 5FR JL3.5 JR4 ANG PIG MP (CATHETERS) IMPLANT
CATH EAGLE EYE PLAT IMAGING (CATHETERS) IMPLANT
CATH VISTA GUIDE 6FR JL3.5 MPK (CATHETERS) IMPLANT
DEVICE RAD TR BAND REGULAR (VASCULAR PRODUCTS) IMPLANT
DRAPE BRACHIAL (DRAPES) IMPLANT
GLIDESHEATH SLEND SS 6F .021 (SHEATH) IMPLANT
GUIDEWIRE INQWIRE 1.5J.035X260 (WIRE) IMPLANT
INQWIRE 1.5J .035X260CM (WIRE) ×2 IMPLANT
KIT ENCORE 26 ADVANTAGE (KITS) IMPLANT
KIT SYRINGE INJ CVI SPIKEX1 (MISCELLANEOUS) IMPLANT
PACK CARDIAC CATH (CUSTOM PROCEDURE TRAY) ×2 IMPLANT
PROTECTION STATION PRESSURIZED (MISCELLANEOUS) ×2 IMPLANT
SET ATX-X65L (MISCELLANEOUS) IMPLANT
STATION PROTECTION PRESSURIZED (MISCELLANEOUS) IMPLANT
STENT ONYX FRONTIER 2.25X08 (Permanent Stent) IMPLANT
STENT ONYX FRONTIER 2.25X12 (Permanent Stent) IMPLANT
STENT ONYX FRONTIER 2.75X22 (Permanent Stent) IMPLANT
TUBING CIL FLEX 10 FLL-RA (TUBING) IMPLANT
VALVE COPILOT STAT (MISCELLANEOUS) IMPLANT
WIRE ASAHI PROWATER 180CM (WIRE) IMPLANT
WIRE RUNTHROUGH .014X180CM (WIRE) IMPLANT

## 2023-06-22 NOTE — Procedures (Signed)
 Procedure: Coronary angiography, percutaneous coronary mention to LAD/diagonal bifurcation with intravascular ultrasound.  Description of procedure: Following informed consent, the patient was brought to the Cath Lab, and the left wrist was prepped and draped in a sterile fashion.  Following the administration of lidocaine, access was obtained in the left radial artery utilizing active ultrasound guidance.  We then inserted a 69F slender sheath.  Coronary angiography was performed in multiple directions using a 70F JR4 catheter to engage the right coronary and a 70F JL 3.5 catheter to engage the left coronary.  Hemodynamics: 131/48 mmHg.  LV function: Left ventriculography deferred.  Coronary findings: Coronary angiography in this right dominant system demonstrated severe one-vessel CAD.  The LMCA had mild luminal irregularities.  The LAD had a severe lesion in its mid segment that involves the takeoff of a moderate-sized first diagonal branch.  The mid to distal LAD had mild diffuse disease.  The LCx had mild diffuse disease.  The RCA had mild diffuse disease.  Interventional details: Initial angiography revealed a severe stenosis involving the mid LAD and first diagonal branch.  We planned to treat this with bifurcation PCI.  A 69F sheath was in place.  Heparin was administered for anticoagulation.  A 69F JL 3.5 guide provide adequate support.  A run-through wire across the LAD lesion with little difficulty.  A [place] wire crossed into the diagonal branch with little difficulty.  We then predilated the diagonal branch with a 2.25 x 12 mm balloon in the LAD with a 2.5 x 20 mm compliant balloon.  We then delivered a 2.25 x 8 mm Onyx frontier stent from the diagonal branch back to the LAD.  We then crossed this with a 2.75 x 20 mm noncompliant balloon.  We then delivered a 2.75 x 22 mm Onyx frontier stent to the LAD.  We then optimize this with a 2.75 x 20 mm noncompliant balloon.  We then performed  intravascular sound.  This demonstrated good stent apposition and expansion as well as no evidence of distal or proximal edge dissection.  We then swapped the wires.  We then open the stent struts into the diagonal branch with a 2.0 x 12 mm compliant balloon.  We then performed sequential and kissing balloon inflation with a 2.75 x 12 mm noncompliant balloon in the LAD and a 2.25 x 12 mm noncompliant balloon in the diagonal branch.  Final angiography revealed no residual stenosis, no evidence of dissection and TIMI-3 flow in the vessel.  The wire, guide and sheath were removed, and a TR band was applied with adequate hemostasis.  Summary and recommendations:  1.  Severe one-vessel CAD 2.  Successful bifurcation PCI of the LAD/D1 bifurcation with a 2.75 x 22 mm Onyx frontier stent in the LAD and a 2.25 x 8 mm Onyx frontier stent in the diagonal branch with intravascular ultrasound guidance and final kissing balloon inflation 3.  Aspirin and clopidogrel for at least 1 year followed by aspirin indefinitely

## 2023-06-22 NOTE — Progress Notes (Signed)
 Dr. Philippa Bray in at bedside, speaking with pt. Re: cath procedure and stents. MD spoke with Mrs. Barletta via telephone. Both verbalized complete understanding of conversation with MD.

## 2023-06-22 NOTE — Progress Notes (Signed)
 Unable to save 12 lead EKG on monitor: problems with [employer] system this AM. Rudi Corwin, PA here & aware. Ronald Cockayne, PA took a picture with her own phone to show MD the EKG before Cath.

## 2023-06-22 NOTE — Progress Notes (Addendum)
 Ronald Cockayne, PA in at bedside to see pt. To clear for DC home. Pt. OOB, ambulated and went to [facility]. Stable for DC home. RX for plavix and ASA to be sent by PA now.

## 2023-07-04 ENCOUNTER — Other Ambulatory Visit: Payer: Self-pay

## 2023-07-04 ENCOUNTER — Encounter: Attending: Internal Medicine

## 2023-07-04 DIAGNOSIS — Z955 Presence of coronary angioplasty implant and graft: Secondary | ICD-10-CM | POA: Insufficient documentation

## 2023-07-04 NOTE — Progress Notes (Signed)
 Virtual Visit completed. Patient informed on EP and RD appointment and 6 Minute walk test. Patient also informed of patient health questionnaires on My Chart. Patient Verbalizes understanding. Visit diagnosis can be found in [facility] 06/22/2023.

## 2023-07-06 ENCOUNTER — Encounter

## 2023-07-06 VITALS — Ht 68.11 in | Wt 183.1 lb

## 2023-07-06 DIAGNOSIS — Z955 Presence of coronary angioplasty implant and graft: Secondary | ICD-10-CM | POA: Diagnosis present

## 2023-07-06 NOTE — Progress Notes (Signed)
 Cardiac Individual Treatment Plan  Patient Details  Name: Raymond Bennett. MRN: [id number] Date of Birth: 1945/11/29 Referring Provider:   Flowsheet Row Cardiac Rehab from 07/06/2023 in [facility] Cardiac and Pulmonary Rehab  Referring Provider Marco Severs, MD       Initial Encounter Date:  Flowsheet Row Cardiac Rehab from 07/06/2023 in [facility] Cardiac and Pulmonary Rehab  Date 07/06/23       Visit Diagnosis: Status post coronary artery stent placement  Patient's Home Medications on Admission:  Current Outpatient Medications:    acetaminophen  (TYLENOL ) 500 MG tablet, Take 500 mg by mouth. (Patient not taking: Reported on 07/04/2023), Disp: , Rfl:    Acidophilus Lactobacillus CAPS, Take 1 capsule by mouth every morning. (Patient not taking: Reported on 07/04/2023), Disp: , Rfl:    amoxicillin-clavulanate (AUGMENTIN) 875-125 MG tablet, Take 1 tablet by mouth 2 (two) times daily. (Patient not taking: Reported on 07/04/2023), Disp: , Rfl:    aspirin  EC 81 MG tablet, Take 1 tablet (81 mg total) by mouth daily. Swallow whole., Disp: 30 tablet, Rfl: 0   betamethasone dipropionate 0.05 % cream, , Disp: , Rfl:    carvedilol (COREG) 6.25 MG tablet, Take 6.25 mg by mouth. (Patient not taking: Reported on 07/04/2023), Disp: , Rfl:    cholecalciferol (VITAMIN D) 1000 units tablet, Take 1,000 Units by mouth in the morning., Disp: , Rfl:    clopidogrel  (PLAVIX ) 75 MG tablet, Take 1 tablet (75 mg total) by mouth daily., Disp: 30 tablet, Rfl: 0   doxycycline (VIBRAMYCIN) 100 MG capsule, , Disp: , Rfl:    empagliflozin (JARDIANCE) 10 MG TABS tablet, Take 10 mg by mouth in the morning. (Patient not taking: Reported on 07/04/2023), Disp: , Rfl:    empagliflozin (JARDIANCE) 10 MG TABS tablet, Take 1 tablet by mouth daily., Disp: , Rfl:    ferrous sulfate 325 (65 FE) MG tablet, Take 325 mg by mouth in the morning., Disp: , Rfl:    ferrous sulfate 325 (65 FE) MG tablet, Take 325 mg by mouth. (Patient not taking: Reported  on 07/04/2023), Disp: , Rfl:    finasteride (PROPECIA) 1 MG tablet, TAKE 1 TABLET BY MOUTH ONCE DAILY (Patient not taking: Reported on 07/04/2023), Disp: 90 tablet, Rfl: 2   finasteride (PROPECIA) 1 MG tablet, Take 1 tablet by mouth every morning., Disp: , Rfl:    HYDROcodone -acetaminophen  (NORCO/VICODIN) 5-325 MG tablet, , Disp: , Rfl:    ibuprofen  (ADVIL ) 200 MG tablet, Take 200 mg by mouth. (Patient not taking: Reported on 07/04/2023), Disp: , Rfl:    influenza vaccine (FLUCELVAX - EGG FREE) 0.5 ML injection, , Disp: , Rfl:    lisinopril (ZESTRIL) 20 MG tablet, Take 20 mg by mouth., Disp: , Rfl:    magnesium  oxide (MAG-OX) 400 MG tablet, Take 400 mg by mouth daily., Disp: , Rfl:    metoprolol tartrate (LOPRESSOR) 25 MG tablet, Take 25 mg by mouth every evening. (Patient not taking: Reported on 07/04/2023), Disp: , Rfl:    metoprolol tartrate (LOPRESSOR) 25 MG tablet, Take 1 tablet by mouth 2 (two) times daily., Disp: , Rfl:    Multiple Vitamins-Minerals (PRESERVISION AREDS 2 PO), Take 1 tablet by mouth in the morning and at bedtime., Disp: , Rfl:    nitroGLYCERIN  (NITROSTAT ) 0.4 MG SL tablet, Place 0.4 mg under the tongue every 5 (five) minutes x 3 doses as needed for chest pain., Disp: , Rfl:    omeprazole (PRILOSEC) 40 MG capsule, Take 40 mg by mouth daily  before breakfast. Reported on 08/06/2015, Disp: , Rfl:    Probiotic Product (PROBIOTIC PO), Take 1 capsule by mouth daily., Disp: , Rfl:    Red Yeast Rice 600 MG CAPS, Take 600 mg by mouth in the morning and at bedtime., Disp: , Rfl:    Red Yeast Rice Extract 600 MG TABS, Take by mouth., Disp: , Rfl:    rosuvastatin (CRESTOR) 20 MG tablet, Take 20 mg by mouth., Disp: , Rfl:    spironolactone (ALDACTONE) 25 MG tablet, Take 25 mg by mouth daily. (Patient not taking: Reported on 07/04/2023), Disp: , Rfl:    spironolactone (ALDACTONE) 25 MG tablet, Take 1 tablet by mouth daily., Disp: , Rfl:    torsemide  (DEMADEX ) 20 MG tablet, Take 40 mg by mouth.,  Disp: , Rfl:    triamcinolone  cream (KENALOG ) 0.1 %, , Disp: , Rfl:    vitamin B-12 (CYANOCOBALAMIN) 1000 MCG tablet, Take 1,000 mcg by mouth in the morning., Disp: , Rfl:   Past Medical History: Past Medical History:  Diagnosis Date   Basal cell carcinoma 01/17/2014   L lateral top of shoulder ant, L ant top of shoulder post   Basal cell carcinoma 01/01/2016   L mid back   Basal cell carcinoma 11/17/2017   R mid ear helix   Basal cell carcinoma 02/06/2019   L scalp/temple area within the hair above the sideburn/excision   Basal cell carcinoma 08/15/2019   Right chest medial infraclavicular. Superficial and nodular.    Basal cell carcinoma 08/15/2019   Left chest parasternal. Superficial.   Basal cell carcinoma 02/14/2020   L thigh above knee    Basal cell carcinoma 10/20/2022   R lat neck, EDC   Cancer (HCC)    Chronic kidney disease    renal cell carcinoma   GERD (gastroesophageal reflux disease)    History of colon polyps    History of gastric polyp    History of hiatal hernia    Neuropathy    Non Hodgkin's lymphoma (HCC)    PONV (postoperative nausea and vomiting)    "difficulty waking up after surgery"   Reflux    Renal mass     Tobacco Use: Social History   Tobacco Use  Smoking Status Never  Smokeless Tobacco Never    Labs: Review Flowsheet        No data to display           Exercise Target Goals: Exercise Program Goal: Individual exercise prescription set using results from initial 6 min walk test and THRR while considering  patient's activity barriers and safety.   Exercise Prescription Goal: Initial exercise prescription builds to 30-45 minutes a day of aerobic activity, 2-3 days per week.  Home exercise guidelines will be given to patient during program as part of exercise prescription that the participant will acknowledge.   Education: Aerobic Exercise: - Group verbal and visual presentation on the components of exercise prescription.  Introduces F.I.T.T principle from ACSM for exercise prescriptions.  Reviews F.I.T.T. principles of aerobic exercise including progression. Written material given at graduation.   Education: Resistance Exercise: - Group verbal and visual presentation on the components of exercise prescription. Introduces F.I.T.T principle from ACSM for exercise prescriptions  Reviews F.I.T.T. principles of resistance exercise including progression. Written material given at graduation.    Education: Exercise & Equipment Safety: - Individual verbal instruction and demonstration of equipment use and safety with use of the equipment. Flowsheet Row Cardiac Rehab from 07/06/2023 in [facility] Cardiac and  Pulmonary Rehab  Date 07/06/23  Educator MB  Instruction Review Code 1- Verbalizes Understanding       Education: Exercise Physiology & General Exercise Guidelines: - Group verbal and written instruction with models to review the exercise physiology of the cardiovascular system and associated critical values. Provides general exercise guidelines with specific guidelines to those with heart or lung disease.    Education: Flexibility, Balance, Mind/Body Relaxation: - Group verbal and visual presentation with interactive activity on the components of exercise prescription. Introduces F.I.T.T principle from ACSM for exercise prescriptions. Reviews F.I.T.T. principles of flexibility and balance exercise training including progression. Also discusses the mind body connection.  Reviews various relaxation techniques to help reduce and manage stress (i.e. Deep breathing, progressive muscle relaxation, and visualization). Balance handout provided to take home. Written material given at graduation.   Activity Barriers & Risk Stratification:  Activity Barriers & Cardiac Risk Stratification - 07/06/23 1137       Activity Barriers & Cardiac Risk Stratification   Activity Barriers None    Cardiac Risk Stratification Moderate              6 Minute Walk:  6 Minute Walk     Row Name 07/06/23 1135         6 Minute Walk   Phase Initial     Distance 1220 feet     Walk Time 6 minutes     # of Rest Breaks 0     MPH 2.31     METS 2.33     RPE 6     Perceived Dyspnea  0     VO2 Peak 8.16     Symptoms No     Resting HR 80 bpm     Resting BP 100/60     Resting Oxygen Saturation  100 %     Exercise Oxygen Saturation  during 6 min walk 97 %     Max Ex. HR 100 bpm     Max Ex. BP 120/62     2 Minute Post BP 118/68              Oxygen Initial Assessment:   Oxygen Re-Evaluation:   Oxygen Discharge (Final Oxygen Re-Evaluation):   Initial Exercise Prescription:  Initial Exercise Prescription - 07/06/23 1100       Date of Initial Exercise RX and Referring Provider   Date 07/06/23    Referring Provider Marco Severs, MD      Oxygen   Maintain Oxygen Saturation 88% or higher      NuStep   Level 2    SPM 80    Minutes 15    METs 2.33      Arm Ergometer   Level 1    RPM 30    Minutes 15    METs 2.33      REL-XR   Level 2    Speed 50    Minutes 15    METs 2.33      Track   Laps 33    Minutes 15    METs 2.79      Prescription Details   Frequency (times per week) 2    Duration Progress to 30 minutes of continuous aerobic without signs/symptoms of physical distress      Intensity   THRR 40-80% of Max Heartrate 104-128    Ratings of Perceived Exertion 11-13    Perceived Dyspnea 0-4      Progression   Progression Continue to progress workloads to  maintain intensity without signs/symptoms of physical distress.      Resistance Training   Training Prescription Yes    Weight 3    Reps 10-15             Perform Capillary Blood Glucose checks as needed.  Exercise Prescription Changes:   Exercise Prescription Changes     Row Name 07/06/23 1100             Response to Exercise   Blood Pressure (Admit) 100/60       Blood Pressure (Exercise) 120/62       Blood  Pressure (Exit) 118/68       Heart Rate (Admit) 80 bpm       Heart Rate (Exercise) 116 bpm       Heart Rate (Exit) 88 bpm       Oxygen Saturation (Admit) 100 %       Oxygen Saturation (Exercise) 97 %       Oxygen Saturation (Exit) 100 %       Rating of Perceived Exertion (Exercise) 6       Perceived Dyspnea (Exercise) 0       Symptoms none       Comments results       Duration Progress to 30 minutes of  aerobic without signs/symptoms of physical distress       Intensity THRR New         Progression   Progression Continue to progress workloads to maintain intensity without signs/symptoms of physical distress.       Average METs 2.33                Exercise Comments:   Exercise Goals and Review:   Exercise Goals     Row Name 07/06/23 1141             Exercise Goals   Increase Physical Activity Yes       Intervention Provide advice, education, support and counseling about physical activity/exercise needs.;Develop an individualized exercise prescription for aerobic and resistive training based on initial evaluation findings, risk stratification, comorbidities and participant's personal goals.       Expected Outcomes Short Term: Attend rehab on a regular basis to increase amount of physical activity.;Long Term: Add in home exercise to make exercise part of routine and to increase amount of physical activity.;Long Term: Exercising regularly at least 3-5 days a week.       Increase Strength and Stamina Yes       Intervention Provide advice, education, support and counseling about physical activity/exercise needs.;Develop an individualized exercise prescription for aerobic and resistive training based on initial evaluation findings, risk stratification, comorbidities and participant's personal goals.       Expected Outcomes Short Term: Increase workloads from initial exercise prescription for resistance, speed, and METs.;Short Term: Perform resistance training exercises  routinely during rehab and add in resistance training at home;Long Term: Improve cardiorespiratory fitness, muscular endurance and strength as measured by increased METs and functional capacity ( )       Able to understand and use rate of perceived exertion (RPE) scale Yes       Intervention Provide education and explanation on how to use RPE scale       Expected Outcomes Short Term: Able to use RPE daily in rehab to express subjective intensity level;Long Term:  Able to use RPE to guide intensity level when exercising independently       Able to understand and use Dyspnea  scale Yes       Intervention Provide education and explanation on how to use Dyspnea scale       Expected Outcomes Short Term: Able to use Dyspnea scale daily in rehab to express subjective sense of shortness of breath during exertion;Long Term: Able to use Dyspnea scale to guide intensity level when exercising independently       Knowledge and understanding of Target Heart Rate Range (THRR) Yes       Intervention Provide education and explanation of THRR including how the numbers were predicted and where they are located for reference       Expected Outcomes Short Term: Able to state/look up THRR;Short Term: Able to use daily as guideline for intensity in rehab;Long Term: Able to use THRR to govern intensity when exercising independently       Able to check pulse independently Yes       Intervention Provide education and demonstration on how to check pulse in carotid and radial arteries.;Review the importance of being able to check your own pulse for safety during independent exercise       Expected Outcomes Short Term: Able to explain why pulse checking is important during independent exercise;Long Term: Able to check pulse independently and accurately       Understanding of Exercise Prescription Yes       Intervention Provide education, explanation, and written materials on patient's individual exercise prescription        Expected Outcomes Short Term: Able to explain program exercise prescription;Long Term: Able to explain home exercise prescription to exercise independently                Exercise Goals Re-Evaluation :   Discharge Exercise Prescription (Final Exercise Prescription Changes):  Exercise Prescription Changes - 07/06/23 1100       Response to Exercise   Blood Pressure (Admit) 100/60    Blood Pressure (Exercise) 120/62    Blood Pressure (Exit) 118/68    Heart Rate (Admit) 80 bpm    Heart Rate (Exercise) 116 bpm    Heart Rate (Exit) 88 bpm    Oxygen Saturation (Admit) 100 %    Oxygen Saturation (Exercise) 97 %    Oxygen Saturation (Exit) 100 %    Rating of Perceived Exertion (Exercise) 6    Perceived Dyspnea (Exercise) 0    Symptoms none    Comments results    Duration Progress to 30 minutes of  aerobic without signs/symptoms of physical distress    Intensity THRR New      Progression   Progression Continue to progress workloads to maintain intensity without signs/symptoms of physical distress.    Average METs 2.33             Nutrition:  Target Goals: Understanding of nutrition guidelines, daily intake of sodium 1500mg , cholesterol 200mg , calories 30% from fat and 7% or less from saturated fats, daily to have 5 or more servings of fruits and vegetables.  Education: All About Nutrition: -Group instruction provided by verbal, written material, interactive activities, discussions, models, and posters to present general guidelines for heart healthy nutrition including fat, fiber, MyPlate, the role of sodium in heart healthy nutrition, utilization of the nutrition label, and utilization of this knowledge for meal planning. Follow up email sent as well. Written material given at graduation. Flowsheet Row Cardiac Rehab from 07/06/2023 in [facility] Cardiac and Pulmonary Rehab  Education need identified 07/06/23       Biometrics:  Pre Biometrics - 07/06/23  1142       Pre  Biometrics   Height 5' 8.11" (1.73 m)    Weight 183 lb 1.6 oz (83.1 kg)    Waist Circumference 40 inches    Hip Circumference 42 inches    Waist to Hip Ratio 0.95 %    BMI (Calculated) 27.75    Single Leg Stand 9 seconds              Nutrition Therapy Plan and Nutrition Goals:  Nutrition Therapy & Goals - 07/06/23 1151       Nutrition Therapy   RD appointment deferred Yes      Personal Nutrition Goals   Nutrition Goal RD appointment deferred at this time      Intervention Plan   Intervention Prescribe, educate and counsel regarding individualized specific dietary modifications aiming towards targeted core components such as weight, hypertension, lipid management, diabetes, heart failure and other comorbidities.    Expected Outcomes Short Term Goal: Understand basic principles of dietary content, such as calories, fat, sodium, cholesterol and nutrients.             Nutrition Assessments:  MEDIFICTS Score Key: >=70 Need to make dietary changes  40-70 Heart Healthy Diet <= 40 Therapeutic Level Cholesterol Diet  Flowsheet Row Cardiac Rehab from 07/06/2023 in [facility] Cardiac and Pulmonary Rehab  Picture Your Plate Total Score on Admission 51      Picture Your Plate Scores: <60 Unhealthy dietary pattern with much room for improvement. 41-50 Dietary pattern unlikely to meet recommendations for good health and room for improvement. 51-60 More healthful dietary pattern, with some room for improvement.  >60 Healthy dietary pattern, although there may be some specific behaviors that could be improved.    Nutrition Goals Re-Evaluation:   Nutrition Goals Discharge (Final Nutrition Goals Re-Evaluation):   Psychosocial: Target Goals: Acknowledge presence or absence of significant depression and/or stress, maximize coping skills, provide positive support system. Participant is able to verbalize types and ability to use techniques and skills needed for reducing stress and  depression.   Education: Stress, Anxiety, and Depression - Group verbal and visual presentation to define topics covered.  Reviews how body is impacted by stress, anxiety, and depression.  Also discusses healthy ways to reduce stress and to treat/manage anxiety and depression.  Written material given at graduation.   Education: Sleep Hygiene -Provides group verbal and written instruction about how sleep can affect your health.  Define sleep hygiene, discuss sleep cycles and impact of sleep habits. Review good sleep hygiene tips.    Initial Review & Psychosocial Screening:  Initial Psych Review & Screening - 07/04/23 1433       Initial Review   Current issues with None Identified      Family Dynamics   Good Support System? Yes    Comments Raymond Bennett can look to his wife and all of his family for support. He is ready to work on his conditioning.      Barriers   Psychosocial barriers to participate in program The patient should benefit from training in stress management and relaxation.;There are no identifiable barriers or psychosocial needs.      Screening Interventions   Interventions Encouraged to exercise;To provide support and resources with identified psychosocial needs;Provide feedback about the scores to participant    Expected Outcomes Long Term goal: The participant improves quality of Life and PHQ9 Scores as seen by post scores and/or verbalization of changes;Short Term goal: Identification and review with participant of any Quality  of Life or Depression concerns found by scoring the questionnaire.;Long Term Goal: Stressors or current issues are controlled or eliminated.;Short Term goal: Utilizing psychosocial counselor, staff and physician to assist with identification of specific Stressors or current issues interfering with healing process. Setting desired goal for each stressor or current issue identified.             Quality of Life Scores:   Quality of Life - 07/06/23 1143        Quality of Life   Select Quality of Life      Quality of Life Scores   Health/Function Pre 23.36 %    Socioeconomic Pre 25.36 %    Psych/Spiritual Pre 27.5 %    Family Pre 28.5 %    GLOBAL Pre 25.34 %            Scores of 19 and below usually indicate a poorer quality of life in these areas.  A difference of  2-3 points is a clinically meaningful difference.  A difference of 2-3 points in the total score of the Quality of Life Index has been associated with significant improvement in overall quality of life, self-image, physical symptoms, and general health in studies assessing change in quality of life.  PHQ-9: Review Flowsheet       07/06/2023  Depression screen PHQ 2/9  Decreased Interest 0  Down, Depressed, Hopeless 0  PHQ - 2 Score 0  Altered sleeping 0  Tired, decreased energy 3  Change in appetite 0  Feeling bad or failure about yourself  0  Trouble concentrating 0  Moving slowly or fidgety/restless 0  Suicidal thoughts 0  PHQ-9 Score 3  Difficult doing work/chores Somewhat difficult   Interpretation of Total Score  Total Score Depression Severity:  1-4 = Minimal depression, 5-9 = Mild depression, 10-14 = Moderate depression, 15-19 = Moderately severe depression, 20-27 = Severe depression   Psychosocial Evaluation and Intervention:  Psychosocial Evaluation - 07/04/23 1434       Psychosocial Evaluation & Interventions   Interventions Encouraged to exercise with the program and follow exercise prescription;Relaxation education;Stress management education    Comments Raymond Bennett can look to his wife and all of his family for support. He is ready to work on his conditioning.    Expected Outcomes Short: Start HeartTrack to help with mood. Long: Maintain a healthy mental state    Continue Psychosocial Services  Follow up required by staff             Psychosocial Re-Evaluation:   Psychosocial Discharge (Final Psychosocial Re-Evaluation):   Vocational  Rehabilitation: Provide vocational rehab assistance to qualifying candidates.   Vocational Rehab Evaluation & Intervention:   Education: Education Goals: Education classes will be provided on a variety of topics geared toward better understanding of heart health and risk factor modification. Participant will state understanding/return demonstration of topics presented as noted by education test scores.  Learning Barriers/Preferences:  Learning Barriers/Preferences - 07/04/23 1431       Learning Barriers/Preferences   Learning Barriers Sight    Learning Preferences None             General Cardiac Education Topics:  AED/CPR: - Group verbal and written instruction with the use of models to demonstrate the basic use of the AED with the basic ABC's of resuscitation.   Anatomy and Cardiac Procedures: - Group verbal and visual presentation and models provide information about basic cardiac anatomy and function. Reviews the testing methods done to diagnose heart disease  and the outcomes of the test results. Describes the treatment choices: Medical Management, Angioplasty, or Coronary Bypass Surgery for treating various heart conditions including Myocardial Infarction, Angina, Valve Disease, and Cardiac Arrhythmias.  Written material given at graduation. Flowsheet Row Cardiac Rehab from 07/06/2023 in [facility] Cardiac and Pulmonary Rehab  Education need identified 07/06/23       Medication Safety: - Group verbal and visual instruction to review commonly prescribed medications for heart and lung disease. Reviews the medication, class of the drug, and side effects. Includes the steps to properly store meds and maintain the prescription regimen.  Written material given at graduation.   Intimacy: - Group verbal instruction through game format to discuss how heart and lung disease can affect sexual intimacy. Written material given at graduation..   Know Your Numbers and Heart Failure: -  Group verbal and visual instruction to discuss disease risk factors for cardiac and pulmonary disease and treatment options.  Reviews associated critical values for Overweight/Obesity, Hypertension, Cholesterol, and Diabetes.  Discusses basics of heart failure: signs/symptoms and treatments.  Introduces Heart Failure Zone chart for action plan for heart failure.  Written material given at graduation. Flowsheet Row Cardiac Rehab from 07/06/2023 in [facility] Cardiac and Pulmonary Rehab  Education need identified 07/06/23       Infection Prevention: - Provides verbal and written material to individual with discussion of infection control including proper hand washing and proper equipment cleaning during exercise session. Flowsheet Row Cardiac Rehab from 07/06/2023 in [facility] Cardiac and Pulmonary Rehab  Date 07/06/23  Educator MB  Instruction Review Code 1- Verbalizes Understanding       Falls Prevention: - Provides verbal and written material to individual with discussion of falls prevention and safety. Flowsheet Row Cardiac Rehab from 07/06/2023 in [facility]  Date 07/06/23  Educator MB  Instruction Review Code 1- Verbalizes Understanding       Other: -Provides group and verbal instruction on various topics (see comments)   Knowledge Questionnaire Score:  Knowledge Questionnaire Score - 07/06/23 1153       Knowledge Questionnaire Score   Pre Score 22/26             Core Components/Risk Factors/Patient Goals at Admission:  Personal Goals and Risk Factors at Admission - 07/06/23 1153       Core Components/Risk Factors/Patient Goals on Admission    Weight Management Yes;Weight Loss    Intervention Weight Management: Develop a combined nutrition and exercise program designed to reach desired caloric intake, while maintaining appropriate intake of nutrient and fiber, sodium and fats, and appropriate energy expenditure required for the weight goal.;Weight  Management: Provide education and appropriate resources to help participant work on and attain dietary goals.;Weight Management/Obesity: Establish reasonable short term and long term weight goals.    Admit Weight 183 lb 1.6 oz (83.1 kg)    Goal Weight: Long Term 168 lb 1.6 oz (76.2 kg)    Expected Outcomes Short Term: Continue to assess and modify interventions until short term weight is achieved;Weight Loss: Understanding of general recommendations for a balanced deficit meal plan, which promotes 1-2 lb weight loss per week and includes a negative energy balance of [phone] kcal/d;Understanding recommendations for meals to include 15-35% energy as protein, 25-35% energy from fat, 35-60% energy from carbohydrates, less than 200mg  of dietary cholesterol, 20-35 gm of total fiber daily;Understanding of distribution of calorie intake throughout the day with the consumption of 4-5 meals/snacks;Long Term: Adherence to nutrition and physical activity/exercise program aimed  toward attainment of established weight goal             Education:Diabetes - Individual verbal and written instruction to review signs/symptoms of diabetes, desired ranges of glucose level fasting, after meals and with exercise. Acknowledge that pre and post exercise glucose checks will be done for 3 sessions at entry of program.   Core Components/Risk Factors/Patient Goals Review:    Core Components/Risk Factors/Patient Goals at Discharge (Final Review):    ITP Comments:  ITP Comments     Row Name 07/04/23 1430 07/06/23 1135         ITP Comments Virtual Visit completed. Patient informed on EP and RD appointment and 6 Minute walk test. Patient also informed of patient health questionnaires on My Chart. Patient Verbalizes understanding. Visit diagnosis can be found in [facility] 06/22/2023. Completed and gym orientation for cardiac rehab. Initial ITP created and sent for review to Dr. Firman Hughes, Medical Director.                Comments: Initial ITP

## 2023-07-06 NOTE — Patient Instructions (Signed)
 Patient Instructions  Patient Details  Name: Raymond Bennett. MRN: [id number] Date of Birth: 1946/01/29 Referring Provider:  Marco Severs, MD  Below are your personal goals for exercise, nutrition, and risk factors. Our goal is to help you stay on track towards obtaining and maintaining these goals. We will be discussing your progress on these goals with you throughout the program.  Initial Exercise Prescription:  Initial Exercise Prescription - 07/06/23 1100       Date of Initial Exercise RX and Referring Provider   Date 07/06/23    Referring Provider Marco Severs, MD      Oxygen   Maintain Oxygen Saturation 88% or higher      NuStep   Level 2    SPM 80    Minutes 15    METs 2.33      Arm Ergometer   Level 1    RPM 30    Minutes 15    METs 2.33      REL-XR   Level 2    Speed 50    Minutes 15    METs 2.33      Track   Laps 33    Minutes 15    METs 2.79      Prescription Details   Frequency (times per week) 2    Duration Progress to 30 minutes of continuous aerobic without signs/symptoms of physical distress      Intensity   THRR 40-80% of Max Heartrate 104-128    Ratings of Perceived Exertion 11-13    Perceived Dyspnea 0-4      Progression   Progression Continue to progress workloads to maintain intensity without signs/symptoms of physical distress.      Resistance Training   Training Prescription Yes    Weight 3    Reps 10-15             Exercise Goals: Frequency: Be able to perform aerobic exercise two to three times per week in program working toward 2-5 days per week of home exercise.  Intensity: Work with a perceived exertion of 11 (fairly light) - 15 (hard) while following your exercise prescription.  We will make changes to your prescription with you as you progress through the program.   Duration: Be able to do 30 to 45 minutes of continuous aerobic exercise in addition to a 5 minute warm-up and a 5 minute cool-down routine.   Nutrition  Goals: Your personal nutrition goals will be established when you do your nutrition analysis with the dietician.  The following are general nutrition guidelines to follow: Cholesterol < 200mg /day Sodium < 1500mg /day Fiber: Men over 50 yrs - 30 grams per day  Personal Goals:  Personal Goals and Risk Factors at Admission - 07/06/23 1153       Core Components/Risk Factors/Patient Goals on Admission    Weight Management Yes;Weight Loss    Intervention Weight Management: Develop a combined nutrition and exercise program designed to reach desired caloric intake, while maintaining appropriate intake of nutrient and fiber, sodium and fats, and appropriate energy expenditure required for the weight goal.;Weight Management: Provide education and appropriate resources to help participant work on and attain dietary goals.;Weight Management/Obesity: Establish reasonable short term and long term weight goals.    Admit Weight 183 lb 1.6 oz (83.1 kg)    Goal Weight: Long Term 168 lb 1.6 oz (76.2 kg)    Expected Outcomes Short Term: Continue to assess and modify interventions until short term weight is achieved;Weight Loss:  Understanding of general recommendations for a balanced deficit meal plan, which promotes 1-2 lb weight loss per week and includes a negative energy balance of [phone] kcal/d;Understanding recommendations for meals to include 15-35% energy as protein, 25-35% energy from fat, 35-60% energy from carbohydrates, less than 200mg  of dietary cholesterol, 20-35 gm of total fiber daily;Understanding of distribution of calorie intake throughout the day with the consumption of 4-5 meals/snacks;Long Term: Adherence to nutrition and physical activity/exercise program aimed toward attainment of established weight goal             Tobacco Use Initial Evaluation: Social History   Tobacco Use  Smoking Status Never  Smokeless Tobacco Never    Exercise Goals and Review:  Exercise Goals     Row  Name 07/06/23 1141             Exercise Goals   Increase Physical Activity Yes       Intervention Provide advice, education, support and counseling about physical activity/exercise needs.;Develop an individualized exercise prescription for aerobic and resistive training based on initial evaluation findings, risk stratification, comorbidities and participant's personal goals.       Expected Outcomes Short Term: Attend rehab on a regular basis to increase amount of physical activity.;Long Term: Add in home exercise to make exercise part of routine and to increase amount of physical activity.;Long Term: Exercising regularly at least 3-5 days a week.       Increase Strength and Stamina Yes       Intervention Provide advice, education, support and counseling about physical activity/exercise needs.;Develop an individualized exercise prescription for aerobic and resistive training based on initial evaluation findings, risk stratification, comorbidities and participant's personal goals.       Expected Outcomes Short Term: Increase workloads from initial exercise prescription for resistance, speed, and METs.;Short Term: Perform resistance training exercises routinely during rehab and add in resistance training at home;Long Term: Improve cardiorespiratory fitness, muscular endurance and strength as measured by increased METs and functional capacity ( )       Able to understand and use rate of perceived exertion (RPE) scale Yes       Intervention Provide education and explanation on how to use RPE scale       Expected Outcomes Short Term: Able to use RPE daily in rehab to express subjective intensity level;Long Term:  Able to use RPE to guide intensity level when exercising independently       Able to understand and use Dyspnea scale Yes       Intervention Provide education and explanation on how to use Dyspnea scale       Expected Outcomes Short Term: Able to use Dyspnea scale daily in rehab to express  subjective sense of shortness of breath during exertion;Long Term: Able to use Dyspnea scale to guide intensity level when exercising independently       Knowledge and understanding of Target Heart Rate Range (THRR) Yes       Intervention Provide education and explanation of THRR including how the numbers were predicted and where they are located for reference       Expected Outcomes Short Term: Able to state/look up THRR;Short Term: Able to use daily as guideline for intensity in rehab;Long Term: Able to use THRR to govern intensity when exercising independently       Able to check pulse independently Yes       Intervention Provide education and demonstration on how to check pulse in carotid and radial arteries.;Review the importance  of being able to check your own pulse for safety during independent exercise       Expected Outcomes Short Term: Able to explain why pulse checking is important during independent exercise;Long Term: Able to check pulse independently and accurately       Understanding of Exercise Prescription Yes       Intervention Provide education, explanation, and written materials on patient's individual exercise prescription       Expected Outcomes Short Term: Able to explain program exercise prescription;Long Term: Able to explain home exercise prescription to exercise independently

## 2023-07-07 ENCOUNTER — Encounter: Payer: Self-pay | Admitting: *Deleted

## 2023-07-07 DIAGNOSIS — Z955 Presence of coronary angioplasty implant and graft: Secondary | ICD-10-CM

## 2023-07-07 NOTE — Progress Notes (Signed)
 Cardiac Individual Treatment Plan  Patient Details  Name: Raymond Bennett. MRN: [id number] Date of Birth: 06-May-1945 Referring Provider:   Flowsheet Row Cardiac Rehab from 07/06/2023 in [facility] Cardiac and Pulmonary Rehab  Referring Provider Marco Severs, MD       Initial Encounter Date:  Flowsheet Row Cardiac Rehab from 07/06/2023 in [facility] Cardiac and Pulmonary Rehab  Date 07/06/23       Visit Diagnosis: Status post coronary artery stent placement  Patient's Home Medications on Admission:  Current Outpatient Medications:    acetaminophen  (TYLENOL ) 500 MG tablet, Take 500 mg by mouth. (Patient not taking: Reported on 07/04/2023), Disp: , Rfl:    Acidophilus Lactobacillus CAPS, Take 1 capsule by mouth every morning. (Patient not taking: Reported on 07/04/2023), Disp: , Rfl:    amoxicillin-clavulanate (AUGMENTIN) 875-125 MG tablet, Take 1 tablet by mouth 2 (two) times daily. (Patient not taking: Reported on 07/04/2023), Disp: , Rfl:    aspirin  EC 81 MG tablet, Take 1 tablet (81 mg total) by mouth daily. Swallow whole., Disp: 30 tablet, Rfl: 0   betamethasone dipropionate 0.05 % cream, , Disp: , Rfl:    carvedilol (COREG) 6.25 MG tablet, Take 6.25 mg by mouth. (Patient not taking: Reported on 07/04/2023), Disp: , Rfl:    cholecalciferol (VITAMIN D) 1000 units tablet, Take 1,000 Units by mouth in the morning., Disp: , Rfl:    clopidogrel  (PLAVIX ) 75 MG tablet, Take 1 tablet (75 mg total) by mouth daily., Disp: 30 tablet, Rfl: 0   doxycycline (VIBRAMYCIN) 100 MG capsule, , Disp: , Rfl:    empagliflozin (JARDIANCE) 10 MG TABS tablet, Take 10 mg by mouth in the morning. (Patient not taking: Reported on 07/04/2023), Disp: , Rfl:    empagliflozin (JARDIANCE) 10 MG TABS tablet, Take 1 tablet by mouth daily., Disp: , Rfl:    ferrous sulfate 325 (65 FE) MG tablet, Take 325 mg by mouth in the morning., Disp: , Rfl:    ferrous sulfate 325 (65 FE) MG tablet, Take 325 mg by mouth. (Patient not taking: Reported  on 07/04/2023), Disp: , Rfl:    finasteride (PROPECIA) 1 MG tablet, TAKE 1 TABLET BY MOUTH ONCE DAILY (Patient not taking: Reported on 07/04/2023), Disp: 90 tablet, Rfl: 2   finasteride (PROPECIA) 1 MG tablet, Take 1 tablet by mouth every morning., Disp: , Rfl:    HYDROcodone -acetaminophen  (NORCO/VICODIN) 5-325 MG tablet, , Disp: , Rfl:    ibuprofen  (ADVIL ) 200 MG tablet, Take 200 mg by mouth. (Patient not taking: Reported on 07/04/2023), Disp: , Rfl:    influenza vaccine (FLUCELVAX - EGG FREE) 0.5 ML injection, , Disp: , Rfl:    lisinopril (ZESTRIL) 20 MG tablet, Take 20 mg by mouth., Disp: , Rfl:    magnesium  oxide (MAG-OX) 400 MG tablet, Take 400 mg by mouth daily., Disp: , Rfl:    metoprolol tartrate (LOPRESSOR) 25 MG tablet, Take 25 mg by mouth every evening. (Patient not taking: Reported on 07/04/2023), Disp: , Rfl:    metoprolol tartrate (LOPRESSOR) 25 MG tablet, Take 1 tablet by mouth 2 (two) times daily., Disp: , Rfl:    Multiple Vitamins-Minerals (PRESERVISION AREDS 2 PO), Take 1 tablet by mouth in the morning and at bedtime., Disp: , Rfl:    nitroGLYCERIN  (NITROSTAT ) 0.4 MG SL tablet, Place 0.4 mg under the tongue every 5 (five) minutes x 3 doses as needed for chest pain., Disp: , Rfl:    omeprazole (PRILOSEC) 40 MG capsule, Take 40 mg by mouth daily  before breakfast. Reported on 08/06/2015, Disp: , Rfl:    Probiotic Product (PROBIOTIC PO), Take 1 capsule by mouth daily., Disp: , Rfl:    Red Yeast Rice 600 MG CAPS, Take 600 mg by mouth in the morning and at bedtime., Disp: , Rfl:    Red Yeast Rice Extract 600 MG TABS, Take by mouth., Disp: , Rfl:    rosuvastatin (CRESTOR) 20 MG tablet, Take 20 mg by mouth., Disp: , Rfl:    spironolactone (ALDACTONE) 25 MG tablet, Take 25 mg by mouth daily. (Patient not taking: Reported on 07/04/2023), Disp: , Rfl:    spironolactone (ALDACTONE) 25 MG tablet, Take 1 tablet by mouth daily., Disp: , Rfl:    torsemide  (DEMADEX ) 20 MG tablet, Take 40 mg by mouth.,  Disp: , Rfl:    triamcinolone  cream (KENALOG ) 0.1 %, , Disp: , Rfl:    vitamin B-12 (CYANOCOBALAMIN) 1000 MCG tablet, Take 1,000 mcg by mouth in the morning., Disp: , Rfl:   Past Medical History: Past Medical History:  Diagnosis Date   Basal cell carcinoma 01/17/2014   L lateral top of shoulder ant, L ant top of shoulder post   Basal cell carcinoma 01/01/2016   L mid back   Basal cell carcinoma 11/17/2017   R mid ear helix   Basal cell carcinoma 02/06/2019   L scalp/temple area within the hair above the sideburn/excision   Basal cell carcinoma 08/15/2019   Right chest medial infraclavicular. Superficial and nodular.    Basal cell carcinoma 08/15/2019   Left chest parasternal. Superficial.   Basal cell carcinoma 02/14/2020   L thigh above knee    Basal cell carcinoma 10/20/2022   R lat neck, EDC   Cancer (HCC)    Chronic kidney disease    renal cell carcinoma   GERD (gastroesophageal reflux disease)    History of colon polyps    History of gastric polyp    History of hiatal hernia    Neuropathy    Non Hodgkin's lymphoma (HCC)    PONV (postoperative nausea and vomiting)    "difficulty waking up after surgery"   Reflux    Renal mass     Tobacco Use: Social History   Tobacco Use  Smoking Status Never  Smokeless Tobacco Never    Labs: Review Flowsheet        No data to display           Exercise Target Goals: Exercise Program Goal: Individual exercise prescription set using results from initial 6 min walk test and THRR while considering  patient's activity barriers and safety.   Exercise Prescription Goal: Initial exercise prescription builds to 30-45 minutes a day of aerobic activity, 2-3 days per week.  Home exercise guidelines will be given to patient during program as part of exercise prescription that the participant will acknowledge.   Education: Aerobic Exercise: - Group verbal and visual presentation on the components of exercise prescription.  Introduces F.I.T.T principle from ACSM for exercise prescriptions.  Reviews F.I.T.T. principles of aerobic exercise including progression. Written material given at graduation.   Education: Resistance Exercise: - Group verbal and visual presentation on the components of exercise prescription. Introduces F.I.T.T principle from ACSM for exercise prescriptions  Reviews F.I.T.T. principles of resistance exercise including progression. Written material given at graduation.    Education: Exercise & Equipment Safety: - Individual verbal instruction and demonstration of equipment use and safety with use of the equipment. Flowsheet Row Cardiac Rehab from 07/06/2023 in [facility] Cardiac and  Pulmonary Rehab  Date 07/06/23  Educator MB  Instruction Review Code 1- Verbalizes Understanding       Education: Exercise Physiology & General Exercise Guidelines: - Group verbal and written instruction with models to review the exercise physiology of the cardiovascular system and associated critical values. Provides general exercise guidelines with specific guidelines to those with heart or lung disease.    Education: Flexibility, Balance, Mind/Body Relaxation: - Group verbal and visual presentation with interactive activity on the components of exercise prescription. Introduces F.I.T.T principle from ACSM for exercise prescriptions. Reviews F.I.T.T. principles of flexibility and balance exercise training including progression. Also discusses the mind body connection.  Reviews various relaxation techniques to help reduce and manage stress (i.e. Deep breathing, progressive muscle relaxation, and visualization). Balance handout provided to take home. Written material given at graduation.   Activity Barriers & Risk Stratification:  Activity Barriers & Cardiac Risk Stratification - 07/06/23 1137       Activity Barriers & Cardiac Risk Stratification   Activity Barriers None    Cardiac Risk Stratification Moderate              6 Minute Walk:  6 Minute Walk     Row Name 07/06/23 1135         6 Minute Walk   Phase Initial     Distance 1220 feet     Walk Time 6 minutes     # of Rest Breaks 0     MPH 2.31     METS 2.33     RPE 6     Perceived Dyspnea  0     VO2 Peak 8.16     Symptoms No     Resting HR 80 bpm     Resting BP 100/60     Resting Oxygen Saturation  100 %     Exercise Oxygen Saturation  during 6 min walk 97 %     Max Ex. HR 100 bpm     Max Ex. BP 120/62     2 Minute Post BP 118/68              Oxygen Initial Assessment:   Oxygen Re-Evaluation:   Oxygen Discharge (Final Oxygen Re-Evaluation):   Initial Exercise Prescription:  Initial Exercise Prescription - 07/06/23 1100       Date of Initial Exercise RX and Referring Provider   Date 07/06/23    Referring Provider Marco Severs, MD      Oxygen   Maintain Oxygen Saturation 88% or higher      NuStep   Level 2    SPM 80    Minutes 15    METs 2.33      Arm Ergometer   Level 1    RPM 30    Minutes 15    METs 2.33      REL-XR   Level 2    Speed 50    Minutes 15    METs 2.33      Track   Laps 33    Minutes 15    METs 2.79      Prescription Details   Frequency (times per week) 2    Duration Progress to 30 minutes of continuous aerobic without signs/symptoms of physical distress      Intensity   THRR 40-80% of Max Heartrate 104-128    Ratings of Perceived Exertion 11-13    Perceived Dyspnea 0-4      Progression   Progression Continue to progress workloads to  maintain intensity without signs/symptoms of physical distress.      Resistance Training   Training Prescription Yes    Weight 3    Reps 10-15             Perform Capillary Blood Glucose checks as needed.  Exercise Prescription Changes:   Exercise Prescription Changes     Row Name 07/06/23 1100             Response to Exercise   Blood Pressure (Admit) 100/60       Blood Pressure (Exercise) 120/62       Blood  Pressure (Exit) 118/68       Heart Rate (Admit) 80 bpm       Heart Rate (Exercise) 116 bpm       Heart Rate (Exit) 88 bpm       Oxygen Saturation (Admit) 100 %       Oxygen Saturation (Exercise) 97 %       Oxygen Saturation (Exit) 100 %       Rating of Perceived Exertion (Exercise) 6       Perceived Dyspnea (Exercise) 0       Symptoms none       Comments results       Duration Progress to 30 minutes of  aerobic without signs/symptoms of physical distress       Intensity THRR New         Progression   Progression Continue to progress workloads to maintain intensity without signs/symptoms of physical distress.       Average METs 2.33                Exercise Comments:   Exercise Goals and Review:   Exercise Goals     Row Name 07/06/23 1141             Exercise Goals   Increase Physical Activity Yes       Intervention Provide advice, education, support and counseling about physical activity/exercise needs.;Develop an individualized exercise prescription for aerobic and resistive training based on initial evaluation findings, risk stratification, comorbidities and participant's personal goals.       Expected Outcomes Short Term: Attend rehab on a regular basis to increase amount of physical activity.;Long Term: Add in home exercise to make exercise part of routine and to increase amount of physical activity.;Long Term: Exercising regularly at least 3-5 days a week.       Increase Strength and Stamina Yes       Intervention Provide advice, education, support and counseling about physical activity/exercise needs.;Develop an individualized exercise prescription for aerobic and resistive training based on initial evaluation findings, risk stratification, comorbidities and participant's personal goals.       Expected Outcomes Short Term: Increase workloads from initial exercise prescription for resistance, speed, and METs.;Short Term: Perform resistance training exercises  routinely during rehab and add in resistance training at home;Long Term: Improve cardiorespiratory fitness, muscular endurance and strength as measured by increased METs and functional capacity ( )       Able to understand and use rate of perceived exertion (RPE) scale Yes       Intervention Provide education and explanation on how to use RPE scale       Expected Outcomes Short Term: Able to use RPE daily in rehab to express subjective intensity level;Long Term:  Able to use RPE to guide intensity level when exercising independently       Able to understand and use Dyspnea  scale Yes       Intervention Provide education and explanation on how to use Dyspnea scale       Expected Outcomes Short Term: Able to use Dyspnea scale daily in rehab to express subjective sense of shortness of breath during exertion;Long Term: Able to use Dyspnea scale to guide intensity level when exercising independently       Knowledge and understanding of Target Heart Rate Range (THRR) Yes       Intervention Provide education and explanation of THRR including how the numbers were predicted and where they are located for reference       Expected Outcomes Short Term: Able to state/look up THRR;Short Term: Able to use daily as guideline for intensity in rehab;Long Term: Able to use THRR to govern intensity when exercising independently       Able to check pulse independently Yes       Intervention Provide education and demonstration on how to check pulse in carotid and radial arteries.;Review the importance of being able to check your own pulse for safety during independent exercise       Expected Outcomes Short Term: Able to explain why pulse checking is important during independent exercise;Long Term: Able to check pulse independently and accurately       Understanding of Exercise Prescription Yes       Intervention Provide education, explanation, and written materials on patient's individual exercise prescription        Expected Outcomes Short Term: Able to explain program exercise prescription;Long Term: Able to explain home exercise prescription to exercise independently                Exercise Goals Re-Evaluation :   Discharge Exercise Prescription (Final Exercise Prescription Changes):  Exercise Prescription Changes - 07/06/23 1100       Response to Exercise   Blood Pressure (Admit) 100/60    Blood Pressure (Exercise) 120/62    Blood Pressure (Exit) 118/68    Heart Rate (Admit) 80 bpm    Heart Rate (Exercise) 116 bpm    Heart Rate (Exit) 88 bpm    Oxygen Saturation (Admit) 100 %    Oxygen Saturation (Exercise) 97 %    Oxygen Saturation (Exit) 100 %    Rating of Perceived Exertion (Exercise) 6    Perceived Dyspnea (Exercise) 0    Symptoms none    Comments results    Duration Progress to 30 minutes of  aerobic without signs/symptoms of physical distress    Intensity THRR New      Progression   Progression Continue to progress workloads to maintain intensity without signs/symptoms of physical distress.    Average METs 2.33             Nutrition:  Target Goals: Understanding of nutrition guidelines, daily intake of sodium 1500mg , cholesterol 200mg , calories 30% from fat and 7% or less from saturated fats, daily to have 5 or more servings of fruits and vegetables.  Education: All About Nutrition: -Group instruction provided by verbal, written material, interactive activities, discussions, models, and posters to present general guidelines for heart healthy nutrition including fat, fiber, MyPlate, the role of sodium in heart healthy nutrition, utilization of the nutrition label, and utilization of this knowledge for meal planning. Follow up email sent as well. Written material given at graduation. Flowsheet Row Cardiac Rehab from 07/06/2023 in [facility] Cardiac and Pulmonary Rehab  Education need identified 07/06/23       Biometrics:  Pre Biometrics - 07/06/23  1142       Pre  Biometrics   Height 5' 8.11" (1.73 m)    Weight 183 lb 1.6 oz (83.1 kg)    Waist Circumference 40 inches    Hip Circumference 42 inches    Waist to Hip Ratio 0.95 %    BMI (Calculated) 27.75    Single Leg Stand 9 seconds              Nutrition Therapy Plan and Nutrition Goals:  Nutrition Therapy & Goals - 07/06/23 1151       Nutrition Therapy   RD appointment deferred Yes      Personal Nutrition Goals   Nutrition Goal RD appointment deferred at this time      Intervention Plan   Intervention Prescribe, educate and counsel regarding individualized specific dietary modifications aiming towards targeted core components such as weight, hypertension, lipid management, diabetes, heart failure and other comorbidities.    Expected Outcomes Short Term Goal: Understand basic principles of dietary content, such as calories, fat, sodium, cholesterol and nutrients.             Nutrition Assessments:  MEDIFICTS Score Key: >=70 Need to make dietary changes  40-70 Heart Healthy Diet <= 40 Therapeutic Level Cholesterol Diet  Flowsheet Row Cardiac Rehab from 07/06/2023 in [facility] Cardiac and Pulmonary Rehab  Picture Your Plate Total Score on Admission 51      Picture Your Plate Scores: <91 Unhealthy dietary pattern with much room for improvement. 41-50 Dietary pattern unlikely to meet recommendations for good health and room for improvement. 51-60 More healthful dietary pattern, with some room for improvement.  >60 Healthy dietary pattern, although there may be some specific behaviors that could be improved.    Nutrition Goals Re-Evaluation:   Nutrition Goals Discharge (Final Nutrition Goals Re-Evaluation):   Psychosocial: Target Goals: Acknowledge presence or absence of significant depression and/or stress, maximize coping skills, provide positive support system. Participant is able to verbalize types and ability to use techniques and skills needed for reducing stress and  depression.   Education: Stress, Anxiety, and Depression - Group verbal and visual presentation to define topics covered.  Reviews how body is impacted by stress, anxiety, and depression.  Also discusses healthy ways to reduce stress and to treat/manage anxiety and depression.  Written material given at graduation.   Education: Sleep Hygiene -Provides group verbal and written instruction about how sleep can affect your health.  Define sleep hygiene, discuss sleep cycles and impact of sleep habits. Review good sleep hygiene tips.    Initial Review & Psychosocial Screening:  Initial Psych Review & Screening - 07/04/23 1433       Initial Review   Current issues with None Identified      Family Dynamics   Good Support System? Yes    Comments Raymond Bennett can look to his wife and all of his family for support. He is ready to work on his conditioning.      Barriers   Psychosocial barriers to participate in program The patient should benefit from training in stress management and relaxation.;There are no identifiable barriers or psychosocial needs.      Screening Interventions   Interventions Encouraged to exercise;To provide support and resources with identified psychosocial needs;Provide feedback about the scores to participant    Expected Outcomes Long Term goal: The participant improves quality of Life and PHQ9 Scores as seen by post scores and/or verbalization of changes;Short Term goal: Identification and review with participant of any Quality  of Life or Depression concerns found by scoring the questionnaire.;Long Term Goal: Stressors or current issues are controlled or eliminated.;Short Term goal: Utilizing psychosocial counselor, staff and physician to assist with identification of specific Stressors or current issues interfering with healing process. Setting desired goal for each stressor or current issue identified.             Quality of Life Scores:   Quality of Life - 07/06/23 1143        Quality of Life   Select Quality of Life      Quality of Life Scores   Health/Function Pre 23.36 %    Socioeconomic Pre 25.36 %    Psych/Spiritual Pre 27.5 %    Family Pre 28.5 %    GLOBAL Pre 25.34 %            Scores of 19 and below usually indicate a poorer quality of life in these areas.  A difference of  2-3 points is a clinically meaningful difference.  A difference of 2-3 points in the total score of the Quality of Life Index has been associated with significant improvement in overall quality of life, self-image, physical symptoms, and general health in studies assessing change in quality of life.  PHQ-9: Review Flowsheet       07/06/2023  Depression screen PHQ 2/9  Decreased Interest 0  Down, Depressed, Hopeless 0  PHQ - 2 Score 0  Altered sleeping 0  Tired, decreased energy 3  Change in appetite 0  Feeling bad or failure about yourself  0  Trouble concentrating 0  Moving slowly or fidgety/restless 0  Suicidal thoughts 0  PHQ-9 Score 3  Difficult doing work/chores Somewhat difficult   Interpretation of Total Score  Total Score Depression Severity:  1-4 = Minimal depression, 5-9 = Mild depression, 10-14 = Moderate depression, 15-19 = Moderately severe depression, 20-27 = Severe depression   Psychosocial Evaluation and Intervention:  Psychosocial Evaluation - 07/04/23 1434       Psychosocial Evaluation & Interventions   Interventions Encouraged to exercise with the program and follow exercise prescription;Relaxation education;Stress management education    Comments Raymond Bennett can look to his wife and all of his family for support. He is ready to work on his conditioning.    Expected Outcomes Short: Start HeartTrack to help with mood. Long: Maintain a healthy mental state    Continue Psychosocial Services  Follow up required by staff             Psychosocial Re-Evaluation:   Psychosocial Discharge (Final Psychosocial Re-Evaluation):   Vocational  Rehabilitation: Provide vocational rehab assistance to qualifying candidates.   Vocational Rehab Evaluation & Intervention:   Education: Education Goals: Education classes will be provided on a variety of topics geared toward better understanding of heart health and risk factor modification. Participant will state understanding/return demonstration of topics presented as noted by education test scores.  Learning Barriers/Preferences:  Learning Barriers/Preferences - 07/04/23 1431       Learning Barriers/Preferences   Learning Barriers Sight    Learning Preferences None             General Cardiac Education Topics:  AED/CPR: - Group verbal and written instruction with the use of models to demonstrate the basic use of the AED with the basic ABC's of resuscitation.   Anatomy and Cardiac Procedures: - Group verbal and visual presentation and models provide information about basic cardiac anatomy and function. Reviews the testing methods done to diagnose heart disease  and the outcomes of the test results. Describes the treatment choices: Medical Management, Angioplasty, or Coronary Bypass Surgery for treating various heart conditions including Myocardial Infarction, Angina, Valve Disease, and Cardiac Arrhythmias.  Written material given at graduation. Flowsheet Row Cardiac Rehab from 07/06/2023 in [facility] Cardiac and Pulmonary Rehab  Education need identified 07/06/23       Medication Safety: - Group verbal and visual instruction to review commonly prescribed medications for heart and lung disease. Reviews the medication, class of the drug, and side effects. Includes the steps to properly store meds and maintain the prescription regimen.  Written material given at graduation.   Intimacy: - Group verbal instruction through game format to discuss how heart and lung disease can affect sexual intimacy. Written material given at graduation..   Know Your Numbers and Heart Failure: -  Group verbal and visual instruction to discuss disease risk factors for cardiac and pulmonary disease and treatment options.  Reviews associated critical values for Overweight/Obesity, Hypertension, Cholesterol, and Diabetes.  Discusses basics of heart failure: signs/symptoms and treatments.  Introduces Heart Failure Zone chart for action plan for heart failure.  Written material given at graduation. Flowsheet Row Cardiac Rehab from 07/06/2023 in [facility] Cardiac and Pulmonary Rehab  Education need identified 07/06/23       Infection Prevention: - Provides verbal and written material to individual with discussion of infection control including proper hand washing and proper equipment cleaning during exercise session. Flowsheet Row Cardiac Rehab from 07/06/2023 in [facility] Cardiac and Pulmonary Rehab  Date 07/06/23  Educator MB  Instruction Review Code 1- Verbalizes Understanding       Falls Prevention: - Provides verbal and written material to individual with discussion of falls prevention and safety. Flowsheet Row Cardiac Rehab from 07/06/2023 in [facility]  Date 07/06/23  Educator MB  Instruction Review Code 1- Verbalizes Understanding       Other: -Provides group and verbal instruction on various topics (see comments)   Knowledge Questionnaire Score:  Knowledge Questionnaire Score - 07/06/23 1153       Knowledge Questionnaire Score   Pre Score 22/26             Core Components/Risk Factors/Patient Goals at Admission:  Personal Goals and Risk Factors at Admission - 07/06/23 1153       Core Components/Risk Factors/Patient Goals on Admission    Weight Management Yes;Weight Loss    Intervention Weight Management: Develop a combined nutrition and exercise program designed to reach desired caloric intake, while maintaining appropriate intake of nutrient and fiber, sodium and fats, and appropriate energy expenditure required for the weight goal.;Weight  Management: Provide education and appropriate resources to help participant work on and attain dietary goals.;Weight Management/Obesity: Establish reasonable short term and long term weight goals.    Admit Weight 183 lb 1.6 oz (83.1 kg)    Goal Weight: Long Term 168 lb 1.6 oz (76.2 kg)    Expected Outcomes Short Term: Continue to assess and modify interventions until short term weight is achieved;Weight Loss: Understanding of general recommendations for a balanced deficit meal plan, which promotes 1-2 lb weight loss per week and includes a negative energy balance of [phone] kcal/d;Understanding recommendations for meals to include 15-35% energy as protein, 25-35% energy from fat, 35-60% energy from carbohydrates, less than 200mg  of dietary cholesterol, 20-35 gm of total fiber daily;Understanding of distribution of calorie intake throughout the day with the consumption of 4-5 meals/snacks;Long Term: Adherence to nutrition and physical activity/exercise program aimed  toward attainment of established weight goal             Education:Diabetes - Individual verbal and written instruction to review signs/symptoms of diabetes, desired ranges of glucose level fasting, after meals and with exercise. Acknowledge that pre and post exercise glucose checks will be done for 3 sessions at entry of program.   Core Components/Risk Factors/Patient Goals Review:    Core Components/Risk Factors/Patient Goals at Discharge (Final Review):    ITP Comments:  ITP Comments     Row Name 07/04/23 1430 07/06/23 1135 07/07/23 1119       ITP Comments Virtual Visit completed. Patient informed on EP and RD appointment and 6 Minute walk test. Patient also informed of patient health questionnaires on My Chart. Patient Verbalizes understanding. Visit diagnosis can be found in [facility] 06/22/2023. Completed and gym orientation for cardiac rehab. Initial ITP created and sent for review to Dr. Firman Hughes, Medical Director.  Raymond Bennett called and asked to be discharged.  He completed the orientation and none of the sessions              Comments: Discharge per patient request

## 2023-07-07 NOTE — Progress Notes (Signed)
 Discharge Note for  Raymond Bennett.     April 24, 1945        Patient decided to be discharged  He attended the orientation.     6 Minute Walk     Row Name 07/06/23 1135         6 Minute Walk   Phase Initial     Distance 1220 feet     Walk Time 6 minutes     # of Rest Breaks 0     MPH 2.31     METS 2.33     RPE 6     Perceived Dyspnea  0     VO2 Peak 8.16     Symptoms No     Resting HR 80 bpm     Resting BP 100/60     Resting Oxygen Saturation  100 %     Exercise Oxygen Saturation  during 6 min walk 97 %     Max Ex. HR 100 bpm     Max Ex. BP 120/62     2 Minute Post BP 118/68

## 2023-07-12 ENCOUNTER — Ambulatory Visit

## 2023-07-14 ENCOUNTER — Ambulatory Visit

## 2023-07-19 ENCOUNTER — Ambulatory Visit

## 2023-07-21 ENCOUNTER — Ambulatory Visit

## 2023-07-26 ENCOUNTER — Ambulatory Visit

## 2023-07-28 ENCOUNTER — Ambulatory Visit

## 2023-08-02 ENCOUNTER — Ambulatory Visit

## 2023-08-02 DIAGNOSIS — Z955 Presence of coronary angioplasty implant and graft: Secondary | ICD-10-CM | POA: Insufficient documentation

## 2023-08-04 ENCOUNTER — Ambulatory Visit

## 2023-08-09 ENCOUNTER — Ambulatory Visit

## 2023-08-11 ENCOUNTER — Ambulatory Visit

## 2023-08-16 ENCOUNTER — Ambulatory Visit

## 2023-08-18 ENCOUNTER — Ambulatory Visit

## 2023-08-23 ENCOUNTER — Ambulatory Visit

## 2023-08-24 DIAGNOSIS — E785 Hyperlipidemia, unspecified: Secondary | ICD-10-CM | POA: Insufficient documentation

## 2023-08-25 ENCOUNTER — Ambulatory Visit

## 2023-08-30 ENCOUNTER — Ambulatory Visit

## 2023-09-01 ENCOUNTER — Ambulatory Visit

## 2023-09-05 ENCOUNTER — Emergency Department

## 2023-09-05 ENCOUNTER — Inpatient Hospital Stay
Admission: EM | Admit: 2023-09-05 | Discharge: 2023-09-06 | DRG: 302 | Discharge status: Against Medical Advice | Attending: Internal Medicine | Admitting: Internal Medicine

## 2023-09-05 ENCOUNTER — Other Ambulatory Visit: Payer: Self-pay

## 2023-09-05 DIAGNOSIS — K219 Gastro-esophageal reflux disease without esophagitis: Secondary | ICD-10-CM | POA: Diagnosis present

## 2023-09-05 DIAGNOSIS — Z905 Acquired absence of kidney: Secondary | ICD-10-CM | POA: Diagnosis not present

## 2023-09-05 DIAGNOSIS — I130 Hypertensive heart and chronic kidney disease with heart failure and stage 1 through stage 4 chronic kidney disease, or unspecified chronic kidney disease: Secondary | ICD-10-CM | POA: Diagnosis present

## 2023-09-05 DIAGNOSIS — Z950 Presence of cardiac pacemaker: Secondary | ICD-10-CM

## 2023-09-05 DIAGNOSIS — N179 Acute kidney failure, unspecified: Secondary | ICD-10-CM | POA: Diagnosis present

## 2023-09-05 DIAGNOSIS — D649 Anemia, unspecified: Secondary | ICD-10-CM | POA: Diagnosis present

## 2023-09-05 DIAGNOSIS — Z8572 Personal history of non-Hodgkin lymphomas: Secondary | ICD-10-CM

## 2023-09-05 DIAGNOSIS — Z85528 Personal history of other malignant neoplasm of kidney: Secondary | ICD-10-CM | POA: Diagnosis not present

## 2023-09-05 DIAGNOSIS — Z860100 Personal history of colon polyps, unspecified: Secondary | ICD-10-CM

## 2023-09-05 DIAGNOSIS — D631 Anemia in chronic kidney disease: Secondary | ICD-10-CM

## 2023-09-05 DIAGNOSIS — Z85828 Personal history of other malignant neoplasm of skin: Secondary | ICD-10-CM

## 2023-09-05 DIAGNOSIS — Z5329 Procedure and treatment not carried out because of patient's decision for other reasons: Secondary | ICD-10-CM | POA: Diagnosis not present

## 2023-09-05 DIAGNOSIS — E785 Hyperlipidemia, unspecified: Secondary | ICD-10-CM | POA: Diagnosis present

## 2023-09-05 DIAGNOSIS — Z91014 Allergy to mammalian meats: Secondary | ICD-10-CM

## 2023-09-05 DIAGNOSIS — N1832 Chronic kidney disease, stage 3b: Secondary | ICD-10-CM | POA: Diagnosis present

## 2023-09-05 DIAGNOSIS — R0789 Other chest pain: Secondary | ICD-10-CM | POA: Diagnosis present

## 2023-09-05 DIAGNOSIS — Z955 Presence of coronary angioplasty implant and graft: Secondary | ICD-10-CM

## 2023-09-05 DIAGNOSIS — Z86018 Personal history of other benign neoplasm: Secondary | ICD-10-CM

## 2023-09-05 DIAGNOSIS — I10 Essential (primary) hypertension: Secondary | ICD-10-CM | POA: Diagnosis not present

## 2023-09-05 DIAGNOSIS — E875 Hyperkalemia: Secondary | ICD-10-CM | POA: Diagnosis present

## 2023-09-05 DIAGNOSIS — I4510 Unspecified right bundle-branch block: Secondary | ICD-10-CM | POA: Diagnosis present

## 2023-09-05 DIAGNOSIS — I5021 Acute systolic (congestive) heart failure: Secondary | ICD-10-CM | POA: Diagnosis present

## 2023-09-05 DIAGNOSIS — Z7984 Long term (current) use of oral hypoglycemic drugs: Secondary | ICD-10-CM

## 2023-09-05 DIAGNOSIS — Z79899 Other long term (current) drug therapy: Secondary | ICD-10-CM | POA: Diagnosis not present

## 2023-09-05 DIAGNOSIS — Z7982 Long term (current) use of aspirin: Secondary | ICD-10-CM

## 2023-09-05 DIAGNOSIS — I25110 Atherosclerotic heart disease of native coronary artery with unstable angina pectoris: Principal | ICD-10-CM | POA: Diagnosis present

## 2023-09-05 DIAGNOSIS — I200 Unstable angina: Principal | ICD-10-CM | POA: Diagnosis present

## 2023-09-05 DIAGNOSIS — I440 Atrioventricular block, first degree: Secondary | ICD-10-CM | POA: Diagnosis present

## 2023-09-05 DIAGNOSIS — Z7902 Long term (current) use of antithrombotics/antiplatelets: Secondary | ICD-10-CM | POA: Diagnosis not present

## 2023-09-05 DIAGNOSIS — I429 Cardiomyopathy, unspecified: Secondary | ICD-10-CM | POA: Diagnosis present

## 2023-09-05 DIAGNOSIS — I2510 Atherosclerotic heart disease of native coronary artery without angina pectoris: Secondary | ICD-10-CM

## 2023-09-05 DIAGNOSIS — E871 Hypo-osmolality and hyponatremia: Secondary | ICD-10-CM | POA: Diagnosis present

## 2023-09-05 HISTORY — DX: Unstable angina: I20.0

## 2023-09-05 HISTORY — DX: Atherosclerotic heart disease of native coronary artery without angina pectoris: I25.10

## 2023-09-05 LAB — BASIC METABOLIC PANEL WITH GFR
Anion gap: 10 (ref 5–15)
BUN: 38 mg/dL — ABNORMAL HIGH (ref 8–23)
CO2: 20 mmol/L — ABNORMAL LOW (ref 22–32)
Calcium: 9.2 mg/dL (ref 8.9–10.3)
Chloride: 103 mmol/L (ref 98–111)
Creatinine, Ser: 1.92 mg/dL — ABNORMAL HIGH (ref 0.61–1.24)
GFR, Estimated: 35 mL/min — ABNORMAL LOW (ref >60)
Glucose, Bld: 125 mg/dL — ABNORMAL HIGH (ref 70–99)
Potassium: 4.7 mmol/L (ref 3.5–5.1)
Sodium: 133 mmol/L — ABNORMAL LOW (ref 135–145)

## 2023-09-05 LAB — LACTIC ACID, PLASMA
Lactic Acid, Venous: 1.1 mmol/L (ref 0.5–1.9)
Lactic Acid, Venous: 1 mmol/L (ref 0.5–1.9)

## 2023-09-05 LAB — CREATININE, SERUM
Creatinine, Ser: 2.04 mg/dL — ABNORMAL HIGH (ref 0.61–1.24)
GFR, Estimated: 33 mL/min — ABNORMAL LOW (ref >60)

## 2023-09-05 LAB — CBC
HCT: 29.2 % — ABNORMAL LOW (ref 39.0–52.0)
HCT: 29.3 % — ABNORMAL LOW (ref 39.0–52.0)
Hemoglobin: 9.7 g/dL — ABNORMAL LOW (ref 13.0–17.0)
Hemoglobin: 9.8 g/dL — ABNORMAL LOW (ref 13.0–17.0)
MCH: 32.8 pg (ref 26.0–34.0)
MCH: 32.8 pg (ref 26.0–34.0)
MCHC: 33.2 g/dL (ref 30.0–36.0)
MCHC: 33.4 g/dL (ref 30.0–36.0)
MCV: 98.6 fL (ref 80.0–100.0)
MCV: 98 fL (ref 80.0–100.0)
Platelets: 177 10*3/uL (ref 150–400)
Platelets: 167 10*3/uL (ref 150–400)
RBC: 2.96 MIL/uL — ABNORMAL LOW (ref 4.22–5.81)
RBC: 2.99 MIL/uL — ABNORMAL LOW (ref 4.22–5.81)
RDW: 13.8 % (ref 11.5–15.5)
RDW: 13.8 % (ref 11.5–15.5)
WBC: 8.6 10*3/uL (ref 4.0–10.5)
WBC: 9.2 10*3/uL (ref 4.0–10.5)
nRBC: 0 % (ref 0.0–0.2)
nRBC: 0 % (ref 0.0–0.2)

## 2023-09-05 LAB — IRON AND TIBC
Iron: 92 ug/dL (ref 45–182)
Saturation Ratios: 37 % (ref 17.9–39.5)
TIBC: 251 ug/dL (ref 250–450)
UIBC: 159 ug/dL

## 2023-09-05 LAB — PROTIME-INR
INR: 1 (ref 0.8–1.2)
Prothrombin Time: 14.3 s (ref 11.4–15.2)

## 2023-09-05 LAB — LIPID PANEL
Cholesterol: 90 mg/dL (ref 0–200)
HDL: 25 mg/dL — ABNORMAL LOW (ref >40)
LDL Cholesterol: 31 mg/dL (ref 0–99)
Total CHOL/HDL Ratio: 3.6 ratio
Triglycerides: 170 mg/dL — ABNORMAL HIGH (ref <150)
VLDL: 34 mg/dL (ref 0–40)

## 2023-09-05 LAB — FOLATE: Folate: 24 ng/mL (ref >5.9)

## 2023-09-05 LAB — TROPONIN I (HIGH SENSITIVITY)
Troponin I (High Sensitivity): 20 ng/L — ABNORMAL HIGH (ref <18)
Troponin I (High Sensitivity): 22 ng/L — ABNORMAL HIGH (ref <18)

## 2023-09-05 LAB — APTT: aPTT: 31 s (ref 24–36)

## 2023-09-05 LAB — RETICULOCYTES
Immature Retic Fract: 12 % (ref 2.3–15.9)
RBC.: 3.01 MIL/uL — ABNORMAL LOW (ref 4.22–5.81)
Retic Count, Absolute: 47.9 10*3/uL (ref 19.0–186.0)
Retic Ct Pct: 1.6 % (ref 0.4–3.1)

## 2023-09-05 LAB — FERRITIN: Ferritin: 199 ng/mL (ref 24–336)

## 2023-09-05 LAB — BRAIN NATRIURETIC PEPTIDE: B Natriuretic Peptide: 198.1 pg/mL — ABNORMAL HIGH (ref 0.0–100.0)

## 2023-09-05 MED ORDER — METOPROLOL TARTRATE 25 MG PO TABS
25.0000 mg | ORAL_TABLET | Freq: Two times a day (BID) | ORAL | Status: DC
  Administered 2023-09-05 – 2023-09-06 (×2): 25 mg via ORAL
  Filled 2023-09-05 (×2): qty 1

## 2023-09-05 MED ORDER — ACETAMINOPHEN 650 MG RE SUPP: 650.0000 mg | Freq: Four times a day (QID) | RECTAL | Status: DC | PRN

## 2023-09-05 MED ORDER — HEPARIN BOLUS VIA INFUSION
4000.0000 [IU] | Freq: Once | INTRAVENOUS | Status: AC
  Administered 2023-09-05: 4000 [IU] via INTRAVENOUS
  Filled 2023-09-05: qty 4000

## 2023-09-05 MED ORDER — POLYETHYLENE GLYCOL 3350 17 G PO PACK: 17.0000 g | PACK | Freq: Every day | ORAL | Status: DC | PRN

## 2023-09-05 MED ORDER — CLOPIDOGREL BISULFATE 75 MG PO TABS
75.0000 mg | ORAL_TABLET | Freq: Every day | ORAL | Status: DC
  Administered 2023-09-06: 75 mg via ORAL
  Filled 2023-09-05: qty 1

## 2023-09-05 MED ORDER — CYANOCOBALAMIN 500 MCG PO TABS
1000.0000 ug | ORAL_TABLET | Freq: Every morning | ORAL | Status: DC
  Administered 2023-09-06: 1000 ug via ORAL
  Filled 2023-09-05: qty 2

## 2023-09-05 MED ORDER — FERROUS SULFATE 325 (65 FE) MG PO TABS
325.0000 mg | ORAL_TABLET | Freq: Every morning | ORAL | Status: DC
  Administered 2023-09-06: 325 mg via ORAL
  Filled 2023-09-05: qty 1

## 2023-09-05 MED ORDER — LACTATED RINGERS IV SOLN: INTRAVENOUS | Status: DC

## 2023-09-05 MED ORDER — PANTOPRAZOLE SODIUM 40 MG PO TBEC
40.0000 mg | DELAYED_RELEASE_TABLET | Freq: Every day | ORAL | Status: DC
  Administered 2023-09-05 – 2023-09-06 (×2): 40 mg via ORAL
  Filled 2023-09-05 (×2): qty 1

## 2023-09-05 MED ORDER — NITROGLYCERIN 0.4 MG SL SUBL
0.4000 mg | SUBLINGUAL_TABLET | Freq: Once | SUBLINGUAL | Status: DC
  Filled 2023-09-05: qty 1

## 2023-09-05 MED ORDER — ASPIRIN 81 MG PO CHEW
162.0000 mg | CHEWABLE_TABLET | Freq: Once | ORAL | Status: AC
  Administered 2023-09-05: 162 mg via ORAL
  Filled 2023-09-05: qty 2

## 2023-09-05 MED ORDER — EMPAGLIFLOZIN 10 MG PO TABS
10.0000 mg | ORAL_TABLET | Freq: Every day | ORAL | Status: DC
  Filled 2023-09-05: qty 1

## 2023-09-05 MED ORDER — ASPIRIN 81 MG PO TBEC
81.0000 mg | DELAYED_RELEASE_TABLET | Freq: Every day | ORAL | Status: DC
  Administered 2023-09-06: 81 mg via ORAL
  Filled 2023-09-05: qty 1

## 2023-09-05 MED ORDER — HEPARIN (PORCINE) 25000 UT/250ML-% IV SOLN
1150.0000 [IU]/h | INTRAVENOUS | Status: DC
  Administered 2023-09-05: 950 [IU]/h via INTRAVENOUS
  Filled 2023-09-05: qty 250

## 2023-09-05 MED ORDER — HYDRALAZINE HCL 50 MG PO TABS: 25.0000 mg | ORAL_TABLET | Freq: Three times a day (TID) | ORAL | Status: DC | PRN

## 2023-09-05 MED ORDER — ACETAMINOPHEN 325 MG PO TABS: 650.0000 mg | ORAL_TABLET | Freq: Four times a day (QID) | ORAL | Status: DC | PRN

## 2023-09-05 MED ORDER — ONDANSETRON HCL 4 MG/2ML IJ SOLN: 4.0000 mg | Freq: Four times a day (QID) | INTRAMUSCULAR | Status: DC | PRN

## 2023-09-05 MED ORDER — MAGNESIUM OXIDE 400 MG PO TABS
400.0000 mg | ORAL_TABLET | Freq: Every day | ORAL | Status: DC
  Administered 2023-09-06: 400 mg via ORAL
  Filled 2023-09-05 (×2): qty 1

## 2023-09-05 MED ORDER — VITAMIN D 25 MCG (1000 UNIT) PO TABS
1000.0000 [IU] | ORAL_TABLET | Freq: Every morning | ORAL | Status: DC
  Administered 2023-09-06: 1000 [IU] via ORAL
  Filled 2023-09-05: qty 1

## 2023-09-05 MED ORDER — ROSUVASTATIN CALCIUM 20 MG PO TABS
20.0000 mg | ORAL_TABLET | Freq: Every day | ORAL | Status: DC
  Administered 2023-09-06: 20 mg via ORAL
  Filled 2023-09-05 (×2): qty 1

## 2023-09-05 MED ORDER — FINASTERIDE 1 MG PO TABS: 1.0000 mg | ORAL_TABLET | Freq: Every morning | ORAL | Status: DC

## 2023-09-05 MED ORDER — SODIUM CHLORIDE 0.9% FLUSH
3.0000 mL | Freq: Two times a day (BID) | INTRAVENOUS | Status: DC
  Administered 2023-09-05 – 2023-09-06 (×2): 3 mL via INTRAVENOUS

## 2023-09-05 MED ORDER — NITROGLYCERIN 2 % TD OINT
0.5000 [in_us] | TOPICAL_OINTMENT | Freq: Four times a day (QID) | TRANSDERMAL | Status: DC
  Administered 2023-09-05 – 2023-09-06 (×4): 0.5 [in_us] via TOPICAL
  Filled 2023-09-05 (×4): qty 1

## 2023-09-05 MED ORDER — ONDANSETRON HCL 4 MG PO TABS: 4.0000 mg | ORAL_TABLET | Freq: Four times a day (QID) | ORAL | Status: DC | PRN

## 2023-09-05 NOTE — ED Notes (Signed)
 Called [facility] to add pt to their monitoring.

## 2023-09-05 NOTE — ED Provider Notes (Signed)
 [facility] Provider Note    Event Date/Time   First MD Initiated Contact with Patient 09/05/23 1504     (approximate)   History   Chief Complaint: Chest Pain and Hypotension   HPI  Raymond Bennett. is a 78 y.o. male with a history of GERD, CAD, recent coronary artery stent in April 2025 on aspirin  and Plavix  who comes ED complaining of chest pain.  Chest pain has been going for the past 3 weeks.  He saw cardiology 2 weeks ago, was prescribed Imdur along with taking sublingual nitroglycerin  as needed.  Initially, chest pain was relieved by nitroglycerin , only seem to occur in the morning when he got out of bed to walk to the bathroom.  Over the last 2 weeks it is gradually become worse and worse.  Over the last 2 days it is much worse, it is happening all times of day with any degree of walking.  Feels like burning, starts in the left side of the chest and radiates to the right.  No dizziness or syncope.  Contacted [facility] cardiology who sent him to the ED.  Cardiology team notes that they will see him in the hospital and plans for repeat cardiac catheterization in 2 days.  Patient reports he took 10 sublingual nitros yesterday due to pain     Past Medical History:  Diagnosis Date   Basal cell carcinoma 01/17/2014   L lateral top of shoulder ant, L ant top of shoulder post   Basal cell carcinoma 01/01/2016   L mid back   Basal cell carcinoma 11/17/2017   R mid ear helix   Basal cell carcinoma 02/06/2019   L scalp/temple area within the hair above the sideburn/excision   Basal cell carcinoma 08/15/2019   Right chest medial infraclavicular. Superficial and nodular.    Basal cell carcinoma 08/15/2019   Left chest parasternal. Superficial.   Basal cell carcinoma 02/14/2020   L thigh above knee    Basal cell carcinoma 10/20/2022   R lat neck, EDC   Cancer (HCC)    Chronic kidney disease    renal cell carcinoma   Coronary artery disease    GERD  (gastroesophageal reflux disease)    History of colon polyps    History of gastric polyp    History of hiatal hernia    Neuropathy    Non Hodgkin's lymphoma (HCC)    PONV (postoperative nausea and vomiting)    difficulty waking up after surgery   Reflux    Renal mass     Current Outpatient Rx   Order #: [id number] Class: Historical Med   Order #: [id number] Class: Historical Med   Order #: [id number] Class: Historical Med   Order #: [id number] Class: Normal   Order #: [id number] Class: Historical Med   Order #: [id number] Class: Historical Med   Order #: [id number] Class: Historical Med   Order #: [id number] Class: Normal   Order #: [id number] Class: Historical Med   Order #: [id number] Class: Historical Med   Order #: [id number] Class: Historical Med   Order #: [id number] Class: Historical Med   Order #: [id number] Class: Historical Med   Order #: [id number] Class: Normal   Order #: [id number] Class: Historical Med   Order #: [id number] Class: Historical Med   Order #: [id number] Class: Historical Med   Order #: [id number] Class: Historical Med   Order #: [id number] Class: Historical Med   Order #: [id number] Class: Historical Med   Order #: [id number] Class: Historical Med   Order #: [id number] Class: Historical Med   Order #:  [id number] Class: Historical Med   Order #: [id number] Class: Historical Med   Order #: [id number] Class: Historical Med   Order #: [id number] Class: Historical Med   Order #: [id number] Class: Historical Med   Order #: [id number] Class: Historical Med   Order #: [id number] Class: Historical Med   Order #: [id number] Class: Historical Med   Order #: [id number] Class: Historical Med   Order #: [id number] Class: Historical Med   Order #: [id number] Class: Historical Med   Order #: [id number] Class: Historical Med    Past Surgical History:  Procedure Laterality Date   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   CHOLECYSTECTOMY     COLONOSCOPY WITH PROPOFOL  N/A 02/11/2017    Procedure: COLONOSCOPY WITH PROPOFOL ;  Surgeon: Viktoria Lamar DASEN, MD;  Location: [facility] ENDOSCOPY;  Service: Endoscopy;  Laterality: N/A;   CORONARY STENT INTERVENTION N/A 06/22/2023   Procedure: CORONARY STENT INTERVENTION;  Surgeon: Katina Albright, MD;  Location: [facility] INVASIVE CV LAB;  Service: Cardiovascular;  Laterality: N/A;   CORONARY ULTRASOUND/IVUS N/A 06/22/2023   Procedure: Coronary Ultrasound/IVUS;  Surgeon: Katina Albright, MD;  Location: [facility] INVASIVE CV LAB;  Service: Cardiovascular;  Laterality: N/A;   ESOPHAGOGASTRODUODENOSCOPY (EGD) WITH PROPOFOL  N/A 02/11/2017   Procedure: ESOPHAGOGASTRODUODENOSCOPY (EGD) WITH PROPOFOL ;  Surgeon: Viktoria Lamar DASEN, MD;  Location: [facility] ENDOSCOPY;  Service: Endoscopy;  Laterality: N/A;   FRACTURE SURGERY     GALLBLADDER SURGERY     HERNIA REPAIR Left    Inguinal Hernia Repair   LEFT HEART CATH AND CORONARY ANGIOGRAPHY Left 06/22/2023   Procedure: LEFT HEART CATH AND CORONARY ANGIOGRAPHY;  Surgeon: Katina Albright, MD;  Location: [facility] INVASIVE CV LAB;  Service: Cardiovascular;  Laterality: Left;   PACEMAKER LEADLESS INSERTION N/A 11/02/2022   Procedure: PACEMAKER LEADLESS INSERTION;  Surgeon: Ammon Blunt, MD;  Location: [facility] INVASIVE CV LAB;  Service: Cardiovascular;  Laterality: N/A;   ROBOTIC ASSITED PARTIAL NEPHRECTOMY Left 11/12/2015   Procedure: ROBOTIC ASSITED PARTIAL NEPHRECTOMY;  Surgeon: Redell Lynwood Napoleon, MD;  Location: [facility] ORS;  Service: Urology;  Laterality: Left;   TONSILLECTOMY      Physical Exam   Triage Vital Signs: ED Triage Vitals  Encounter Vitals Group     BP 09/05/23 1456 (!) 99/53     Girls Systolic BP Percentile --      Girls Diastolic BP Percentile --      Boys Systolic BP Percentile --      Boys Diastolic BP Percentile --      Pulse Rate 09/05/23 1456 75     Resp 09/05/23 1456 20     Temp 09/05/23 1456 98.2 F (36.8 C)     Temp Source 09/05/23 1456 Oral     SpO2 09/05/23 1456 98 %     Weight 09/05/23 1455 179  lb (81.2 kg)     Height 09/05/23 1455 5' 8 (1.727 m)     Head Circumference --      Peak Flow --      Pain Score 09/05/23 1457 0     Pain Loc --      Pain Education --      Exclude from Growth Chart --     Most recent vital signs: Vitals:   09/05/23 1530 09/05/23 1600  BP: 104/65 110/61  Pulse: 78 72  Resp: 16 15  Temp:    SpO2: 97% 97%    General: Awake, no distress.  CV:  Good peripheral perfusion.  Regular rate and rhythm.  Normal distal pulses Resp:  Normal effort.  Clear lungs Abd:  No distention.  Soft nontender Other:     ED Results / Procedures / Treatments   Labs (all labs ordered are listed, but only abnormal results are displayed) Labs Reviewed  BASIC METABOLIC PANEL WITH GFR - Abnormal; Notable for the following components:      Result Value   Sodium 133 (*)    CO2 20 (*)    Glucose, Bld 125 (*)    BUN 38 (*)    Creatinine, Ser 1.92 (*)    GFR, Estimated 35 (*)    All other components within normal limits  TROPONIN I (HIGH SENSITIVITY) - Abnormal; Notable for the following components:   Troponin I (High Sensitivity) 20 (*)    All other components within normal limits  PROTIME-INR  APTT  CBC  LIPID PANEL  LACTIC ACID, PLASMA  LACTIC ACID, PLASMA  HEPARIN  LEVEL (UNFRACTIONATED)     EKG Interpreted by me Sinus rhythm, rate of 82.  Left axis, right bundle branch block.  First-degree AV block.  No acute ischemic changes.   RADIOLOGY Chest x-ray interpreted by me, appears unremarkable.  Radiology report reviewed   PROCEDURES:  .Critical Care  Performed by: Viviann Pastor, MD Authorized by: Viviann Pastor, MD   Critical care provider statement:    Critical care time (minutes):  35   Critical care time was exclusive of:  Separately billable procedures and treating other patients   Critical care was necessary to treat or prevent imminent or life-threatening deterioration of the following conditions:  Cardiac failure and circulatory  failure   Critical care was time spent personally by me on the following activities:  Development of treatment plan with patient or surrogate, discussions with consultants, evaluation of patient's response to treatment, examination of patient, obtaining history from patient or surrogate, ordering and performing treatments and interventions, ordering and review of laboratory studies, ordering and review of radiographic studies, pulse oximetry, re-evaluation of patient's condition and review of old charts   Care discussed with: admitting provider      MEDICATIONS ORDERED IN ED: Medications  aspirin  chewable tablet 162 mg (has no administration in time range)  heparin  bolus via infusion 4,000 Units (has no administration in time range)  heparin  ADULT infusion 100 units/mL (25000 units/250mL) (has no administration in time range)     IMPRESSION / MDM / ASSESSMENT AND PLAN / ED COURSE  I reviewed the triage vital signs and the nursing notes.  DDx: Unstable angina, acute heart failure, nitroglycerin  induced hypotension, AKI, electrolyte derangement  Patient's presentation is most consistent with acute presentation with potential threat to life or bodily function.  Patient presents with worsening exertional chest pain consistent with unstable angina.  I think his hypotension on initial triage is due to frequent nitroglycerin  use.  Will withhold nitrates for now, give additional aspirin , start heparin .  Discussed with cardiology team who will plan for heart cath in 2 days.       FINAL CLINICAL IMPRESSION(S) / ED DIAGNOSES   Final diagnoses:  Unstable angina (HCC)     Rx / DC Orders   ED Discharge Orders     None        Note:  This document was prepared using Dragon voice recognition software and may include unintentional dictation errors.   Viviann Pastor, MD 09/05/23 [phone]

## 2023-09-05 NOTE — ED Notes (Signed)
 Pt to xray

## 2023-09-05 NOTE — Assessment & Plan Note (Signed)
 Continue with PPI

## 2023-09-05 NOTE — H&P (Signed)
 History and Physical    Patient: Raymond Bennett. FMW:[id number] DOB: [birth year] DOA: 09/05/2023 DOS: the patient was seen and examined on 09/05/2023 PCP: Corlis Honor BROCKS, MD  Patient coming from: Home  Chief Complaint:  Chief Complaint  Patient presents with   Chest Pain   Hypotension   HPI: Raymond Bennett. is a 78 y.o. male with medical history significant of CAD s/p PCI in April 2025 and GERD came to ED with progressively worsening chest pain which normally occurs whenever he started getting out of bed and walking.  Going on for the past couple of weeks slowly worsening to the point that he took 10 nitroglycerin  yesterday.  Pain seems like burning sensation across the chest.  Nitroglycerin  helped with the symptoms.  Patient has some associated shortness of breath.  Patient called his cardiologist today and he was advised to come to ED.  Denies any other recent illnesses, no orthopnea, no urinary symptoms.  ED course and data reviewed.  Vital stable.  Labs with mild hyponatremia at 133 and AKI with creatinine of 1.92, baseline seems to be at 1.5.  Troponin 20, hemoglobin 9.7 it was 13.8 in April 2025.  CXR with no active cardiopulmonary disease. EKG-personally reviewed-NSR with first-degree heart block and RBBB.  Cardiology is on board and they are planning cardiac catheterization on Wednesday.  He was started on heparin  infusion.  Review of Systems: As mentioned in the history of present illness. All other systems reviewed and are negative. Past Medical History:  Diagnosis Date   Basal cell carcinoma 01/17/2014   L lateral top of shoulder ant, L ant top of shoulder post   Basal cell carcinoma 01/01/2016   L mid back   Basal cell carcinoma 11/17/2017   R mid ear helix   Basal cell carcinoma 02/06/2019   L scalp/temple area within the hair above the sideburn/excision   Basal cell carcinoma 08/15/2019   Right chest medial infraclavicular. Superficial and nodular.    Basal cell  carcinoma 08/15/2019   Left chest parasternal. Superficial.   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   CHOLECYSTECTOMY     COLONOSCOPY WITH PROPOFOL  N/A 02/11/2017   Procedure: COLONOSCOPY WITH PROPOFOL ;  Surgeon: Viktoria Lamar DASEN, MD;  Location: [facility] ENDOSCOPY;  Service: Endoscopy;  Laterality: N/A;   CORONARY STENT INTERVENTION N/A 06/22/2023   Procedure: CORONARY STENT INTERVENTION;  Surgeon: Katina Albright, MD;  Location: [facility] INVASIVE CV LAB;  Service: Cardiovascular;  Laterality: N/A;   CORONARY ULTRASOUND/IVUS N/A 06/22/2023   Procedure: Coronary Ultrasound/IVUS;  Surgeon: Katina Albright, MD;  Location: [facility] INVASIVE CV LAB;  Service: Cardiovascular;  Laterality: N/A;   ESOPHAGOGASTRODUODENOSCOPY (EGD) WITH PROPOFOL  N/A 02/11/2017   Procedure: ESOPHAGOGASTRODUODENOSCOPY (EGD) WITH PROPOFOL ;  Surgeon: Viktoria Lamar  T, MD;  Location: [facility] ENDOSCOPY;  Service: Endoscopy;  Laterality: N/A;   FRACTURE SURGERY     GALLBLADDER SURGERY     HERNIA REPAIR Left    Inguinal Hernia Repair   LEFT HEART CATH AND CORONARY ANGIOGRAPHY Left 06/22/2023   Procedure: LEFT HEART CATH AND CORONARY ANGIOGRAPHY;  Surgeon: Katina Albright, MD;  Location: [facility] INVASIVE CV LAB;  Service: Cardiovascular;  Laterality: Left;   PACEMAKER LEADLESS INSERTION N/A 11/02/2022   Procedure: PACEMAKER LEADLESS INSERTION;  Surgeon: Ammon Blunt, MD;  Location:  [facility] INVASIVE CV LAB;  Service: Cardiovascular;  Laterality: N/A;   ROBOTIC ASSITED PARTIAL NEPHRECTOMY Left 11/12/2015   Procedure: ROBOTIC ASSITED PARTIAL NEPHRECTOMY;  Surgeon: Redell Lynwood Napoleon, MD;  Location: [facility] ORS;  Service: Urology;  Laterality: Left;   TONSILLECTOMY     Social History:  reports that he has never smoked. He has never used smokeless tobacco. He reports that he does not drink alcohol and does not use drugs.  Allergies  Allergen Reactions   Beef-Derived Drug Products Nausea And Vomiting    History reviewed. No pertinent family history.  Prior to Admission medications   Medication Sig Start Date End Date Taking? Authorizing Provider  acetaminophen  (TYLENOL ) 500 MG tablet Take 500 mg by mouth. Patient not taking: Reported on 07/04/2023    [provider]  Acidophilus Lactobacillus CAPS Take 1 capsule by mouth every morning. Patient not taking: Reported on 07/04/2023    [provider]  amoxicillin-clavulanate (AUGMENTIN) 875-125 MG tablet Take 1 tablet by mouth 2 (two) times daily. Patient not taking: Reported on 07/04/2023 05/12/23   [provider]  aspirin  EC 81 MG tablet Take 1 tablet (81 mg total) by mouth daily. Swallow whole. 06/22/23   Hudson, Caralyn, PA-C  betamethasone dipropionate 0.05 % cream     [provider]  carvedilol (COREG) 6.25 MG tablet Take 6.25 mg by mouth. Patient not taking: Reported on 07/04/2023 07/05/19   [provider]  cholecalciferol (VITAMIN D) 1000 units tablet Take 1,000 Units by mouth in the morning.    [provider]  clopidogrel  (PLAVIX ) 75 MG tablet Take 1 tablet (75 mg total) by mouth daily. 06/22/23   Hudson, Caralyn, PA-C  doxycycline (VIBRAMYCIN) 100 MG capsule     [provider]  empagliflozin (JARDIANCE) 10 MG TABS tablet Take 10 mg by mouth in the morning. Patient not taking: Reported on 07/04/2023    [provider]  empagliflozin (JARDIANCE) 10 MG  TABS tablet Take 1 tablet by mouth daily. 01/04/23 01/04/24  [provider]  ferrous sulfate 325 (65 FE) MG tablet Take 325 mg by mouth in the morning.    [provider]  ferrous sulfate 325 (65 FE) MG tablet Take 325 mg by mouth. Patient not taking: Reported on 07/04/2023    [provider]  finasteride (PROPECIA) 1 MG tablet TAKE 1 TABLET BY MOUTH ONCE DAILY Patient not taking: Reported on 07/04/2023 03/16/23   Hester Alm BROCKS, MD  finasteride (PROPECIA) 1 MG tablet Take 1 tablet by mouth every morning. 03/15/18   [provider]  HYDROcodone -acetaminophen  (NORCO/VICODIN) 5-325 MG tablet     [provider]  ibuprofen  (ADVIL ) 200 MG tablet Take 200 mg by mouth. Patient not taking: Reported on 07/04/2023    [provider]  influenza vaccine (FLUCELVAX - EGG FREE) 0.5 ML injection     [provider]  lisinopril (ZESTRIL) 20 MG tablet Take 20 mg by mouth. 06/29/23 06/28/24  [provider]  magnesium  oxide (MAG-OX) 400 MG tablet Take 400 mg by mouth daily.    [provider]  metoprolol tartrate (LOPRESSOR) 25 MG tablet Take 25 mg by mouth every evening. Patient not taking: Reported on 07/04/2023    [provider]  metoprolol tartrate (LOPRESSOR) 25 MG tablet Take 1 tablet by mouth 2 (two) times daily. 12/28/22 12/28/23  [provider]  Multiple Vitamins-Minerals (PRESERVISION AREDS 2 PO) Take 1 tablet by mouth in the morning and at bedtime.    [provider]  nitroGLYCERIN  (NITROSTAT ) 0.4 MG SL tablet Place 0.4 mg under the tongue every 5 (five) minutes x 3 doses as needed for chest pain. 06/14/23 06/13/24  [provider]  omeprazole (PRILOSEC) 40 MG capsule Take 40 mg by mouth daily before breakfast. Reported on 08/06/2015 10/29/13   [provider]  Probiotic Product (PROBIOTIC PO) Take 1 capsule by mouth daily.    [provider]  Red Yeast Rice 600 MG CAPS Take 600  mg by mouth in the morning and at bedtime.    [provider]  Red Yeast Rice Extract 600 MG TABS Take by mouth.    [provider]  rosuvastatin (CRESTOR) 20 MG tablet Take 20 mg by mouth. 06/29/23 06/28/24  [provider]  spironolactone (ALDACTONE) 25 MG tablet Take 25 mg by mouth daily. Patient not taking: Reported on 07/04/2023    [provider]  spironolactone (ALDACTONE) 25 MG tablet Take 1 tablet by mouth daily. 01/04/23 01/04/24  [provider]  torsemide  (DEMADEX ) 20 MG tablet Take 40 mg by mouth. 11/24/22 11/24/23  [provider]  triamcinolone  cream (KENALOG ) 0.1 %     [provider]  vitamin B-12 (CYANOCOBALAMIN) 1000 MCG tablet Take 1,000 mcg by mouth in the morning.    [provider]    Physical Exam: Vitals:   09/05/23 1455 09/05/23 1456 09/05/23 1530 09/05/23 1600  BP:  (!) 99/53 104/65 110/61  Pulse:  75 78 72  Resp:  20 16 15   Temp:  98.2 F (36.8 C)    TempSrc:  Oral    SpO2:  98% 97% 97%  Weight: 81.2 kg     Height: 5' 8 (1.727 m)       General: Vital signs reviewed.  Patient is well-developed and well-nourished, in no acute distress and cooperative with exam.  Head: Normocephalic and atraumatic. Eyes: EOMI, conjunctivae normal, no scleral icterus.  Neck: Supple, trachea midline, normal ROM,  Cardiovascular: RRR, S1 normal, S2 normal, no murmurs, gallops, or rubs. Pulmonary/Chest: Clear to auscultation bilaterally, no wheezes, rales, or rhonchi. Abdominal: Soft, non-tender, non-distended, BS +, Extremities: No lower extremity edema bilaterally,  pulses symmetric and intact bilaterally. No cyanosis or clubbing. Neurological: A&O x3, Strength is normal and symmetric bilaterally, cranial nerve II-XII are grossly intact, no focal motor deficit, sensory intact to light touch bilaterally.  Psychiatric: Normal mood and affect.  Data Reviewed: Prior data reviewed as mentioned above  Assessment  and Plan: * Unstable angina (HCC) Patient with history of coronary artery disease, symptoms concerning for angina.  Slowly getting worse.  Cardiology is on board and planning a repeat cardiac catheterization on Wednesday. Admit to medical telemetry Trend troponin Echocardiogram Patient was started on heparin  infusion Nitroglycerin  paste Supportive care  Coronary artery disease S/p PCI in April 2025. -Going for a repeat cardiac cath on 7/2 -Continue aspirin , Plavix  and statin  Essential hypertension Currently blood pressure within goal. -Holding home metoprolol as  EKG shows first-degree heart block -Holding home lisinopril, spironolactone and torsemide  for concern of AKI -As needed hydralazine   Normocytic anemia Hemoglobin at 9.7 today, it was 13 in April 2025.  Denies any obvious bleeding.  Patient was on aspirin  and Plavix  at home. 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  Baseline creatinine of 1.5, currently at 1.93. - Holding home torsemide , spironolactone and lisinopril -Giving some gentle IV fluid -Monitor renal function -Avoid nephrotoxins  GERD (gastroesophageal reflux disease) - Continue with PPI    Advance Care Planning:   Code Status: Full Code   Consults: Cardiology  Family Communication: Discussed with patient.  No family at bedside  Severity of Illness: The appropriate patient status for this patient is INPATIENT. Inpatient status is judged to be reasonable and necessary in order to provide the required intensity of service to ensure the patient's safety. The patient's presenting symptoms, physical exam findings, and initial radiographic and laboratory data in the context of their chronic comorbidities is felt to place them at high risk for further clinical deterioration. Furthermore, it is not anticipated that the patient will be medically stable for discharge from  the hospital within 2 midnights of admission.   * I certify that at the point of admission it is my clinical judgment that the patient will require inpatient hospital care spanning beyond 2 midnights from the point of admission due to high intensity of service, high risk for further deterioration and high frequency of surveillance required.*  This record has been created using Conservation officer, historic buildings. Errors have been sought and corrected,but may not always be located. Such creation errors do not reflect on the standard of care.   Author: Amaryllis Dare, MD 09/05/2023 5:46 PM  For on call review [email or web address].

## 2023-09-05 NOTE — Assessment & Plan Note (Signed)
 Patient with history of coronary artery disease, symptoms concerning for angina.  Slowly getting worse.  Cardiology is on board and planning a repeat cardiac catheterization on Wednesday. Admit to medical telemetry Trend troponin Echocardiogram Patient was started on heparin  infusion Nitroglycerin  paste Supportive care

## 2023-09-05 NOTE — Assessment & Plan Note (Signed)
 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  Baseline creatinine of 1.5, currently at 1.93. - Holding home torsemide , spironolactone and lisinopril -Giving some gentle IV fluid -Monitor renal function -Avoid nephrotoxins

## 2023-09-05 NOTE — ED Triage Notes (Signed)
 First Nurse Note: Patient to ED from [facility] cardiology. PT reports CP since the weekend- has been taking nitroglycerin  for same. BP 70/50 in clinic.

## 2023-09-05 NOTE — Consult Note (Signed)
 PHARMACY - ANTICOAGULATION CONSULT NOTE  Pharmacy Consult for heparin  infusion Indication: chest pain/ACS  Allergies  Allergen Reactions   Beef-Derived Drug Products Nausea And Vomiting    Patient Measurements: Height: 5' 8 (172.7 cm) Weight: 81.2 kg (179 lb) IBW/kg (Calculated) : 68.4 HEPARIN  DW (KG): 81.2  Vital Signs: Temp: 98.2 F (36.8 C) (06/30 1456) Temp Source: Oral (06/30 1456) BP: 99/53 (06/30 1456) Pulse Rate: 75 (06/30 1456)  Labs: No results for input(s): HGB, HCT, PLT, APTT, LABPROT, INR, HEPARINUNFRC, HEPRLOWMOCWT, CREATININE, CKTOTAL, CKMB, TROPONINIHS in the last 72 hours.  CrCl cannot be calculated (Patient's most recent lab result is older than the maximum 21 days allowed.).   Medical History: Past Medical History:  Diagnosis Date   Basal cell carcinoma 01/17/2014   L lateral top of shoulder ant, L ant top of shoulder post   Basal cell carcinoma 01/01/2016   L mid back   Basal cell carcinoma 11/17/2017   R mid ear helix   Basal cell carcinoma 02/06/2019   L scalp/temple area within the hair above the sideburn/excision   Basal cell carcinoma 08/15/2019   Right chest medial infraclavicular. Superficial and nodular.    Basal cell carcinoma 08/15/2019   Left chest parasternal. Superficial.   Basal cell carcinoma 02/14/2020   L thigh above knee    Basal cell carcinoma 10/20/2022   R lat neck, EDC   Cancer (HCC)    Chronic kidney disease    renal cell carcinoma   Coronary artery disease    GERD (gastroesophageal reflux disease)    History of colon polyps    History of gastric polyp    History of hiatal hernia    Neuropathy    Non Hodgkin's lymphoma (HCC)    PONV (postoperative nausea and vomiting)    difficulty waking up after surgery   Reflux    Renal mass     Medications:  No home anticoagulants per pharmacist review  Assessment: 78 yo male presented to the ED from cardiologist office due to hypotension and  chest pain.  Pharmacy consulted to initiate heparin  infusion.  Baseline labs ordered  Goal of Therapy:  Heparin  level 0.3-0.7 units/ml Monitor platelets by anticoagulation protocol: Yes   Plan:  Give 4000 units bolus x 1 Start heparin  infusion at 950 units/hr Check anti-Xa level in 8 hours and daily while on heparin  Continue to monitor H&H and platelets  Kayla JULIANNA Blew, PharmD 09/05/2023,3:33 PM

## 2023-09-05 NOTE — Assessment & Plan Note (Signed)
 S/p PCI in April 2025. -Going for a repeat cardiac cath on 7/2 -Continue aspirin , Plavix  and statin

## 2023-09-05 NOTE — Assessment & Plan Note (Signed)
 Hemoglobin at 9.7 today, it was 13 in April 2025.  Denies any obvious bleeding.  Patient was on aspirin  and Plavix  at home. - Checking anemia panel -Continuing home iron supplement

## 2023-09-05 NOTE — Consult Note (Signed)
 [facility] CLINIC CARDIOLOGY CONSULT NOTE       Patient ID: Raymond Bennett. MRN: [id number] DOB/AGE: 80/09/1945 79 y.o.  Admit date: 09/05/2023 Referring Physician Dr. Viviann Primary Physician Corlis Honor BROCKS, MD Primary Cardiologist Dr. Fernanda. Pride Reason for Consultation chest pain  HPI: Raymond Bennett. is a 78 y.o. male  with a past medical history of coronary artery disease s/p DES to LAD/D1 bifurcation (06/22/2023 with Dr. 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 Patient seen by outpatient cardiology and sent to ED on 09/05/2023 due to concerning/worsening anginal symptoms for past 3 weeks. Patient describes as burning chest pain that is relieved with nitroglycerin . Cardiology was consulted for further evaluation.   Patient presented to the ED with burning chest pain that has worsened over the weekend. Patient has been having ongoing chest pain for past 3 weeks. Work up in the ED notable for sodium 133, potassium 4.7, creatinine 1.92, Hgb 9.7, plts 177. Lactic acid within normal limits. Lipids revealed LDL 31, TG 170, HDL 25. EKG with sinus rhythm with 1st degree AVB, RBBB, rate 81 bpm. Trop minimally elevated at 20, next trop will be drawn in 30 minutes. Patient started on heparin  infusion and given chewable ASA.   At the time of my evaluation this afternoon, patient was resting comfortably in ED stretcher. We discussed patients sxs in further detail. Patient states he's been having worsening chest discomfort for past 3 weeks. Over the weekend had significant chest burning with any sort of exertion. Patient states he took 10x nitroglycerin  for chest pain relief. Patient denies any chest discomfort at rest. Patient states whenever he gets up to walk or do any activity he had chest discomfort. Patient denies associated SOB.   Pertinent Cardiac History (Most recent) LHC with Dr. Katina 06/22/2023   Prox LAD  to Mid LAD lesion is 80% stenosed.   1st Diag lesion is 80% stenosed.   A stent was successfully placed.   A stent was successfully placed.   Post intervention, there is a 0% residual stenosis.   Post intervention, there is a 0% residual stenosis.   on 06/22/2023. 1.  Severe one-vessel CAD involving the LAD/D1 bifurcation 2.  Successful PTCA and stenting of the LAD/D1 bifurcation with a 2.75 x 22 mm Onyx drug-eluting stent in the LAD and a 2.25 x 8 mm Onyx drug-eluting stent in the diagonal branch with intravascular sound guidance and final kissing balloon inflation   Review of systems complete and found to be negative unless listed above    Past Medical History:  Diagnosis Date   Basal cell carcinoma 01/17/2014   L lateral top of shoulder ant, L ant top of shoulder post   Basal cell carcinoma 01/01/2016   L mid back   Basal cell carcinoma 11/17/2017   R mid ear helix   Basal cell carcinoma 02/06/2019   L scalp/temple area within the hair above the sideburn/excision   Basal cell carcinoma 08/15/2019   Right chest medial infraclavicular. Superficial and nodular.    Basal cell carcinoma 08/15/2019   Left chest parasternal. Superficial.   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   CHOLECYSTECTOMY     COLONOSCOPY WITH PROPOFOL  N/A 02/11/2017   Procedure: COLONOSCOPY WITH PROPOFOL ;  Surgeon: Viktoria Lamar DASEN, MD;   Location: [facility] ENDOSCOPY;  Service: Endoscopy;  Laterality: N/A;   CORONARY STENT INTERVENTION N/A 06/22/2023   Procedure: CORONARY STENT INTERVENTION;  Surgeon: Katina Albright, MD;  Location: [facility] INVASIVE CV LAB;  Service: Cardiovascular;  Laterality: N/A;   CORONARY ULTRASOUND/IVUS N/A 06/22/2023   Procedure: Coronary Ultrasound/IVUS;  Surgeon: Katina Albright, MD;  Location: [facility] INVASIVE CV LAB;  Service: Cardiovascular;  Laterality: N/A;   ESOPHAGOGASTRODUODENOSCOPY (EGD) WITH PROPOFOL  N/A 02/11/2017   Procedure: ESOPHAGOGASTRODUODENOSCOPY (EGD) WITH PROPOFOL ;  Surgeon: Viktoria Lamar DASEN, MD;  Location: [facility] ENDOSCOPY;  Service: Endoscopy;  Laterality: N/A;   FRACTURE SURGERY     GALLBLADDER SURGERY     HERNIA REPAIR Left    Inguinal Hernia Repair   LEFT HEART CATH AND CORONARY ANGIOGRAPHY Left 06/22/2023   Procedure: LEFT HEART CATH AND CORONARY ANGIOGRAPHY;  Surgeon: Katina Albright, MD;  Location: [facility] INVASIVE CV LAB;  Service: Cardiovascular;  Laterality: Left;   PACEMAKER LEADLESS INSERTION N/A 11/02/2022   Procedure: PACEMAKER LEADLESS INSERTION;  Surgeon: Ammon Blunt, MD;  Location: [facility] INVASIVE CV LAB;  Service: Cardiovascular;  Laterality: N/A;   ROBOTIC ASSITED PARTIAL NEPHRECTOMY Left 11/12/2015   Procedure: ROBOTIC ASSITED PARTIAL NEPHRECTOMY;  Surgeon: Redell Lynwood Napoleon, MD;  Location: [facility] ORS;  Service: Urology;  Laterality: Left;   TONSILLECTOMY      (Not in a hospital admission)  Social History   Socioeconomic History   Marital status: Married    Spouse name: Erminio   Number of children: 0   Years of education: Not on file   Highest education level: Not on file  Occupational History   Not on file  Tobacco Use   Smoking status: Never   Smokeless tobacco: Never  Vaping Use   Vaping status: Never Used  Substance and Sexual Activity   Alcohol use: No   Drug use: No   Sexual activity: Not on file  Other Topics Concern   Not on file  Social History  Narrative   Lives at home with wife    Social Drivers of Corporate investment banker Strain: Not on file  Food Insecurity: Not on file  Transportation Needs: Not on file  Physical Activity: Not on file  Stress: Not on file  Social Connections: Not on file  Intimate Partner Violence: Not on file    History reviewed. No pertinent family history.   Vitals:   09/05/23 1455 09/05/23 1456 09/05/23 1530 09/05/23 1600  BP:  (!) 99/53 104/65 110/61  Pulse:  75 78 72  Resp:  20 16 15   Temp:  98.2 F (36.8 C)    TempSrc:  Oral    SpO2:  98% 97% 97%  Weight: 81.2 kg     Height: 5' 8 (1.727 m)       PHYSICAL EXAM General: Well appearing elderly male, well nourished, in no acute distress. HEENT: Normocephalic and atraumatic. Neck: No JVD.   Lungs: Normal  respiratory effort on room air. Clear bilaterally to auscultation. No wheezes, crackles, rhonchi.  Heart: HRRR. Normal S1 and S2 without gallops or murmurs.  Abdomen: Non-distended appearing.  Msk: Normal strength and tone for age. Extremities: Warm and well perfused. No clubbing, cyanosis, edema.  Neuro: Alert and oriented X 3. Psych: Answers questions appropriately.   Labs: Basic Metabolic Panel: Recent Labs    09/05/23 1507  NA 133*  K 4.7  CL 103  CO2 20*  GLUCOSE 125*  BUN 38*  CREATININE 1.92*  CALCIUM 9.2   Liver Function Tests: No results for input(s): AST, ALT, ALKPHOS, BILITOT, PROT, ALBUMIN in the last 72 hours. No results for input(s): LIPASE, AMYLASE in the last 72 hours. CBC: No results for input(s): WBC, NEUTROABS, HGB, HCT, MCV, PLT in the last 72 hours. Cardiac Enzymes: Recent Labs    09/05/23 1507  TROPONINIHS 20*   BNP: No results for input(s): BNP in the last 72 hours. D-Dimer: No results for input(s): DDIMER in the last 72 hours. Hemoglobin A1C: No results for input(s): HGBA1C in the last 72 hours. Fasting Lipid Panel: No results for input(s): CHOL,  HDL, LDLCALC, TRIG, CHOLHDL, LDLDIRECT in the last 72 hours. Thyroid Function Tests: No results for input(s): TSH, T4TOTAL, T3FREE, THYROIDAB in the last 72 hours.  Invalid input(s): FREET3 Anemia Panel: No results for input(s): VITAMINB12, FOLATE, FERRITIN, TIBC, IRON, RETICCTPCT in the last 72 hours.   Radiology: DG Chest 2 View Result Date: 09/05/2023 CLINICAL DATA:  Chest pain EXAM: CHEST - 2 VIEW COMPARISON:  03/01/2018 FINDINGS: No acute airspace disease or effusion. Stable cardiomediastinal silhouette with aortic atherosclerosis. Interval probable lead less pacemaker. No pneumothorax IMPRESSION: No active cardiopulmonary disease. Electronically Signed   By: Luke Bun M.D.   On: 09/05/2023 15:56    ECHO ordered.  TELEMETRY reviewed by me 09/05/2023: sinu rhythm, rate 70s  EKG reviewed by me: sinus rhythm with 1st degree AVB, RBBB, rate 81 bpm  Data reviewed by me 09/05/2023: last 24h vitals tele labs imaging I/O ED provider note, admission H&P.  Active Problems:   * No active hospital problems. *    ASSESSMENT AND PLAN:  Raymond Bennett. is a 78 y.o. male  with a past medical history of coronary artery disease s/p DES to LAD/D1 bifurcation (06/22/2023 with Dr. 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 Patient seen by outpatient cardiology and sent to ED on 09/05/2023 due to concerning/worsening anginal symptoms for past 3 weeks. Patient describes as burning chest pain that is relieved with nitroglycerin . Cardiology was consulted for further evaluation.   # Unstable Angina # CAD s/p DES to LAD/D1 bifurcation (06/22/23) # Hypertension # Hyperlipidemia Patient presents with burning chest pain for past 3 weeks, worsened over the weekend. Chest discomfort relieved with nitroglycerin . EKG with sinus rhythm with 1st degree AVB, RBBB, rate 81 bpm. Trop minimally elevated at 20.  Lipids revealed LDL 31, TG 170, HDL 25.  -Echo ordered. -Continue to trend trops until peaked.  -Continue heparin  infusion. -Ordered nitroglycerin  ointment every 6 hours for anginal symptoms.      (Patient prescribed Imdur 30 mg at outpatient clinic on 06/18, will consider resuming prior to discharge).  -Continue home ASA 81 mg, plavix  75 mg, rosuvastatin 20 mg daily. -Resume home metoprolol tartrate 25 mg twice daily when BP allows. -Consider resuming home lisinopril 10 mg daily when BP and renal function allows.        (Patient was  hypotensive at outpatient cardiology appointment 06/30) -Plan for [facility] with Dr. Katina on 07/02 at 9:30 AM. NPO starting midnight on 07/01. -Discussed the risks and benefits of proceeding with LHC for further evaluation with the patient.  He is agreeable to proceed.  NPO started midnight before LHC with Dr. Katina on 07/02.  Written consent will be obtained.  Further recommendations following LHC.   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 Appears euvolemic on exam.  -Echo ordered. BNP ordered. -Continue home empagliflozin 10 mg daily. -Consider resuming home spironolactone 25 mg daily when renal function allows.  -Metoprolol and Lisinopril as stated above.  -Home dose of diuretics is PO torsemide  20 mg daily. Resume for fluid overloaded sxs.   This patient's plan of care was discussed and created with Dr. Wilburn and he is in agreement.  Signed: Tylar Merendino, PA-C  09/05/2023, 4:25 PM [facility] Cardiology

## 2023-09-05 NOTE — ED Triage Notes (Signed)
 Pt to ED for burning chest pain for several days until today. Pain is is bilateral to upper chest. Started taking PRN NTG on 6/17 and has been using them. Sent from [facility] cardiology. Hypotensive at [facility] 70/50. 99/53 in triage. Denies dizziness, nausea at this time.  Took SL NTG at 230am and 530am today, then L/A NTG at 545am. Pt had near syncopal episode yesterday.  Skin is dry, respirations unlabored.

## 2023-09-05 NOTE — Assessment & Plan Note (Signed)
 Currently blood pressure within goal. -Holding home metoprolol as EKG shows first-degree heart block -Holding home lisinopril, spironolactone and torsemide  for concern of AKI -As needed hydralazine 

## 2023-09-06 ENCOUNTER — Ambulatory Visit

## 2023-09-06 ENCOUNTER — Inpatient Hospital Stay: Admit: 2023-09-06 | Discharge: 2023-09-06 | Disposition: A | Discharge status: Home / Self Care

## 2023-09-06 DIAGNOSIS — I25110 Atherosclerotic heart disease of native coronary artery with unstable angina pectoris: Secondary | ICD-10-CM

## 2023-09-06 DIAGNOSIS — I10 Essential (primary) hypertension: Secondary | ICD-10-CM | POA: Diagnosis not present

## 2023-09-06 DIAGNOSIS — D649 Anemia, unspecified: Secondary | ICD-10-CM | POA: Diagnosis not present

## 2023-09-06 LAB — BASIC METABOLIC PANEL WITH GFR
Anion gap: 9 (ref 5–15)
BUN: 40 mg/dL — ABNORMAL HIGH (ref 8–23)
CO2: 22 mmol/L (ref 22–32)
Calcium: 9.4 mg/dL (ref 8.9–10.3)
Chloride: 104 mmol/L (ref 98–111)
Creatinine, Ser: 1.74 mg/dL — ABNORMAL HIGH (ref 0.61–1.24)
GFR, Estimated: 39 mL/min — ABNORMAL LOW (ref >60)
Glucose, Bld: 99 mg/dL (ref 70–99)
Potassium: 5.4 mmol/L — ABNORMAL HIGH (ref 3.5–5.1)
Sodium: 135 mmol/L (ref 135–145)

## 2023-09-06 LAB — CBC
HCT: 35.5 % — ABNORMAL LOW (ref 39.0–52.0)
Hemoglobin: 11.5 g/dL — ABNORMAL LOW (ref 13.0–17.0)
MCH: 32.5 pg (ref 26.0–34.0)
MCHC: 32.4 g/dL (ref 30.0–36.0)
MCV: 100.3 fL — ABNORMAL HIGH (ref 80.0–100.0)
Platelets: 201 10*3/uL (ref 150–400)
RBC: 3.54 MIL/uL — ABNORMAL LOW (ref 4.22–5.81)
RDW: 13.7 % (ref 11.5–15.5)
WBC: 10.8 10*3/uL — ABNORMAL HIGH (ref 4.0–10.5)
nRBC: 0 % (ref 0.0–0.2)

## 2023-09-06 LAB — ECHOCARDIOGRAM COMPLETE
AR max vel: 2.52 cm2
AV Area VTI: 2.58 cm2
AV Area mean vel: 2.49 cm2
AV Mean grad: 2 mmHg
AV Peak grad: 4.1 mmHg
Ao pk vel: 1.01 m/s
Area-P 1/2: 4.41 cm2
Calc EF: 30.2 %
Height: 68 in
MV VTI: 1.61 cm2
S' Lateral: 3.2 cm
Single Plane A2C EF: 20.4 %
Single Plane A4C EF: 35.8 %
Weight: 2864 [oz_av]

## 2023-09-06 LAB — VITAMIN B12: Vitamin B-12: 489 pg/mL (ref 180–914)

## 2023-09-06 LAB — HEPARIN LEVEL (UNFRACTIONATED)
Heparin Unfractionated: 0.44 [IU]/mL (ref 0.30–0.70)
Heparin Unfractionated: <0.1 [IU]/mL — ABNORMAL LOW (ref 0.30–0.70)

## 2023-09-06 MED ORDER — HEPARIN BOLUS VIA INFUSION
2400.0000 [IU] | Freq: Once | INTRAVENOUS | Status: DC
  Filled 2023-09-06: qty 2400

## 2023-09-06 MED ORDER — PERFLUTREN LIPID MICROSPHERE
1.0000 mL | INTRAVENOUS | Status: AC | PRN
  Administered 2023-09-06: 2 mL via INTRAVENOUS

## 2023-09-06 NOTE — ED Notes (Signed)
 Echo at bedside

## 2023-09-06 NOTE — ED Notes (Signed)
 Pt stick attempted by tech and RN. Lab called.

## 2023-09-06 NOTE — Progress Notes (Signed)
 [facility] CLINIC CARDIOLOGY PROGRESS NOTE       Patient ID: Raymond Bennett. MRN: [id number] DOB/AGE: 80/09/1945 79 y.o.  Admit date: 09/05/2023 Referring Physician Dr. Viviann Primary Physician Corlis Honor BROCKS, MD Primary Cardiologist Dr. Fernanda. Pride Reason for Consultation chest pain  HPI: Raymond Bennett. is a 78 y.o. male  with a past medical history of coronary artery disease s/p DES to LAD/D1 bifurcation (06/22/2023 with Dr. 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 Patient seen by outpatient cardiology and sent to ED on 09/05/2023 due to concerning/worsening anginal symptoms for past 3 weeks. Patient describes as burning chest pain that is relieved with nitroglycerin . Cardiology was consulted for further evaluation.   Interval History: -Patient seen and examined this afternoon and laying comfortably in hospital bed. Patient states he feels good today and states his chest pain has improved with nitropaste. -Patients BP and HR stable this afternoon. Overnight Tele showed no significant events.  -Patient remains on room air with stable SpO2.  -Patient is leaving hospital AMA. -Patient states he wants to go home and come back for outpatient LHC tomorrow. However, highly recommended patient to stay overnight due to his recurrent chest pain sxs and newly reduced EF. Also revealed to patient that having a outpatient LHC can delay the LHC scheduled time. Patient states he understands and refuses to stay the night for any reason.    Pertinent Cardiac History (Most recent) LHC with Dr. Katina 06/22/2023   Prox LAD to Mid LAD lesion is 80% stenosed.   1st Diag lesion is 80% stenosed.   A stent was successfully placed.   A stent was successfully placed.   Post intervention, there is a 0% residual stenosis.   Post intervention, there is a 0% residual stenosis.   on 06/22/2023. 1.  Severe one-vessel CAD involving  the LAD/D1 bifurcation 2.  Successful PTCA and stenting of the LAD/D1 bifurcation with a 2.75 x 22 mm Onyx drug-eluting stent in the LAD and a 2.25 x 8 mm Onyx drug-eluting stent in the diagonal branch with intravascular sound guidance and final kissing balloon inflation   Review of systems complete and found to be negative unless listed above    Past Medical History:  Diagnosis Date   Basal cell carcinoma 01/17/2014   L lateral top of shoulder ant, L ant top of shoulder post   Basal cell carcinoma 01/01/2016   L mid back   Basal cell carcinoma 11/17/2017   R mid ear helix   Basal cell carcinoma 02/06/2019   L scalp/temple area within the hair above the sideburn/excision   Basal cell carcinoma 08/15/2019   Right chest medial infraclavicular. Superficial and nodular.    Basal cell carcinoma 08/15/2019   Left chest parasternal. Superficial.   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   CHOLECYSTECTOMY     COLONOSCOPY WITH PROPOFOL  N/A 02/11/2017   Procedure: COLONOSCOPY WITH PROPOFOL ;  Surgeon: Viktoria Lamar DASEN, MD;  Location: [facility] ENDOSCOPY;  Service: Endoscopy;  Laterality: N/A;   CORONARY STENT INTERVENTION N/A 06/22/2023   Procedure: CORONARY STENT INTERVENTION;  Surgeon: Katina Albright, MD;  Location: [facility] INVASIVE CV LAB;  Service: Cardiovascular;  Laterality: N/A;   CORONARY ULTRASOUND/IVUS N/A 06/22/2023   Procedure:  Coronary Ultrasound/IVUS;  Surgeon: Katina Albright, MD;  Location: [facility] INVASIVE CV LAB;  Service: Cardiovascular;  Laterality: N/A;   ESOPHAGOGASTRODUODENOSCOPY (EGD) WITH PROPOFOL  N/A 02/11/2017   Procedure: ESOPHAGOGASTRODUODENOSCOPY (EGD) WITH PROPOFOL ;  Surgeon: Viktoria Lamar DASEN, MD;  Location: [facility] ENDOSCOPY;  Service: Endoscopy;  Laterality: N/A;   FRACTURE SURGERY     GALLBLADDER SURGERY     HERNIA REPAIR Left    Inguinal Hernia Repair   LEFT HEART CATH AND CORONARY ANGIOGRAPHY Left 06/22/2023   Procedure: LEFT HEART CATH AND CORONARY ANGIOGRAPHY;  Surgeon: Katina Albright, MD;  Location: [facility] INVASIVE CV LAB;  Service: Cardiovascular;  Laterality: Left;   PACEMAKER LEADLESS INSERTION N/A 11/02/2022   Procedure: PACEMAKER LEADLESS INSERTION;  Surgeon: Ammon Blunt, MD;  Location: [facility] INVASIVE CV LAB;  Service: Cardiovascular;  Laterality: N/A;   ROBOTIC ASSITED PARTIAL NEPHRECTOMY Left 11/12/2015   Procedure: ROBOTIC ASSITED PARTIAL NEPHRECTOMY;  Surgeon: Redell Lynwood Napoleon, MD;  Location: [facility] ORS;  Service: Urology;  Laterality: Left;   TONSILLECTOMY      (Not in a hospital admission)  Social History   Socioeconomic History   Marital status: Married    Spouse name: Erminio   Number of children: 0   Years of education: Not on file   Highest education level: Not on file  Occupational History   Not on file  Tobacco Use   Smoking status: Never   Smokeless tobacco: Never  Vaping Use   Vaping status: Never Used  Substance and Sexual Activity   Alcohol use: No   Drug use: No   Sexual activity: Not on file  Other Topics Concern   Not on file  Social History Narrative   Lives at home with wife    Social Drivers of Corporate investment banker Strain: Not on file  Food Insecurity: Not on file  Transportation Needs: Not on file  Physical Activity: Not on file  Stress: Not on file  Social Connections: Not on file  Intimate Partner Violence: Not on file    History  reviewed. No pertinent family history.   Vitals:   09/06/23 0230 09/06/23 0606 09/06/23 0630 09/06/23 0942  BP: 104/65  108/64 113/63  Pulse: 62   85  Resp: 10  18 16   Temp:  (!) 97.3 F (36.3 C)  (!) 97.4 F (36.3 C)  TempSrc:  Tympanic  Oral  SpO2: 100%   99%  Weight:      Height:        PHYSICAL EXAM General: Well appearing elderly male, well nourished, in no acute distress. HEENT: Normocephalic and atraumatic. Neck: No JVD.   Lungs: Normal respiratory effort on room air. Clear bilaterally to auscultation. No wheezes, crackles, rhonchi.  Heart: HRRR. Normal S1 and S2 without gallops or murmurs.  Abdomen: Non-distended appearing.  Msk: Normal strength and tone for age. Extremities: Warm and well perfused. No clubbing, cyanosis, edema.  Neuro: Alert and oriented X 3. Psych: Answers questions appropriately.   Labs:  Basic Metabolic Panel: Recent Labs    09/05/23 1507 09/05/23 1529 09/06/23 0601  NA 133*  --  135  K 4.7  --  5.4*  CL 103  --  104  CO2 20*  --  22  GLUCOSE 125*  --  99  BUN 38*  --  40*  CREATININE 1.92* 2.04* 1.74*  CALCIUM 9.2  --  9.4   Liver Function Tests: No results for input(s): AST, ALT, ALKPHOS, BILITOT, PROT, ALBUMIN in the last 72 hours. No results for input(s): LIPASE, AMYLASE in the last 72 hours. CBC: Recent Labs    09/05/23 1819 09/06/23 0601  WBC 9.2 10.8*  HGB 9.8* 11.5*  HCT 29.3* 35.5*  MCV 98.0 100.3*  PLT 167 201   Cardiac Enzymes: Recent Labs    09/05/23 1507 09/05/23 1529  TROPONINIHS 20* 22*   BNP: Recent Labs    09/05/23 1610  BNP 198.1*   D-Dimer: No results for input(s): DDIMER in the last 72 hours. Hemoglobin A1C: No results for input(s): HGBA1C in the last 72 hours. Fasting Lipid Panel: Recent Labs    09/05/23 1610  CHOL 90  HDL 25*  LDLCALC 31  TRIG 829*  CHOLHDL 3.6   Thyroid Function Tests: No results for input(s): TSH, T4TOTAL, T3FREE, THYROIDAB in the last  72 hours.  Invalid input(s): FREET3 Anemia Panel: Recent Labs    09/05/23 1529 09/05/23 1819  VITAMINB12  --  489  FOLATE 24.0  --   FERRITIN 199  --   TIBC 251  --   IRON 92  --   RETICCTPCT  --  1.6     Radiology: ECHOCARDIOGRAM COMPLETE Result Date: 09/06/2023    ECHOCARDIOGRAM REPORT   Patient Name:   Jarone Ostergaard. Date of Exam: 09/06/2023 Medical Rec #:  [id number]        Height:       68.0 in Accession #:    [id number]       Weight:       179.0 lb Date of Birth:  26-May-1945         BSA:          1.950 m Patient Age:    77 years         BP:           108/64 mmHg Patient Gender: M                HR:           78 bpm. Exam Location:  [facility] Procedure: 2D Echo, Cardiac Doppler, Color Doppler and Intracardiac            Opacification Agent (Both Spectral and Color Flow Doppler were            utilized during procedure). Indications:     Chest Pain R07.9  History:         Patient has no prior history of Echocardiogram examinations.                  CAD and Previous Myocardial Infarction, Pacemaker;                  Signs/Symptoms:Chest Pain.  Sonographer:     Ashley McNeely-Sloane Referring Phys:  [id number] Carrine Kroboth Diagnosing Phys: Keller Alluri IMPRESSIONS  1. Left ventricular ejection fraction, by estimation, is 35 to 40%. The left ventricle has moderately decreased function. The left ventricle demonstrates regional wall motion abnormalities (see scoring diagram/findings for description).  Left ventricular  diastolic parameters are consistent with Grade I diastolic dysfunction (impaired relaxation).  2. Right ventricular systolic function is normal. The right ventricular size is normal.  3. The mitral valve is normal in structure. No evidence of mitral valve regurgitation.  4. The aortic valve is tricuspid. Aortic valve regurgitation is not visualized.  5. The inferior vena cava is normal in size with greater than 50% respiratory variability, suggesting right atrial pressure of 3 mmHg.  FINDINGS  Left Ventricle: Left ventricular ejection fraction, by estimation, is 35 to 40%. The left ventricle has moderately decreased function. The left ventricle demonstrates regional wall motion abnormalities. Definity contrast agent was given IV to delineate the left ventricular endocardial borders. The left ventricular internal cavity size was normal in size. There is no left ventricular hypertrophy. Left ventricular diastolic parameters are consistent with Grade I diastolic dysfunction (impaired relaxation).  LV Wall Scoring: The apical septal segment is akinetic. The anterior septum, apical lateral segment, mid inferoseptal segment, apical anterior segment, apical inferior segment, and apex are hypokinetic. The anterior wall, antero-lateral wall, inferior wall, posterior wall, and basal inferoseptal segment are normal. Right Ventricle: The right ventricular size is normal. No increase in right ventricular wall thickness. Right ventricular systolic function is normal. Left Atrium: Left atrial size was normal in size. Right Atrium: Right atrial size was normal in size. Pericardium: There is no evidence of pericardial effusion. Mitral Valve: The mitral valve is normal in structure. No evidence of mitral valve regurgitation. MV peak gradient, 8.2 mmHg. The mean mitral valve gradient is 3.0 mmHg. Tricuspid Valve: The tricuspid valve is normal in structure. Tricuspid valve regurgitation is trivial. Aortic Valve: The aortic valve is tricuspid. Aortic valve regurgitation is not visualized. Aortic valve mean gradient measures 2.0 mmHg. Aortic valve peak gradient measures 4.1 mmHg. Aortic valve area, by VTI measures 2.58 cm. Pulmonic Valve: The pulmonic valve was not well visualized. Pulmonic valve regurgitation is not visualized. Aorta: The aortic root and ascending aorta are structurally normal, with no evidence of dilitation. Venous: The inferior vena cava is normal in size with greater than 50% respiratory  variability, suggesting right atrial pressure of 3 mmHg. IAS/Shunts: The atrial septum is grossly normal.  LEFT VENTRICLE PLAX 2D LVIDd:         4.70 cm     Diastology LVIDs:         3.20 cm     LV e' medial:    5.98 cm/s LV PW:         0.90 cm     LV E/e' medial:  10.0 LV IVS:        0.90 cm     LV e' lateral:   5.55 cm/s LVOT diam:     2.10 cm     LV E/e' lateral: 10.8 LV SV:         52 LV SV Index:   27 LVOT Area:     3.46 cm  LV Volumes (MOD) LV vol d, MOD A2C: 64.1 ml LV vol d, MOD A4C: 80.5 ml LV vol s, MOD A2C: 51.0 ml LV vol s, MOD A4C: 51.7 ml LV SV MOD A2C:     13.1 ml LV SV MOD A4C:     80.5 ml LV SV MOD BP:      22.7 ml RIGHT VENTRICLE             IVC RV Basal diam:  3.00 cm     IVC diam: 1.20 cm RV  Mid diam:    2.70 cm RV S prime:     10.40 cm/s TAPSE (M-mode): 1.3 cm LEFT ATRIUM             Index        RIGHT ATRIUM          Index LA diam:        3.10 cm 1.59 cm/m   RA Area:     8.45 cm LA Vol (A2C):   36.2 ml 18.57 ml/m  RA Volume:   15.90 ml 8.16 ml/m LA Vol (A4C):   21.8 ml 11.18 ml/m LA Biplane Vol: 28.4 ml 14.57 ml/m  AORTIC VALVE                    PULMONIC VALVE AV Area (Vmax):    2.52 cm     PV Vmax:        0.99 m/s AV Area (Vmean):   2.49 cm     PV Vmean:       66.600 cm/s AV Area (VTI):     2.58 cm     PV VTI:         0.208 m AV Vmax:           101.00 cm/s  PV Peak grad:   3.9 mmHg AV Vmean:          69.900 cm/s  PV Mean grad:   2.0 mmHg AV VTI:            0.203 m      RVOT Peak grad: 2 mmHg AV Peak Grad:      4.1 mmHg AV Mean Grad:      2.0 mmHg LVOT Vmax:         73.40 cm/s LVOT Vmean:        50.300 cm/s LVOT VTI:          0.151 m LVOT/AV VTI ratio: 0.74  AORTA Ao Root diam: 3.00 cm Ao Asc diam:  3.10 cm MITRAL VALVE MV Area (PHT): 4.41 cm     SHUNTS MV Area VTI:   1.61 cm     Systemic VTI:  0.15 m MV Peak grad:  8.2 mmHg     Systemic Diam: 2.10 cm MV Mean grad:  3.0 mmHg     Pulmonic VTI:  0.134 m MV Vmax:       1.43 m/s MV Vmean:      73.0 cm/s MV Decel Time: 172 msec MV E  velocity: 59.70 cm/s MV A velocity: 122.00 cm/s MV E/A ratio:  0.49 Keller Paterson Electronically signed by Keller Paterson Signature Date/Time: 09/06/2023/8:44:18 AM    Final    DG Chest 2 View Result Date: 09/05/2023 CLINICAL DATA:  Chest pain EXAM: CHEST - 2 VIEW COMPARISON:  03/01/2018 FINDINGS: No acute airspace disease or effusion. Stable cardiomediastinal silhouette with aortic atherosclerosis. Interval probable lead less pacemaker. No pneumothorax IMPRESSION: No active cardiopulmonary disease. Electronically Signed   By: Luke Bun M.D.   On: 09/05/2023 15:56    ECHO as above.  TELEMETRY reviewed by me 09/06/2023: sinu rhythm, rate 70s   EKG reviewed by me: sinus rhythm with 1st degree AVB, RBBB, rate 81 bpm  Data reviewed by me 09/06/2023: last 24h vitals tele labs imaging I/O hospitalist progress notes.  Principal Problem:   Unstable angina (HCC) Active Problems:   Coronary artery disease   GERD (gastroesophageal reflux disease)   Normocytic anemia   Essential hypertension   AKI (acute  kidney injury) (HCC)    ASSESSMENT AND PLAN:  Jasen Hartstein. is a 78 y.o. male  with a past medical history of coronary artery disease s/p DES to LAD/D1 bifurcation (06/22/2023 with Dr. 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 Patient seen by outpatient cardiology and sent to ED on 09/05/2023 due to concerning/worsening anginal symptoms for past 3 weeks. Patient describes as burning chest pain that is relieved with nitroglycerin . Cardiology was consulted for further evaluation.   # Unstable Angina # CAD s/p DES to LAD/D1 bifurcation (06/22/23) # Hypertension # Hyperlipidemia Patient presents with burning chest pain for past 3 weeks, worsened over the weekend. Chest discomfort relieved with nitroglycerin . EKG with sinus rhythm with 1st degree AVB, RBBB, rate 81 bpm. Trop minimally elevated at 20 > 22. Lipids revealed LDL  31, TG 170, HDL 25.  -Continue heparin  infusion. -Continue nitroglycerin  ointment every 6 hours for anginal symptoms.      (Patient prescribed Imdur 30 mg at outpatient clinic on 06/18, will consider resuming prior to discharge).  -Continue home ASA 81 mg, plavix  75 mg, rosuvastatin 20 mg daily. -Continue home metoprolol tartrate 25 mg twice daily. -Plan to resume home lisinopril 10 mg daily if BP becomes elevated.       (Patient was hypotensive at outpatient cardiology appointment 06/30) -Planned for LHC with Dr. Katina on 07/02 at 9:30 AM. NPO starting midnight. -Discussed the risks and benefits of proceeding with LHC for further evaluation with the patient.  He is agreeable to proceed.  NPO started midnight before LHC with Dr. Katina on 07/02.  Written consent will be obtained.  Further recommendations following LHC.   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 Appears euvolemic on exam. BNP elevated at 200. Echo this admission with rEF (35-40%), grade 1 diastolic dysfunction, +RWMA (akinetic apical septal segment, hypokinesis of multiple segments as stated above). Prior EF was 50% on 11/2022. -D/c home empagliflozin 10 mg daily due to hyperkalemia. Plan to resume when potassium normalizes.  -Plan to resume home spironolactone 25 mg daily when renal function allows and when potassium normalizes.  -Metoprolol and Lisinopril as stated above.  -Home dose of diuretics is PO torsemide  20 mg daily. Plan to resume after LHC for fluid overload sxs. -Plan to optimize GDMT as BP, renal function and electrolytes allow.   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 Cr improved this AM to 1.73. -Will order IVFs overnight to help kidney function prior to cath.  Patient is leaving hospital AMA. Patient states he does not want to stay another night in hospital and wants to go home. Highly recommended that patient stay the night due to concerning recurrent chest pain  sxs and newly reduced EF, to perform inpatient LHC with Dr. Katina tomorrow at 11:30 AM. Explained to the patient that leaving AMA and having an outpatient LHC scheduled can delay the appointment and he may not be able to do his LHC tomorrow due to insurance prior authorization needs. Patient states he understands and will wait to do LHC so he can go home today. Discussed the risk of leaving hospital and patient states he understands completely. Patient states there is nothing that can be said to keep patient in hospital and he refuses any more care in hospital at this time. Patient's wife was at bedside during entire conversation and wife also agrees with patients wishes.    This patient's  plan of care was discussed and created with Dr. Wilburn and he is in agreement.  Signed: Dorene Comfort, PA-C  09/06/2023, 10:50 AM [facility] Cardiology

## 2023-09-06 NOTE — Consult Note (Signed)
 PHARMACY - ANTICOAGULATION CONSULT NOTE  Pharmacy Consult for heparin  infusion Indication: chest pain/ACS  Allergies  Allergen Reactions   Beef-Derived Drug Products Nausea And Vomiting    Patient Measurements: Height: 5' 8 (172.7 cm) Weight: 81.2 kg (179 lb) IBW/kg (Calculated) : 68.4 HEPARIN  DW (KG): 81.2  Vital Signs: Temp: 97.4 F (36.3 C) (07/01 0942) Temp Source: Oral (07/01 0942) BP: 113/63 (07/01 0942) Pulse Rate: 85 (07/01 0942)  Labs: Recent Labs    09/05/23 1507 09/05/23 1529 09/05/23 1610 09/05/23 1610 09/05/23 1819 09/06/23 0125 09/06/23 0601 09/06/23 1115  HGB  --   --  9.7*   < > 9.8*  --  11.5*  --   HCT  --   --  29.2*  --  29.3*  --  35.5*  --   PLT  --   --  177  --  167  --  201  --   APTT  --   --  31  --   --   --   --   --   LABPROT  --   --  14.3  --   --   --   --   --   INR  --   --  1.0  --   --   --   --   --   HEPARINUNFRC  --   --   --   --   --  0.44  --  <0.10*  CREATININE 1.92* 2.04*  --   --   --   --  1.74*  --   TROPONINIHS 20* 22*  --   --   --   --   --   --    < > = values in this interval not displayed.    Estimated Creatinine Clearance: 33.3 mL/min (A) (by C-G formula based on SCr of 1.74 mg/dL (H)).   Medical History: Past Medical History:  Diagnosis Date   Basal cell carcinoma 01/17/2014   L lateral top of shoulder ant, L ant top of shoulder post   Basal cell carcinoma 01/01/2016   L mid back   Basal cell carcinoma 11/17/2017   R mid ear helix   Basal cell carcinoma 02/06/2019   L scalp/temple area within the hair above the sideburn/excision   Basal cell carcinoma 08/15/2019   Right chest medial infraclavicular. Superficial and nodular.    Basal cell carcinoma 08/15/2019   Left chest parasternal. Superficial.   Basal cell carcinoma 02/14/2020   L thigh above knee    Basal cell carcinoma 10/20/2022   R lat neck, EDC   Cancer (HCC)    Chronic kidney disease    renal cell carcinoma   Coronary artery  disease    GERD (gastroesophageal reflux disease)    History of colon polyps    History of gastric polyp    History of hiatal hernia    Neuropathy    Non Hodgkin's lymphoma (HCC)    PONV (postoperative nausea and vomiting)    difficulty waking up after surgery   Reflux    Renal mass     Medications:  No home anticoagulants per pharmacist review  Assessment: 78 yo male presented to the ED from cardiologist office due to hypotension and chest pain.  Pharmacy consulted to initiate heparin  infusion.  Baseline labs ordered  Goal of Therapy:  Heparin  level 0.3-0.7 units/ml Monitor platelets by anticoagulation protocol: Yes  7/1 0125 HL 0.44, therapeutic x 1 7/1 1115 HL <0.10,  subtherapeutic @ 950 u/hr   Plan:  HL subtherapeutic.  Nurse said there were no known interruptions in infusion. Give heparin  2400 units IV x 1 Increase heparin  infusion to 1150 units/hr Recheck HL 8 hours after rate change CBC daily while on heparin   Kayla JULIANNA Blew, PharmD 09/06/2023,12:52 PM

## 2023-09-06 NOTE — ED Notes (Signed)
Informed RN bed assigned 

## 2023-09-06 NOTE — Consult Note (Signed)
 PHARMACY - ANTICOAGULATION CONSULT NOTE  Pharmacy Consult for heparin  infusion Indication: chest pain/ACS  Allergies  Allergen Reactions   Beef-Derived Drug Products Nausea And Vomiting    Patient Measurements: Height: 5' 8 (172.7 cm) Weight: 81.2 kg (179 lb) IBW/kg (Calculated) : 68.4 HEPARIN  DW (KG): 81.2  Vital Signs: Temp: 97.4 F (36.3 C) (06/30 2307) Temp Source: Oral (06/30 2307) BP: 122/64 (07/01 0056) Pulse Rate: 61 (07/01 0056)  Labs: Recent Labs    09/05/23 1507 09/05/23 1529 09/05/23 1610 09/05/23 1819 09/06/23 0125  HGB  --   --  9.7* 9.8*  --   HCT  --   --  29.2* 29.3*  --   PLT  --   --  177 167  --   APTT  --   --  31  --   --   LABPROT  --   --  14.3  --   --   INR  --   --  1.0  --   --   HEPARINUNFRC  --   --   --   --  0.44  CREATININE 1.92* 2.04*  --   --   --   TROPONINIHS 20* 22*  --   --   --     Estimated Creatinine Clearance: 28.4 mL/min (A) (by C-G formula based on SCr of 2.04 mg/dL (H)).   Medical History: Past Medical History:  Diagnosis Date   Basal cell carcinoma 01/17/2014   L lateral top of shoulder ant, L ant top of shoulder post   Basal cell carcinoma 01/01/2016   L mid back   Basal cell carcinoma 11/17/2017   R mid ear helix   Basal cell carcinoma 02/06/2019   L scalp/temple area within the hair above the sideburn/excision   Basal cell carcinoma 08/15/2019   Right chest medial infraclavicular. Superficial and nodular.    Basal cell carcinoma 08/15/2019   Left chest parasternal. Superficial.   Basal cell carcinoma 02/14/2020   L thigh above knee    Basal cell carcinoma 10/20/2022   R lat neck, EDC   Cancer (HCC)    Chronic kidney disease    renal cell carcinoma   Coronary artery disease    GERD (gastroesophageal reflux disease)    History of colon polyps    History of gastric polyp    History of hiatal hernia    Neuropathy    Non Hodgkin's lymphoma (HCC)    PONV (postoperative nausea and vomiting)     difficulty waking up after surgery   Reflux    Renal mass     Medications:  No home anticoagulants per pharmacist review  Assessment: 78 yo male presented to the ED from cardiologist office due to hypotension and chest pain.  Pharmacy consulted to initiate heparin  infusion.  Baseline labs ordered  Goal of Therapy:  Heparin  level 0.3-0.7 units/ml Monitor platelets by anticoagulation protocol: Yes   Plan:  7/1:  HL @ 0125 = 0.44, therapeutic X 1 - Will continue pt on current rate and recheck HL in 8 hrs - CBC daily   Vandella Ord D, PharmD 09/06/2023,2:11 AM

## 2023-09-06 NOTE — Discharge Summary (Addendum)
  Physician Discharge Summary   Patient: Raymond Bennett. MRN: [id number] DOB: 10-09-1945  Admit date:     09/05/2023  Discharge date: Pt left AMA  Discharge Physician: Leita Blanch   PCP: Corlis Honor BROCKS, MD    [name]. is a 78 y.o. male with medical history significant of CAD s/p PCI in April 2025 and GERD came to ED with progressively worsening chest pain which normally occurs whenever he started getting out of bed and walking.  Patient was admitted to the hospital with unstable angina and started on on IV heparin  drip. He was evaluated by [facility] cardiology. Echo showed EF of 35 to 40% which is decreased from EF of 50% in September 2024. Patient currently is chest pain free. He is scheduled to get cardiac cath tomorrow with Dr. Katina from due cardiology. Patient is not wanting to stay another night and would like to leave AMA. Patient recommended to follow-up with [facility] cardiology for further evaluation. Both patient and wife voiced understanding.  25 mins

## 2023-09-07 ENCOUNTER — Encounter
Admission: RE | Disposition: A | Discharge status: Other Acute Inpatient Hospital | Payer: Self-pay | Source: Ambulatory Visit | Attending: Emergency Medicine

## 2023-09-07 ENCOUNTER — Other Ambulatory Visit: Payer: Self-pay

## 2023-09-07 ENCOUNTER — Inpatient Hospital Stay

## 2023-09-07 ENCOUNTER — Inpatient Hospital Stay
Admission: RE | Admit: 2023-09-07 | Discharge: 2023-09-08 | DRG: 286 | Disposition: A | Discharge status: Other Acute Inpatient Hospital | Attending: Internal Medicine | Admitting: Internal Medicine

## 2023-09-07 DIAGNOSIS — E785 Hyperlipidemia, unspecified: Secondary | ICD-10-CM | POA: Diagnosis present

## 2023-09-07 DIAGNOSIS — Z7984 Long term (current) use of oral hypoglycemic drugs: Secondary | ICD-10-CM

## 2023-09-07 DIAGNOSIS — Z85528 Personal history of other malignant neoplasm of kidney: Secondary | ICD-10-CM

## 2023-09-07 DIAGNOSIS — N1831 Chronic kidney disease, stage 3a: Secondary | ICD-10-CM | POA: Diagnosis not present

## 2023-09-07 DIAGNOSIS — I130 Hypertensive heart and chronic kidney disease with heart failure and stage 1 through stage 4 chronic kidney disease, or unspecified chronic kidney disease: Secondary | ICD-10-CM | POA: Diagnosis present

## 2023-09-07 DIAGNOSIS — Z85828 Personal history of other malignant neoplasm of skin: Secondary | ICD-10-CM

## 2023-09-07 DIAGNOSIS — I10 Essential (primary) hypertension: Secondary | ICD-10-CM | POA: Diagnosis not present

## 2023-09-07 DIAGNOSIS — N179 Acute kidney failure, unspecified: Secondary | ICD-10-CM | POA: Diagnosis present

## 2023-09-07 DIAGNOSIS — Z955 Presence of coronary angioplasty implant and graft: Secondary | ICD-10-CM

## 2023-09-07 DIAGNOSIS — I6350 Cerebral infarction due to unspecified occlusion or stenosis of unspecified cerebral artery: Secondary | ICD-10-CM | POA: Diagnosis not present

## 2023-09-07 DIAGNOSIS — Z7902 Long term (current) use of antithrombotics/antiplatelets: Secondary | ICD-10-CM

## 2023-09-07 DIAGNOSIS — Z91014 Allergy to mammalian meats: Secondary | ICD-10-CM | POA: Diagnosis not present

## 2023-09-07 DIAGNOSIS — R55 Syncope and collapse: Secondary | ICD-10-CM | POA: Diagnosis not present

## 2023-09-07 DIAGNOSIS — Z905 Acquired absence of kidney: Secondary | ICD-10-CM

## 2023-09-07 DIAGNOSIS — N1832 Chronic kidney disease, stage 3b: Secondary | ICD-10-CM | POA: Diagnosis present

## 2023-09-07 DIAGNOSIS — I429 Cardiomyopathy, unspecified: Secondary | ICD-10-CM | POA: Diagnosis present

## 2023-09-07 DIAGNOSIS — Z8572 Personal history of non-Hodgkin lymphomas: Secondary | ICD-10-CM

## 2023-09-07 DIAGNOSIS — I25110 Atherosclerotic heart disease of native coronary artery with unstable angina pectoris: Principal | ICD-10-CM | POA: Diagnosis present

## 2023-09-07 DIAGNOSIS — Z7982 Long term (current) use of aspirin: Secondary | ICD-10-CM

## 2023-09-07 DIAGNOSIS — Z860100 Personal history of colon polyps, unspecified: Secondary | ICD-10-CM | POA: Diagnosis not present

## 2023-09-07 DIAGNOSIS — Z604 Social exclusion and rejection: Secondary | ICD-10-CM | POA: Diagnosis present

## 2023-09-07 DIAGNOSIS — Z79899 Other long term (current) drug therapy: Secondary | ICD-10-CM | POA: Diagnosis not present

## 2023-09-07 DIAGNOSIS — I2510 Atherosclerotic heart disease of native coronary artery without angina pectoris: Secondary | ICD-10-CM | POA: Diagnosis present

## 2023-09-07 DIAGNOSIS — H539 Unspecified visual disturbance: Secondary | ICD-10-CM | POA: Diagnosis not present

## 2023-09-07 DIAGNOSIS — I5020 Unspecified systolic (congestive) heart failure: Secondary | ICD-10-CM | POA: Diagnosis present

## 2023-09-07 DIAGNOSIS — I200 Unstable angina: Principal | ICD-10-CM | POA: Diagnosis present

## 2023-09-07 DIAGNOSIS — Z950 Presence of cardiac pacemaker: Secondary | ICD-10-CM

## 2023-09-07 HISTORY — PX: LEFT HEART CATH AND CORONARY ANGIOGRAPHY: CATH118249

## 2023-09-07 LAB — COMPREHENSIVE METABOLIC PANEL WITH GFR
ALT: 11 U/L (ref 0–44)
AST: 26 U/L (ref 15–41)
Albumin: 3.4 g/dL — ABNORMAL LOW (ref 3.5–5.0)
Alkaline Phosphatase: 43 U/L (ref 38–126)
Anion gap: 13 (ref 5–15)
BUN: 39 mg/dL — ABNORMAL HIGH (ref 8–23)
CO2: 16 mmol/L — ABNORMAL LOW (ref 22–32)
Calcium: 8.7 mg/dL — ABNORMAL LOW (ref 8.9–10.3)
Chloride: 106 mmol/L (ref 98–111)
Creatinine, Ser: 1.58 mg/dL — ABNORMAL HIGH (ref 0.61–1.24)
GFR, Estimated: 44 mL/min — ABNORMAL LOW (ref >60)
Glucose, Bld: 118 mg/dL — ABNORMAL HIGH (ref 70–99)
Potassium: 4.8 mmol/L (ref 3.5–5.1)
Sodium: 135 mmol/L (ref 135–145)
Total Bilirubin: 0.7 mg/dL (ref 0.0–1.2)
Total Protein: 6.3 g/dL — ABNORMAL LOW (ref 6.5–8.1)

## 2023-09-07 LAB — CBC WITH DIFFERENTIAL/PLATELET
Abs Immature Granulocytes: 0.06 10*3/uL (ref 0.00–0.07)
Basophils Absolute: 0.1 10*3/uL (ref 0.0–0.1)
Basophils Relative: 1 %
Eosinophils Absolute: 0.1 10*3/uL (ref 0.0–0.5)
Eosinophils Relative: 1 %
HCT: 30.6 % — ABNORMAL LOW (ref 39.0–52.0)
Hemoglobin: 9.9 g/dL — ABNORMAL LOW (ref 13.0–17.0)
Immature Granulocytes: 1 %
Lymphocytes Relative: 41 %
Lymphs Abs: 5.1 10*3/uL — ABNORMAL HIGH (ref 0.7–4.0)
MCH: 32.1 pg (ref 26.0–34.0)
MCHC: 32.4 g/dL (ref 30.0–36.0)
MCV: 99.4 fL (ref 80.0–100.0)
Monocytes Absolute: 0.9 10*3/uL (ref 0.1–1.0)
Monocytes Relative: 7 %
Neutro Abs: 6.4 10*3/uL (ref 1.7–7.7)
Neutrophils Relative %: 49 %
Platelets: 105 10*3/uL — ABNORMAL LOW (ref 150–400)
RBC: 3.08 MIL/uL — ABNORMAL LOW (ref 4.22–5.81)
RDW: 13.9 % (ref 11.5–15.5)
WBC: 12.6 10*3/uL — ABNORMAL HIGH (ref 4.0–10.5)
nRBC: 0 % (ref 0.0–0.2)

## 2023-09-07 LAB — MAGNESIUM: Magnesium: 2.1 mg/dL (ref 1.7–2.4)

## 2023-09-07 LAB — BLOOD GAS, VENOUS
Acid-base deficit: 3.9 mmol/L — ABNORMAL HIGH (ref 0.0–2.0)
Bicarbonate: 22.2 mmol/L (ref 20.0–28.0)
O2 Saturation: 83.5 %
Patient temperature: 37
pCO2, Ven: 43 mmHg — ABNORMAL LOW (ref 44–60)
pH, Ven: 7.32 (ref 7.25–7.43)
pO2, Ven: 55 mmHg — ABNORMAL HIGH (ref 32–45)

## 2023-09-07 LAB — GLUCOSE, CAPILLARY: Glucose-Capillary: 118 mg/dL — ABNORMAL HIGH (ref 70–99)

## 2023-09-07 LAB — PROCALCITONIN: Procalcitonin: <0.1 ng/mL

## 2023-09-07 LAB — T4, FREE: Free T4: 0.73 ng/dL (ref 0.61–1.12)

## 2023-09-07 LAB — TSH: TSH: 5.16 u[IU]/mL — ABNORMAL HIGH (ref 0.350–4.500)

## 2023-09-07 LAB — TROPONIN I (HIGH SENSITIVITY)
Troponin I (High Sensitivity): 35 ng/L — ABNORMAL HIGH (ref <18)
Troponin I (High Sensitivity): 30 ng/L — ABNORMAL HIGH (ref <18)

## 2023-09-07 LAB — PHOSPHORUS: Phosphorus: 3.3 mg/dL (ref 2.5–4.6)

## 2023-09-07 LAB — BRAIN NATRIURETIC PEPTIDE: B Natriuretic Peptide: 211 pg/mL — ABNORMAL HIGH (ref 0.0–100.0)

## 2023-09-07 SURGERY — LEFT HEART CATH AND CORONARY ANGIOGRAPHY
Anesthesia: Moderate Sedation

## 2023-09-07 MED ORDER — SODIUM CHLORIDE 0.9 % IV SOLN: 250.0000 mL | INTRAVENOUS | Status: DC | PRN

## 2023-09-07 MED ORDER — ACETAMINOPHEN 325 MG PO TABS: 650.0000 mg | ORAL_TABLET | Freq: Four times a day (QID) | ORAL | Status: DC | PRN

## 2023-09-07 MED ORDER — ASPIRIN 81 MG PO TBEC
81.0000 mg | DELAYED_RELEASE_TABLET | Freq: Every day | ORAL | Status: DC
  Administered 2023-09-08: 81 mg via ORAL
  Filled 2023-09-07: qty 1

## 2023-09-07 MED ORDER — HEPARIN (PORCINE) IN NACL 1000-0.9 UT/500ML-% IV SOLN
INTRAVENOUS | Status: AC
  Filled 2023-09-07: qty 1000

## 2023-09-07 MED ORDER — SODIUM CHLORIDE 0.9% FLUSH: 3.0000 mL | INTRAVENOUS | Status: DC | PRN

## 2023-09-07 MED ORDER — SODIUM CHLORIDE 0.9 % IV SOLN: INTRAVENOUS | Status: DC

## 2023-09-07 MED ORDER — MAGNESIUM OXIDE 400 MG PO TABS
400.0000 mg | ORAL_TABLET | Freq: Every day | ORAL | Status: DC
  Administered 2023-09-08: 400 mg via ORAL
  Filled 2023-09-07 (×2): qty 1

## 2023-09-07 MED ORDER — VERAPAMIL HCL 2.5 MG/ML IV SOLN
INTRAVENOUS | Status: DC | PRN
  Administered 2023-09-07: 2.5 mg via INTRA_ARTERIAL

## 2023-09-07 MED ORDER — SENNOSIDES-DOCUSATE SODIUM 8.6-50 MG PO TABS: 1.0000 | ORAL_TABLET | Freq: Every evening | ORAL | Status: DC | PRN

## 2023-09-07 MED ORDER — ACETAMINOPHEN 325 MG PO TABS: 650.0000 mg | ORAL_TABLET | ORAL | Status: DC | PRN

## 2023-09-07 MED ORDER — SODIUM CHLORIDE 0.9% FLUSH
3.0000 mL | Freq: Two times a day (BID) | INTRAVENOUS | Status: DC
  Administered 2023-09-07 – 2023-09-08 (×2): 3 mL via INTRAVENOUS

## 2023-09-07 MED ORDER — SODIUM CHLORIDE 0.9% FLUSH: 3.0000 mL | Freq: Two times a day (BID) | INTRAVENOUS | Status: DC

## 2023-09-07 MED ORDER — MIDAZOLAM HCL 2 MG/2ML IJ SOLN
INTRAMUSCULAR | Status: DC | PRN
  Administered 2023-09-07: 1 mg via INTRAVENOUS

## 2023-09-07 MED ORDER — IOHEXOL 300 MG/ML  SOLN
INTRAMUSCULAR | Status: DC | PRN
  Administered 2023-09-07: 36 mL

## 2023-09-07 MED ORDER — SPIRONOLACTONE 25 MG PO TABS: 25.0000 mg | ORAL_TABLET | Freq: Every day | ORAL | Status: DC

## 2023-09-07 MED ORDER — MIDAZOLAM HCL 2 MG/2ML IJ SOLN
INTRAMUSCULAR | Status: AC
Start: 2023-09-07 — End: 2023-09-07
  Filled 2023-09-07: qty 2

## 2023-09-07 MED ORDER — ONDANSETRON HCL 4 MG/2ML IJ SOLN: 4.0000 mg | Freq: Four times a day (QID) | INTRAMUSCULAR | Status: DC | PRN

## 2023-09-07 MED ORDER — LABETALOL HCL 5 MG/ML IV SOLN: 10.0000 mg | INTRAVENOUS | Status: AC | PRN

## 2023-09-07 MED ORDER — NITROGLYCERIN 1 MG/10 ML FOR IR/CATH LAB
INTRA_ARTERIAL | Status: AC
  Filled 2023-09-07: qty 10

## 2023-09-07 MED ORDER — ALUM & MAG HYDROXIDE-SIMETH 200-200-20 MG/5ML PO SUSP
ORAL | Status: AC
  Filled 2023-09-07: qty 30

## 2023-09-07 MED ORDER — HYDRALAZINE HCL 20 MG/ML IJ SOLN: 10.0000 mg | INTRAMUSCULAR | Status: AC | PRN

## 2023-09-07 MED ORDER — HEPARIN (PORCINE) 25000 UT/250ML-% IV SOLN
1050.0000 [IU]/h | INTRAVENOUS | Status: DC
  Administered 2023-09-07: 1000 [IU]/h via INTRAVENOUS
  Filled 2023-09-07: qty 250

## 2023-09-07 MED ORDER — FENTANYL CITRATE (PF) 100 MCG/2ML IJ SOLN
INTRAMUSCULAR | Status: AC
  Filled 2023-09-07: qty 2

## 2023-09-07 MED ORDER — LISINOPRIL 10 MG PO TABS
10.0000 mg | ORAL_TABLET | Freq: Every day | ORAL | Status: DC
  Administered 2023-09-07: 10 mg via ORAL
  Filled 2023-09-07: qty 1

## 2023-09-07 MED ORDER — HEPARIN SODIUM (PORCINE) 1000 UNIT/ML IJ SOLN
INTRAMUSCULAR | Status: AC
Start: 2023-09-07 — End: 2023-09-07
  Filled 2023-09-07: qty 10

## 2023-09-07 MED ORDER — NITROGLYCERIN IN D5W 200-5 MCG/ML-% IV SOLN
0.0000 ug/min | INTRAVENOUS | Status: DC
  Administered 2023-09-07: 10 ug/min via INTRAVENOUS
  Administered 2023-09-07: 15 ug/min via INTRAVENOUS
  Administered 2023-09-07: 10 ug/min via INTRAVENOUS
  Administered 2023-09-07: 5 ug/min via INTRAVENOUS
  Filled 2023-09-07: qty 250

## 2023-09-07 MED ORDER — ASPIRIN 81 MG PO CHEW
81.0000 mg | CHEWABLE_TABLET | ORAL | Status: AC
  Administered 2023-09-07: 81 mg via ORAL

## 2023-09-07 MED ORDER — FENTANYL CITRATE (PF) 100 MCG/2ML IJ SOLN
INTRAMUSCULAR | Status: DC | PRN
  Administered 2023-09-07: 50 ug via INTRAVENOUS

## 2023-09-07 MED ORDER — CLOPIDOGREL BISULFATE 75 MG PO TABS
75.0000 mg | ORAL_TABLET | Freq: Every day | ORAL | Status: DC
  Administered 2023-09-07 – 2023-09-08 (×2): 75 mg via ORAL
  Filled 2023-09-07 (×2): qty 1

## 2023-09-07 MED ORDER — HEPARIN (PORCINE) IN NACL 1000-0.9 UT/500ML-% IV SOLN
INTRAVENOUS | Status: DC | PRN
  Administered 2023-09-07: 500 mL

## 2023-09-07 MED ORDER — SODIUM CHLORIDE 0.9 % WEIGHT BASED INFUSION
1.0000 mL/kg/h | INTRAVENOUS | Status: DC
Start: 2023-09-07 — End: 2023-09-07
  Administered 2023-09-07 (×2): 1 mL/kg/h via INTRAVENOUS

## 2023-09-07 MED ORDER — EMPAGLIFLOZIN 10 MG PO TABS
10.0000 mg | ORAL_TABLET | Freq: Every day | ORAL | Status: DC
  Administered 2023-09-07 – 2023-09-08 (×2): 10 mg via ORAL
  Filled 2023-09-07 (×3): qty 1

## 2023-09-07 MED ORDER — LIDOCAINE HCL (PF) 1 % IJ SOLN
INTRAMUSCULAR | Status: DC | PRN
  Administered 2023-09-07: 2 mL

## 2023-09-07 MED ORDER — ALUM & MAG HYDROXIDE-SIMETH 200-200-20 MG/5ML PO SUSP
30.0000 mL | Freq: Once | ORAL | Status: AC
  Administered 2023-09-07: 30 mL via ORAL

## 2023-09-07 MED ORDER — ACETAMINOPHEN 650 MG RE SUPP: 650.0000 mg | Freq: Four times a day (QID) | RECTAL | Status: DC | PRN

## 2023-09-07 MED ORDER — SODIUM CHLORIDE 0.9 % IV SOLN
INTRAVENOUS | Status: DC | PRN
  Administered 2023-09-07: 999 mL/h via INTRAVENOUS

## 2023-09-07 MED ORDER — HEPARIN SODIUM (PORCINE) 1000 UNIT/ML IJ SOLN
INTRAMUSCULAR | Status: DC | PRN
  Administered 2023-09-07: 5000 [IU] via INTRAVENOUS

## 2023-09-07 MED ORDER — ASPIRIN 81 MG PO CHEW
CHEWABLE_TABLET | ORAL | Status: AC
Start: 2023-09-07 — End: 2023-09-07
  Filled 2023-09-07: qty 1

## 2023-09-07 MED ORDER — VERAPAMIL HCL 2.5 MG/ML IV SOLN
INTRAVENOUS | Status: AC
  Filled 2023-09-07: qty 2

## 2023-09-07 MED ORDER — ROSUVASTATIN CALCIUM 20 MG PO TABS
20.0000 mg | ORAL_TABLET | Freq: Every day | ORAL | Status: DC
  Administered 2023-09-07 – 2023-09-08 (×2): 20 mg via ORAL
  Filled 2023-09-07 (×2): qty 1
  Filled 2023-09-07 (×2): qty 2
  Filled 2023-09-07: qty 1

## 2023-09-07 MED ORDER — METOPROLOL TARTRATE 25 MG PO TABS
12.5000 mg | ORAL_TABLET | Freq: Two times a day (BID) | ORAL | Status: DC
  Administered 2023-09-07 – 2023-09-08 (×2): 12.5 mg via ORAL
  Filled 2023-09-07 (×3): qty 1

## 2023-09-07 MED ORDER — PANTOPRAZOLE SODIUM 40 MG PO TBEC
40.0000 mg | DELAYED_RELEASE_TABLET | Freq: Every day | ORAL | Status: DC
  Administered 2023-09-07 – 2023-09-08 (×2): 40 mg via ORAL
  Filled 2023-09-07 (×2): qty 1

## 2023-09-07 MED ORDER — LIDOCAINE HCL 1 % IJ SOLN
INTRAMUSCULAR | Status: AC
  Filled 2023-09-07: qty 20

## 2023-09-07 MED ORDER — NITROGLYCERIN 1 MG/10 ML FOR IR/CATH LAB
INTRA_ARTERIAL | Status: DC | PRN
  Administered 2023-09-07: 200 ug via INTRA_ARTERIAL

## 2023-09-07 SURGICAL SUPPLY — 9 items
CATH INFINITI 5 FR JL3.5 (CATHETERS) IMPLANT
CATH INFINITI JR4 5F (CATHETERS) IMPLANT
DEVICE RAD TR BAND REGULAR (VASCULAR PRODUCTS) IMPLANT
DRAPE BRACHIAL (DRAPES) IMPLANT
GLIDESHEATH SLEND A-KIT 6F 22G (SHEATH) IMPLANT
GUIDEWIRE INQWIRE 1.5J.035X260 (WIRE) IMPLANT
PACK CARDIAC CATH (CUSTOM PROCEDURE TRAY) ×1 IMPLANT
SET ATX-X65L (MISCELLANEOUS) IMPLANT
STATION PROTECTION PRESSURIZED (MISCELLANEOUS) IMPLANT

## 2023-09-07 NOTE — Plan of Care (Signed)

## 2023-09-07 NOTE — Progress Notes (Signed)
 Pt req to walk to bathroom for bowel movement. After getting near bathroom pt started to feel dizzy and light headed. This RN and pt wife assisted pt to bedside commode. Pt appeared to vagal down turning pale, diaphoretic with weak thready pulse and abnormal respirations. Rapid response called. Pt assisted back to bed. Pulse lost during this time and compressions started at 1936. Pulse returned 1937. MD, NP and code team present at bedside. Cardiac work-up performed. Will continue to monitor pt closely.

## 2023-09-07 NOTE — Significant Event (Incomplete)
       CROSS COVER NOTE  NAME: Raymond Bennett. MRN: [id number] DOB : 03-08-1946 ATTENDING PHYSICIAN: Collie Daved BROCKS, DO    Date of Service   09/07/2023   HPI/Events of Note   Rapid response initiated when patient became syncopal, per nurse lost pulse for few seconds and received a couple of compression before pulse returned.   HPI Patient with Raymond Bennett. is a 78 y.o. male with medical history significant of CAD s/p PCI in April 2025 presented initially to the emergency department on 6/30 for evaluation of exertional, left sided chest pain that has been worsening for the past 3 weeks. He was admitted to the hospitalist service with unstable angina and started on on IV heparin  drip at that time. He was evaluated by [facility] cardiology. Echo showed EF of 35 to 40% which is decreased from EF of 50% in September 2024.  He was initially scheduled for a left heart cath the following day (7/1), however he left AGAINST MEDICAL ADVICE. Catheterization arranged for today as outpatient.   Patient underwent outpatient LHC on 07/02 with Dr. Katina that revealed severe one-vessel CAD involving ostial LAD with prior LAD/D1 stent widely patient. Patient re-admitted under [facility] hospitalist service and will be transferred to [facility] this weekend for high risk PCI scheduled for Monday with Dr. Joshua.   Interventions   Assessment/Plan:    09/07/2023    6:45 PM 09/07/2023    6:30 PM 09/07/2023    6:15 PM  Vitals with BMI  Systolic 109 113 881  Diastolic 75 66 61  Pulse 55 82 82    X X X

## 2023-09-07 NOTE — H&P (Signed)
 History and Physical    Patient: Raymond Bennett. FMW:[id number] DOB: March 14, 1945 DOA: 09/07/2023 DOS: the patient was seen and examined on 09/07/2023 PCP: Corlis Honor BROCKS, MD  Patient coming from: Home  Chief Complaint: No chief complaint on file.  HPI: Raymond Bennett. is a 78 y.o. male with medical history significant of CAD s/p PCI in April 2025 presented initially to the emergency department on 6/30 for evaluation of exertional, left sided chest pain that has been worsening for the past 3 weeks. He was admitted to the hospitalist service with unstable angina and started on on IV heparin  drip at that time. He was evaluated by [facility] cardiology. Echo showed EF of 35 to 40% which is decreased from EF of 50% in September 2024.  He was initially scheduled for a left heart cath the following day (7/1), however he left AGAINST MEDICAL ADVICE. Catheterization arranged for today as outpatient.   Patient underwent outpatient LHC on 07/02 with Dr. Katina that revealed severe one-vessel CAD involving ostial LAD with prior LAD/D1 stent widely patient. Patient re-admitted under [facility] hospitalist service and will be transferred to [facility] this weekend for high risk PCI scheduled for Monday with Dr. Joshua.     Review of Systems:  Review of Systems  Constitutional:  Negative for chills, fever, malaise/fatigue and weight loss.  Respiratory:  Positive for cough. Negative for hemoptysis, sputum production, shortness of breath and wheezing.   Cardiovascular:  Positive for chest pain. Negative for palpitations, claudication and leg swelling.  Gastrointestinal:  Negative for abdominal pain, constipation, diarrhea, nausea and vomiting.  Genitourinary:  Negative for dysuria and frequency.  Musculoskeletal:  Negative for neck pain.  Skin:  Negative for itching and rash.  Neurological:  Negative for dizziness, tingling, focal weakness and weakness.  Psychiatric/Behavioral:  Negative for suicidal ideas. The patient is not  nervous/anxious.     Past Medical History:  Diagnosis Date   Basal cell carcinoma 01/17/2014   L lateral top of shoulder ant, L ant top of shoulder post   Basal cell carcinoma 01/01/2016   L mid back   Basal cell carcinoma 11/17/2017   R mid ear helix   Basal cell carcinoma 02/06/2019   L scalp/temple area within the hair above the sideburn/excision   Basal cell carcinoma 08/15/2019   Right chest medial infraclavicular. Superficial and nodular.    Basal cell carcinoma 08/15/2019   Left chest parasternal. Superficial.   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   CHOLECYSTECTOMY     COLONOSCOPY WITH PROPOFOL  N/A 02/11/2017   Procedure: COLONOSCOPY WITH PROPOFOL ;  Surgeon: Viktoria Lamar DASEN, MD;  Location[facility] ENDOSCOPY;  Service: Endoscopy;  Laterality: N/A;   CORONARY STENT INTERVENTION N/A 06/22/2023   Procedure: CORONARY STENT  INTERVENTION;  Surgeon: Katina Albright, MD;  Location: [facility] INVASIVE CV LAB;  Service: Cardiovascular;  Laterality: N/A;   CORONARY ULTRASOUND/IVUS N/A 06/22/2023   Procedure: Coronary Ultrasound/IVUS;  Surgeon: Katina Albright, MD;  Location: [facility] INVASIVE CV LAB;  Service: Cardiovascular;  Laterality: N/A;   ESOPHAGOGASTRODUODENOSCOPY (EGD) WITH PROPOFOL  N/A 02/11/2017   Procedure: ESOPHAGOGASTRODUODENOSCOPY (EGD) WITH PROPOFOL ;  Surgeon: Viktoria Lamar DASEN, MD;  Location: [facility] ENDOSCOPY;  Service:  Endoscopy;  Laterality: N/A;   FRACTURE SURGERY     GALLBLADDER SURGERY     HERNIA REPAIR Left    Inguinal Hernia Repair   LEFT HEART CATH AND CORONARY ANGIOGRAPHY Left 06/22/2023   Procedure: LEFT HEART CATH AND CORONARY ANGIOGRAPHY;  Surgeon: Katina Albright, MD;  Location: [facility] INVASIVE CV LAB;  Service: Cardiovascular;  Laterality: Left;   PACEMAKER LEADLESS INSERTION N/A 11/02/2022   Procedure: PACEMAKER LEADLESS INSERTION;  Surgeon: Ammon Blunt, MD;  Location: [facility] INVASIVE CV LAB;  Service: Cardiovascular;  Laterality: N/A;   ROBOTIC ASSITED PARTIAL NEPHRECTOMY Left 11/12/2015   Procedure: ROBOTIC ASSITED PARTIAL NEPHRECTOMY;  Surgeon: Redell Lynwood Napoleon, MD;  Location: [facility] ORS;  Service: Urology;  Laterality: Left;   TONSILLECTOMY     Social History:  reports that he has never smoked. He has never used smokeless tobacco. He reports that he does not drink alcohol and does not use drugs.  Allergies  Allergen Reactions   Beef-Derived Drug Products Nausea And Vomiting    History reviewed. No pertinent family history.  Prior to Admission medications   Medication Sig Start Date End Date Taking? Authorizing Provider  Cholecalciferol 25 MCG (1000 UT) tablet Take 1,000 Units by mouth daily.   Yes [provider]  ferrous sulfate 325 (65 FE) MG tablet Take 325 mg by mouth in the morning.   Yes [provider]  magnesium  oxide (MAG-OX) 400 MG tablet Take 400 mg by mouth daily.   Yes [provider]  nitroGLYCERIN  (NITROSTAT ) 0.4 MG SL tablet Place 0.4 mg under the tongue every 5 (five) minutes x 3 doses as needed for chest pain. 06/14/23 06/13/24 Yes [provider]  aspirin  EC 81 MG tablet Take 1 tablet (81 mg total) by mouth daily. Swallow whole. 06/22/23   Hudson, Caralyn, PA-C  clopidogrel  (PLAVIX ) 75 MG tablet Take 1 tablet (75 mg total) by mouth daily. 06/22/23   Hudson, Caralyn, PA-C  colchicine 0.6 MG tablet Take 0.6 mg by mouth as needed (gout  flare). Patient not taking: Reported on 09/07/2023 08/15/23   [provider]  empagliflozin (JARDIANCE) 10 MG TABS tablet Take 1 tablet by mouth daily. 01/04/23 01/04/24  [provider]  finasteride (PROPECIA) 1 MG tablet Take 1 tablet by mouth every morning. 03/15/18   [provider]  isosorbide mononitrate (IMDUR) 30 MG 24 hr tablet Take 30 mg by mouth daily. 08/24/23 08/23/24  [provider]  lisinopril (ZESTRIL) 20 MG tablet Take 10 mg by mouth daily. 06/29/23 06/28/24  [provider]  metoprolol tartrate (LOPRESSOR) 25 MG tablet Take 12.5 mg by mouth 2 (two) times daily. 12/28/22 12/28/23  [provider]  Multiple Vitamins-Minerals (PRESERVISION AREDS 2) CAPS Take 1 capsule by mouth 2 (two) times daily.    [provider]  omeprazole (PRILOSEC) 40 MG capsule Take 40 mg by mouth daily before breakfast. Reported on 08/06/2015 10/29/13   [provider]  Probiotic Product (PROBIOTIC PO) Take 1 capsule by mouth daily.    [provider]  rosuvastatin (CRESTOR) 20 MG tablet  Take 20 mg by mouth daily. 06/29/23 06/28/24  [provider]  spironolactone (ALDACTONE) 25 MG tablet Take 25 mg by mouth daily. 01/04/23 01/04/24  [provider]  vitamin B-12 (CYANOCOBALAMIN) 1000 MCG tablet Take 1,000 mcg by mouth in the morning.    [provider]    Physical Exam: Vitals:   09/07/23 1140 09/07/23 1200 09/07/23 1215 09/07/23 1230  BP: 105/73 106/64 111/65 117/64  Pulse: 92 83 81 91  Resp: 19 19 13  (!) 21  Temp:  (!) 97.5 F (36.4 C)    TempSrc:  Temporal    SpO2:  96% 100% 100%  Weight:      Height:      Physical Exam Vitals and nursing note reviewed.  Constitutional:      General: He is not in acute distress.    Appearance: He is normal weight. He is not toxic-appearing.  HENT:     Head: Normocephalic and atraumatic.     Mouth/Throat:     Mouth: Mucous membranes are moist.  Eyes:      Extraocular Movements: Extraocular movements intact.     Pupils: Pupils are equal, round, and reactive to light.  Cardiovascular:     Rate and Rhythm: Normal rate and regular rhythm.  Pulmonary:     Effort: Pulmonary effort is normal. No respiratory distress.     Breath sounds: Normal breath sounds.  Abdominal:     General: Bowel sounds are normal. There is no distension.     Palpations: Abdomen is soft.  Musculoskeletal:        General: No swelling.     Cervical back: Neck supple.     Right lower leg: No edema.     Left lower leg: No edema.  Skin:    General: Skin is warm and dry.  Neurological:     General: No focal deficit present.     Mental Status: He is alert and oriented to person, place, and time.     Cranial Nerves: No cranial nerve deficit.  Psychiatric:        Mood and Affect: Mood normal.        Behavior: Behavior normal.     Data Reviewed:    Labs on Admission: I have personally reviewed following labs and imaging studies  CBC: Recent Labs  Lab 09/05/23 1610 09/05/23 1819 09/06/23 0601  WBC 8.6 9.2 10.8*  HGB 9.7* 9.8* 11.5*  HCT 29.2* 29.3* 35.5*  MCV 98.6 98.0 100.3*  PLT 177 167 201   Basic Metabolic Panel: Recent Labs  Lab 09/05/23 1507 09/05/23 1529 09/06/23 0601  NA 133*  --  135  K 4.7  --  5.4*  CL 103  --  104  CO2 20*  --  22  GLUCOSE 125*  --  99  BUN 38*  --  40*  CREATININE 1.92* 2.04* 1.74*  CALCIUM 9.2  --  9.4   GFR: Estimated Creatinine Clearance: 33.3 mL/min (A) (by C-G formula based on SCr of 1.74 mg/dL (H)). Liver Function Tests: No results for input(s): AST, ALT, ALKPHOS, BILITOT, PROT, ALBUMIN in the last 168 hours. No results for input(s): LIPASE, AMYLASE in the last 168 hours. No results for input(s): AMMONIA in the last 168 hours. Coagulation Profile: Recent Labs  Lab 09/05/23 1610  INR 1.0   Cardiac Enzymes: No results for input(s): CKTOTAL, CKMB, CKMBINDEX, TROPONINI in the last  168 hours. BNP (last 3 results) No results for input(s): PROBNP in the last 8760  hours. HbA1C: No results for input(s): HGBA1C in the last 72 hours. CBG: No results for input(s): GLUCAP in the last 168 hours. Lipid Profile: Recent Labs    09/05/23 1610  CHOL 90  HDL 25*  LDLCALC 31  TRIG 829*  CHOLHDL 3.6   Thyroid Function Tests: No results for input(s): TSH, T4TOTAL, FREET4, T3FREE, THYROIDAB in the last 72 hours. Anemia Panel: Recent Labs    09/05/23 1529 09/05/23 1819  VITAMINB12  --  489  FOLATE 24.0  --   FERRITIN 199  --   TIBC 251  --   IRON 92  --   RETICCTPCT  --  1.6   Urine analysis: No results found for: COLORURINE, APPEARANCEUR, LABSPEC, PHURINE, GLUCOSEU, HGBUR, BILIRUBINUR, KETONESUR, PROTEINUR, UROBILINOGEN, NITRITE, LEUKOCYTESUR  Radiological Exams on Admission: CARDIAC CATHETERIZATION Result Date: 09/07/2023   Ost LAD to Prox LAD lesion is 90% stenosed.   Non-stenotic Prox LAD to Mid LAD lesion was previously treated.   Non-stenotic 1st Diag lesion was previously treated.   Recommend dual antiplatelet therapy with Aspirin  81mg  daily and Clopidogrel  75mg  daily. 1.  Severe one-vessel CAD involving the ostial LAD 2.  Prior LAD/D1 stent widely patent 3.  Transfer to [facility] for high risk PCI   ECHOCARDIOGRAM COMPLETE Result Date: 09/06/2023    ECHOCARDIOGRAM REPORT   Patient Name:   Raymond Bennett. Date of Exam: 09/06/2023 Medical Rec #:  [id number]        Height:       68.0 in Accession #:    [id number]       Weight:       179.0 lb Date of Birth:  October 22, 1945         BSA:          1.950 m Patient Age:    77 years         BP:           108/64 mmHg Patient Gender: M                HR:           78 bpm. Exam Location:  [facility] Procedure: 2D Echo, Cardiac Doppler, Color Doppler and Intracardiac            Opacification Agent (Both Spectral and Color Flow Doppler were            utilized during procedure). Indications:     Chest Pain R07.9   History:         Patient has no prior history of Echocardiogram examinations.                  CAD and Previous Myocardial Infarction, Pacemaker;                  Signs/Symptoms:Chest Pain.  Sonographer:     Ashley McNeely-Sloane Referring Phys:  [id number] GABRIELLA DECOSTE Diagnosing Phys: Keller Alluri IMPRESSIONS  1. Left ventricular ejection fraction, by estimation, is 35 to 40%. The left ventricle has moderately decreased function. The left ventricle demonstrates regional wall motion abnormalities (see scoring diagram/findings for description). Left ventricular  diastolic parameters are consistent with Grade I diastolic dysfunction (impaired relaxation).  2. Right ventricular systolic function is normal. The right ventricular size is normal.  3. The mitral valve is normal in structure. No evidence of mitral valve regurgitation.  4. The aortic valve is tricuspid. Aortic valve regurgitation is not visualized.  5. The inferior vena cava is normal in size with  greater than 50% respiratory variability, suggesting right atrial pressure of 3 mmHg. FINDINGS  Left Ventricle: Left ventricular ejection fraction, by estimation, is 35 to 40%. The left ventricle has moderately decreased function. The left ventricle demonstrates regional wall motion abnormalities. Definity contrast agent was given IV to delineate the left ventricular endocardial borders. The left ventricular internal cavity size was normal in size. There is no left ventricular hypertrophy. Left ventricular diastolic parameters are consistent with Grade I diastolic dysfunction (impaired relaxation).  LV Wall Scoring: The apical septal segment is akinetic. The anterior septum, apical lateral segment, mid inferoseptal segment, apical anterior segment, apical inferior segment, and apex are hypokinetic. The anterior wall, antero-lateral wall, inferior wall, posterior wall, and basal inferoseptal segment are normal. Right Ventricle: The right ventricular size is  normal. No increase in right ventricular wall thickness. Right ventricular systolic function is normal. Left Atrium: Left atrial size was normal in size. Right Atrium: Right atrial size was normal in size. Pericardium: There is no evidence of pericardial effusion. Mitral Valve: The mitral valve is normal in structure. No evidence of mitral valve regurgitation. MV peak gradient, 8.2 mmHg. The mean mitral valve gradient is 3.0 mmHg. Tricuspid Valve: The tricuspid valve is normal in structure. Tricuspid valve regurgitation is trivial. Aortic Valve: The aortic valve is tricuspid. Aortic valve regurgitation is not visualized. Aortic valve mean gradient measures 2.0 mmHg. Aortic valve peak gradient measures 4.1 mmHg. Aortic valve area, by VTI measures 2.58 cm. Pulmonic Valve: The pulmonic valve was not well visualized. Pulmonic valve regurgitation is not visualized. Aorta: The aortic root and ascending aorta are structurally normal, with no evidence of dilitation. Venous: The inferior vena cava is normal in size with greater than 50% respiratory variability, suggesting right atrial pressure of 3 mmHg. IAS/Shunts: The atrial septum is grossly normal.  LEFT VENTRICLE PLAX 2D LVIDd:         4.70 cm     Diastology LVIDs:         3.20 cm     LV e' medial:    5.98 cm/s LV PW:         0.90 cm     LV E/e' medial:  10.0 LV IVS:        0.90 cm     LV e' lateral:   5.55 cm/s LVOT diam:     2.10 cm     LV E/e' lateral: 10.8 LV SV:         52 LV SV Index:   27 LVOT Area:     3.46 cm  LV Volumes (MOD) LV vol d, MOD A2C: 64.1 ml LV vol d, MOD A4C: 80.5 ml LV vol s, MOD A2C: 51.0 ml LV vol s, MOD A4C: 51.7 ml LV SV MOD A2C:     13.1 ml LV SV MOD A4C:     80.5 ml LV SV MOD BP:      22.7 ml RIGHT VENTRICLE             IVC RV Basal diam:  3.00 cm     IVC diam: 1.20 cm RV Mid diam:    2.70 cm RV S prime:     10.40 cm/s TAPSE (M-mode): 1.3 cm LEFT ATRIUM             Index        RIGHT ATRIUM          Index LA diam:        3.10 cm 1.59 cm/m  RA Area:     8.45 cm LA Vol (A2C):   36.2 ml 18.57 ml/m  RA Volume:   15.90 ml 8.16 ml/m LA Vol (A4C):   21.8 ml 11.18 ml/m LA Biplane Vol: 28.4 ml 14.57 ml/m  AORTIC VALVE                    PULMONIC VALVE AV Area (Vmax):    2.52 cm     PV Vmax:        0.99 m/s AV Area (Vmean):   2.49 cm     PV Vmean:       66.600 cm/s AV Area (VTI):     2.58 cm     PV VTI:         0.208 m AV Vmax:           101.00 cm/s  PV Peak grad:   3.9 mmHg AV Vmean:          69.900 cm/s  PV Mean grad:   2.0 mmHg AV VTI:            0.203 m      RVOT Peak grad: 2 mmHg AV Peak Grad:      4.1 mmHg AV Mean Grad:      2.0 mmHg LVOT Vmax:         73.40 cm/s LVOT Vmean:        50.300 cm/s LVOT VTI:          0.151 m LVOT/AV VTI ratio: 0.74  AORTA Ao Root diam: 3.00 cm Ao Asc diam:  3.10 cm MITRAL VALVE MV Area (PHT): 4.41 cm     SHUNTS MV Area VTI:   1.61 cm     Systemic VTI:  0.15 m MV Peak grad:  8.2 mmHg     Systemic Diam: 2.10 cm MV Mean grad:  3.0 mmHg     Pulmonic VTI:  0.134 m MV Vmax:       1.43 m/s MV Vmean:      73.0 cm/s MV Decel Time: 172 msec MV E velocity: 59.70 cm/s MV A velocity: 122.00 cm/s MV E/A ratio:  0.49 Keller Paterson Electronically signed by Keller Paterson Signature Date/Time: 09/06/2023/8:44:18 AM    Final    DG Chest 2 View Result Date: 09/05/2023 CLINICAL DATA:  Chest pain EXAM: CHEST - 2 VIEW COMPARISON:  03/01/2018 FINDINGS: No acute airspace disease or effusion. Stable cardiomediastinal silhouette with aortic atherosclerosis. Interval probable lead less pacemaker. No pneumothorax IMPRESSION: No active cardiopulmonary disease. Electronically Signed   By: Luke Bun M.D.   On: 09/05/2023 15:56       Assessment and Plan: No notes have been filed under this hospital service. 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 Of note they have meeting with attorney later this month Advanced directives   Consults: Cardiology  Family Communication: Wife at bedside   Severity of Illness: The appropriate patient status for this patient is INPATIENT. Inpatient status is judged to be reasonable and necessary in order to provide the required intensity of service to ensure the patient's safety. The patient's presenting symptoms, physical exam findings, and initial radiographic and laboratory data in the context of their chronic comorbidities is felt to place them at high risk for further clinical deterioration. Furthermore, it is not anticipated that the patient will be medically stable for discharge from the hospital within 2 midnights of admission.   * I certify that at the point of admission it is my clinical judgment that the patient will require inpatient hospital care spanning beyond 2 midnights from the point of admission due to high intensity of service, high risk for further deterioration and high frequency of surveillance required.*  Author: Daved JAYSON Pump, DO 09/07/2023 12:44 PM  For on call review [email or web address].

## 2023-09-07 NOTE — Consult Note (Signed)
 [facility] CLINIC CARDIOLOGY CONSULT NOTE       Patient ID: Raymond Bennett. MRN: [id number] DOB/AGE: 80/09/1945 79 y.o.  Admit date: 09/07/2023 Referring Physician Dr. Collie Primary Physician Corlis Honor BROCKS, MD Primary Cardiologist Dr. Fernanda. Pride Reason for Consultation chest pain  HPI: Raymond Rafter. is a 78 y.o. male  with a past medical history of coronary artery disease s/p DES to LAD/D1 bifurcation (06/22/2023 with Dr. 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 Patient seen by outpatient cardiology and sent to ED on 09/05/23 due to concerning/worsening anginal symptoms for past 3 weeks. Patient describes as burning chest pain that is relieved with nitroglycerin . Patient left hospital AMA, against medical advice on 07/01. Patient underwent outpatient LHC on 07/02 with Dr. Katina that revealed severe one-vessel CAD involving ostial LAD with prior LAD/D1 stent widely patient. Patient re-admitted under [facility] hospitalist service and will be transferred to [facility] this weekend for high risk PCI scheduled for Monday with Dr. Joshua.     Work up from 07/01 notable for na 135, K 5.4, cr 1.74, Hgb 11.5, plts 201, WBC 10.8. Lipids revealed LDL 31, TG 170, HDL 25. BNP 200. Patient resumed on heparin  gtt and home cardiac medications. Patient given 2x 500 ml of IVF due to elevated renal function.   At time of my evaluation this afternoon, patient states he feels okay and reports continuing to have chest pressure/burning with any exertion. Patient states he will not leave AMA and will stay the weekend. Patient denies any palpitations, SOB or dizziness. Left radial access site without significant tenderness, bruising or active bleeding.     Pertinent Cardiac History (Most recent) LHC with Dr. Katina 09/07/2023   Ost LAD to Prox LAD lesion is 90% stenosed.   Non-stenotic Prox LAD to Mid LAD lesion was previously treated.    Non-stenotic 1st Diag lesion was previously treated.   Recommend dual antiplatelet therapy with Aspirin  81mg  daily and Clopidogrel  75mg  daily.   1.  Severe one-vessel CAD involving the ostial LAD 2.  Prior LAD/D1 stent widely patent 3.  Transfer to [facility] for high risk PCI  LHC with Dr. Katina 06/22/2023   Prox LAD to Mid LAD lesion is 80% stenosed.   1st Diag lesion is 80% stenosed.   A stent was successfully placed.   A stent was successfully placed.   Post intervention, there is a 0% residual stenosis.   Post intervention, there is a 0% residual stenosis.   on 06/22/2023. 1.  Severe one-vessel CAD involving the LAD/D1 bifurcation 2.  Successful PTCA and stenting of the LAD/D1 bifurcation with a 2.75 x 22 mm Onyx drug-eluting stent in the LAD and a 2.25 x 8 mm Onyx drug-eluting stent in the diagonal branch with intravascular sound guidance and final kissing balloon inflation   Review of systems complete and found to be negative unless listed above    Past Medical History:  Diagnosis Date   Basal cell carcinoma 01/17/2014   L lateral top of shoulder ant, L ant top of shoulder post   Basal cell carcinoma 01/01/2016   L mid back   Basal cell carcinoma 11/17/2017   R mid ear helix   Basal cell carcinoma 02/06/2019   L scalp/temple area within the hair above the sideburn/excision   Basal cell carcinoma 08/15/2019   Right chest medial infraclavicular. Superficial and nodular.    Basal cell carcinoma 08/15/2019   Left chest parasternal. Superficial.  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   CHOLECYSTECTOMY     COLONOSCOPY WITH PROPOFOL  N/A 02/11/2017   Procedure: COLONOSCOPY WITH PROPOFOL ;  Surgeon: Viktoria Lamar DASEN, MD;  Location: [facility] ENDOSCOPY;  Service: Endoscopy;  Laterality: N/A;   CORONARY STENT INTERVENTION N/A 06/22/2023   Procedure: CORONARY STENT INTERVENTION;  Surgeon: Katina Albright, MD;  Location: [facility] INVASIVE CV LAB;  Service: Cardiovascular;  Laterality: N/A;   CORONARY ULTRASOUND/IVUS N/A 06/22/2023   Procedure: Coronary Ultrasound/IVUS;  Surgeon: Katina Albright, MD;  Location: [facility] INVASIVE CV LAB;  Service: Cardiovascular;  Laterality: N/A;   ESOPHAGOGASTRODUODENOSCOPY (EGD) WITH PROPOFOL  N/A 02/11/2017   Procedure: ESOPHAGOGASTRODUODENOSCOPY (EGD) WITH PROPOFOL ;  Surgeon: Viktoria Lamar DASEN, MD;  Location: [facility] ENDOSCOPY;  Service: Endoscopy;  Laterality: N/A;   FRACTURE SURGERY     GALLBLADDER SURGERY     HERNIA REPAIR Left    Inguinal Hernia Repair   LEFT HEART CATH AND CORONARY ANGIOGRAPHY Left 06/22/2023   Procedure: LEFT HEART CATH AND CORONARY ANGIOGRAPHY;  Surgeon: Katina Albright, MD;  Location: [facility] INVASIVE CV LAB;  Service: Cardiovascular;  Laterality: Left;   PACEMAKER LEADLESS INSERTION N/A 11/02/2022   Procedure: PACEMAKER LEADLESS INSERTION;  Surgeon: Ammon Blunt, MD;  Location: [facility] INVASIVE CV LAB;  Service: Cardiovascular;  Laterality: N/A;   ROBOTIC ASSITED PARTIAL NEPHRECTOMY Left 11/12/2015   Procedure: ROBOTIC ASSITED PARTIAL NEPHRECTOMY;  Surgeon: Redell Lynwood Napoleon, MD;  Location: [facility] ORS;  Service: Urology;  Laterality: Left;   TONSILLECTOMY      Medications Prior to Admission  Medication Sig Dispense Refill Last Dose/Taking   Cholecalciferol 25 MCG (1000 UT) tablet Take 1,000 Units by mouth daily.   09/05/2023 Morning   ferrous sulfate 325 (65 FE) MG  tablet Take 325 mg by mouth in the morning.   09/06/2023 Noon   magnesium  oxide (MAG-OX) 400 MG tablet Take 400 mg by mouth daily.   09/06/2023 Noon   nitroGLYCERIN  (NITROSTAT ) 0.4 MG SL tablet Place 0.4 mg under the tongue every 5 (five) minutes x 3 doses as needed for chest pain.   09/07/2023 at  2:00 AM   aspirin  EC 81 MG tablet Take 1 tablet (81 mg total) by mouth daily. Swallow whole. 30 tablet 0    clopidogrel  (PLAVIX ) 75 MG tablet Take 1 tablet (75 mg total) by mouth daily. 30 tablet 0 09/05/2023   colchicine 0.6 MG tablet Take 0.6 mg by mouth as needed (gout flare). (Patient not taking: Reported on 09/07/2023)   Not Taking   empagliflozin (JARDIANCE) 10 MG TABS tablet Take 1 tablet by mouth daily.   09/05/2023   finasteride (PROPECIA) 1 MG tablet Take 1 tablet by mouth every morning.   09/05/2023   isosorbide mononitrate (IMDUR) 30 MG 24 hr tablet  Take 30 mg by mouth daily.   09/05/2023   lisinopril (ZESTRIL) 20 MG tablet Take 10 mg by mouth daily.   09/05/2023   metoprolol tartrate (LOPRESSOR) 25 MG tablet Take 12.5 mg by mouth 2 (two) times daily.   09/05/2023   Multiple Vitamins-Minerals (PRESERVISION AREDS 2) CAPS Take 1 capsule by mouth 2 (two) times daily.   09/05/2023   omeprazole (PRILOSEC) 40 MG capsule Take 40 mg by mouth daily before breakfast. Reported on 08/06/2015   09/05/2023   Probiotic Product (PROBIOTIC PO) Take 1 capsule by mouth daily.   09/05/2023   rosuvastatin (CRESTOR) 20 MG tablet Take 20 mg by mouth daily.   09/05/2023   spironolactone (ALDACTONE) 25 MG tablet Take 25 mg by mouth daily.   09/05/2023   vitamin B-12 (CYANOCOBALAMIN) 1000 MCG tablet Take 1,000 mcg by mouth in the morning.   09/05/2023   Social History   Socioeconomic History   Marital status: Married    Spouse name: Erminio   Number of children: 0   Years of education: Not on file   Highest education level: Not on file  Occupational History   Not on file  Tobacco Use   Smoking status: Never   Smokeless tobacco:  Never  Vaping Use   Vaping status: Never Used  Substance and Sexual Activity   Alcohol use: No   Drug use: No   Sexual activity: Not on file  Other Topics Concern   Not on file  Social History Narrative   Lives at home with wife    Social Drivers of Corporate investment banker Strain: Not on file  Food Insecurity: Not on file  Transportation Needs: Not on file  Physical Activity: Not on file  Stress: Not on file  Social Connections: Not on file  Intimate Partner Violence: Not on file    History reviewed. No pertinent family history.   Vitals:   09/07/23 1315 09/07/23 1330 09/07/23 1400 09/07/23 1430  BP: 117/68 118/65 104/63 110/66  Pulse: 76 88 86 87  Resp: 14 (!) 25 13 14   Temp:      TempSrc:      SpO2: 99% 100% 96% 98%  Weight:      Height:        PHYSICAL EXAM General: Well appearing elderly male, well nourished, in no acute distress. HEENT: Normocephalic and atraumatic. Neck: No JVD.   Lungs: Normal respiratory effort on room air. Clear bilaterally to auscultation. No wheezes, crackles, rhonchi.  Heart: HRRR. Normal S1 and S2 without gallops or murmurs.  Abdomen: Non-distended appearing.  Msk: Normal strength and tone for age. Extremities: Warm and well perfused. No clubbing, cyanosis, edema.  Neuro: Alert and oriented X 3. Psych: Answers questions appropriately.   Labs: Basic Metabolic Panel: Recent Labs    09/05/23 1507 09/05/23 1529 09/06/23 0601  NA 133*  --  135  K 4.7  --  5.4*  CL 103  --  104  CO2 20*  --  22  GLUCOSE 125*  --  99  BUN 38*  --  40*  CREATININE 1.92* 2.04* 1.74*  CALCIUM 9.2  --  9.4   Liver Function Tests: No results for input(s): AST, ALT, ALKPHOS, BILITOT, PROT, ALBUMIN in the last 72 hours. No results for input(s): LIPASE, AMYLASE in the last 72 hours. CBC: Recent Labs    09/05/23 1819 09/06/23 0601  WBC 9.2 10.8*  HGB 9.8* 11.5*  HCT 29.3* 35.5*  MCV 98.0 100.3*  PLT 167 201   Cardiac  Enzymes: Recent Labs    09/05/23 1507 09/05/23 1529  TROPONINIHS 20* 22*   BNP: Recent Labs    09/05/23 1610  BNP 198.1*   D-Dimer: No results for input(s): DDIMER in the last 72 hours. Hemoglobin A1C: No results for input(s): HGBA1C in the last 72 hours. Fasting Lipid Panel: Recent Labs    09/05/23 1610  CHOL 90  HDL 25*  LDLCALC 31  TRIG 829*  CHOLHDL 3.6   Thyroid Function Tests: No results for input(s): TSH, T4TOTAL, T3FREE, THYROIDAB in the last 72 hours.  Invalid input(s): FREET3 Anemia Panel: Recent Labs    09/05/23 1529 09/05/23 1819  VITAMINB12  --  489  FOLATE 24.0  --   FERRITIN 199  --   TIBC 251  --   IRON 92  --   RETICCTPCT  --  1.6     Radiology: CARDIAC CATHETERIZATION Result Date: 09/07/2023   Ost LAD to Prox LAD lesion is 90% stenosed.   Non-stenotic Prox LAD to Mid LAD lesion was previously treated.   Non-stenotic 1st Diag lesion was previously treated.   Recommend dual antiplatelet therapy with Aspirin  81mg  daily and Clopidogrel  75mg  daily. 1.  Severe one-vessel CAD involving the ostial LAD 2.  Prior LAD/D1 stent widely patent 3.  Transfer to [facility] for high risk PCI   ECHOCARDIOGRAM COMPLETE Result Date: 09/06/2023    ECHOCARDIOGRAM REPORT   Patient Name:   Raymond Veselka. Date of Exam: 09/06/2023 Medical Rec #:  [id number]        Height:       68.0 in Accession #:    [id number]       Weight:       179.0 lb Date of Birth:  07/24/1945         BSA:          1.950 m Patient Age:    77 years         BP:           108/64 mmHg Patient Gender: M                HR:           78 bpm. Exam Location:  [facility] Procedure: 2D Echo, Cardiac Doppler, Color Doppler and Intracardiac            Opacification Agent (Both Spectral and Color Flow Doppler were            utilized during procedure). Indications:     Chest Pain R07.9  History:         Patient has no prior history of Echocardiogram examinations.                  CAD and Previous Myocardial Infarction,  Pacemaker;                  Signs/Symptoms:Chest Pain.  Sonographer:     Ashley McNeely-Sloane Referring Phys:  [id number] Anthem Frazer Diagnosing Phys: Raymond Alluri IMPRESSIONS  1. Left ventricular ejection fraction, by estimation, is 35 to 40%. The left ventricle has moderately decreased function. The left ventricle demonstrates regional wall motion abnormalities (see scoring diagram/findings for description). Left ventricular  diastolic parameters are consistent with Grade I diastolic dysfunction (impaired relaxation).  2. Right ventricular systolic function is normal. The right ventricular size is normal.  3. The mitral valve is normal in structure. No evidence of mitral valve regurgitation.  4. The aortic  valve is tricuspid. Aortic valve regurgitation is not visualized.  5. The inferior vena cava is normal in size with greater than 50% respiratory variability, suggesting right atrial pressure of 3 mmHg. FINDINGS  Left Ventricle: Left ventricular ejection fraction, by estimation, is 35 to 40%. The left ventricle has moderately decreased function. The left ventricle demonstrates regional wall motion abnormalities. Definity contrast agent was given IV to delineate the left ventricular endocardial borders. The left ventricular internal cavity size was normal in size. There is no left ventricular hypertrophy. Left ventricular diastolic parameters are consistent with Grade I diastolic dysfunction (impaired relaxation).  LV Wall Scoring: The apical septal segment is akinetic. The anterior septum, apical lateral segment, mid inferoseptal segment, apical anterior segment, apical inferior segment, and apex are hypokinetic. The anterior wall, antero-lateral wall, inferior wall, posterior wall, and basal inferoseptal segment are normal. Right Ventricle: The right ventricular size is normal. No increase in right ventricular wall thickness. Right ventricular systolic function is normal. Left Atrium: Left atrial size was  normal in size. Right Atrium: Right atrial size was normal in size. Pericardium: There is no evidence of pericardial effusion. Mitral Valve: The mitral valve is normal in structure. No evidence of mitral valve regurgitation. MV peak gradient, 8.2 mmHg. The mean mitral valve gradient is 3.0 mmHg. Tricuspid Valve: The tricuspid valve is normal in structure. Tricuspid valve regurgitation is trivial. Aortic Valve: The aortic valve is tricuspid. Aortic valve regurgitation is not visualized. Aortic valve mean gradient measures 2.0 mmHg. Aortic valve peak gradient measures 4.1 mmHg. Aortic valve area, by VTI measures 2.58 cm. Pulmonic Valve: The pulmonic valve was not well visualized. Pulmonic valve regurgitation is not visualized. Aorta: The aortic root and ascending aorta are structurally normal, with no evidence of dilitation. Venous: The inferior vena cava is normal in size with greater than 50% respiratory variability, suggesting right atrial pressure of 3 mmHg. IAS/Shunts: The atrial septum is grossly normal.  LEFT VENTRICLE PLAX 2D LVIDd:         4.70 cm     Diastology LVIDs:         3.20 cm     LV e' medial:    5.98 cm/s LV PW:         0.90 cm     LV E/e' medial:  10.0 LV IVS:        0.90 cm     LV e' lateral:   5.55 cm/s LVOT diam:     2.10 cm     LV E/e' lateral: 10.8 LV SV:         52 LV SV Index:   27 LVOT Area:     3.46 cm  LV Volumes (MOD) LV vol d, MOD A2C: 64.1 ml LV vol d, MOD A4C: 80.5 ml LV vol s, MOD A2C: 51.0 ml LV vol s, MOD A4C: 51.7 ml LV SV MOD A2C:     13.1 ml LV SV MOD A4C:     80.5 ml LV SV MOD BP:      22.7 ml RIGHT VENTRICLE             IVC RV Basal diam:  3.00 cm     IVC diam: 1.20 cm RV Mid diam:    2.70 cm RV S prime:     10.40 cm/s TAPSE (M-mode): 1.3 cm LEFT ATRIUM             Index        RIGHT ATRIUM  Index LA diam:        3.10 cm 1.59 cm/m   RA Area:     8.45 cm LA Vol (A2C):   36.2 ml 18.57 ml/m  RA Volume:   15.90 ml 8.16 ml/m LA Vol (A4C):   21.8 ml 11.18 ml/m LA  Biplane Vol: 28.4 ml 14.57 ml/m  AORTIC VALVE                    PULMONIC VALVE AV Area (Vmax):    2.52 cm     PV Vmax:        0.99 m/s AV Area (Vmean):   2.49 cm     PV Vmean:       66.600 cm/s AV Area (VTI):     2.58 cm     PV VTI:         0.208 m AV Vmax:           101.00 cm/s  PV Peak grad:   3.9 mmHg AV Vmean:          69.900 cm/s  PV Mean grad:   2.0 mmHg AV VTI:            0.203 m      RVOT Peak grad: 2 mmHg AV Peak Grad:      4.1 mmHg AV Mean Grad:      2.0 mmHg LVOT Vmax:         73.40 cm/s LVOT Vmean:        50.300 cm/s LVOT VTI:          0.151 m LVOT/AV VTI ratio: 0.74  AORTA Ao Root diam: 3.00 cm Ao Asc diam:  3.10 cm MITRAL VALVE MV Area (PHT): 4.41 cm     SHUNTS MV Area VTI:   1.61 cm     Systemic VTI:  0.15 m MV Peak grad:  8.2 mmHg     Systemic Diam: 2.10 cm MV Mean grad:  3.0 mmHg     Pulmonic VTI:  0.134 m MV Vmax:       1.43 m/s MV Vmean:      73.0 cm/s MV Decel Time: 172 msec MV E velocity: 59.70 cm/s MV A velocity: 122.00 cm/s MV E/A ratio:  0.49 Raymond Bennett Electronically signed by Raymond Bennett Signature Date/Time: 09/06/2023/8:44:18 AM    Final    DG Chest 2 View Result Date: 09/05/2023 CLINICAL DATA:  Chest pain EXAM: CHEST - 2 VIEW COMPARISON:  03/01/2018 FINDINGS: No acute airspace disease or effusion. Stable cardiomediastinal silhouette with aortic atherosclerosis. Interval probable lead less pacemaker. No pneumothorax IMPRESSION: No active cardiopulmonary disease. Electronically Signed   By: Luke Bun M.D.   On: 09/05/2023 15:56    ECHO as above.  TELEMETRY reviewed by me 09/07/2023: sinu rhythm, rate 70s   EKG reviewed by me: sinus tachycardia with RBBB, rate 104 bpm  Data reviewed by me 09/07/2023: last 24h vitals tele labs imaging I/O hospitalist notes.  Principal Problem:   Unstable angina (HCC) Active Problems:   Coronary artery disease    ASSESSMENT AND PLAN:  Raymond Mcclaren. is a 78 y.o. male  with a past medical history of coronary artery disease s/p  DES to LAD/D1 bifurcation (06/22/2023 with Dr. 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 Patient seen by outpatient cardiology and sent to ED on 09/05/23 due to concerning/worsening anginal symptoms for past 3 weeks. Patient describes as burning chest pain that  is relieved with nitroglycerin . Patient left hospital AMA, against medical advice on 07/01. Patient underwent outpatient LHC on 07/02 with Dr. Katina that revealed severe one-vessel CAD involving ostial LAD with prior LAD/D1 stent widely patient. Patient re-admitted under [facility] hospitalist service and will be transferred to [facility] this weekend for high risk PCI scheduled for Monday with Dr. Joshua.    # Unstable Angina # CAD s/p DES to LAD/D1 bifurcation (06/22/23) # Hypertension # Hyperlipidemia Patient presents with burning chest pain for past 3 weeks. Chest discomfort relieved with nitroglycerin . Patient recently hospitalized and left AMA against medical advice on 07/01. Presents today (07/02) for outpatient LHC with Dr. Katina that revealed severe one-vessel CAD, high risk PCI. Trop minimally elevated at 20 > 22 (from recent admission). Lipids revealed LDL 31, TG 170, HDL 25.  -Continue heparin  infusion. -Continue nitroglycerin  infusion.  -Continue home ASA 81 mg, plavix  75 mg, rosuvastatin 20 mg daily. -Continue home metoprolol tartrate 12.5 mg twice daily. -Continue home lisinopril 10 mg daily. -Patient will be transferred to [facility] this weekend for high risk PCI scheduled for Monday (07/07) with Dr. Joshua. Accepting physician pending.   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 Appears euvolemic on exam. BNP elevated at 200. Echo this admission with rEF (35-40%), grade 1 diastolic dysfunction, +RWMA (akinetic apical septal segment, hypokinesis of multiple segments as stated above). Prior EF was 50% on 11/2022. -Continue home  empagliflozin 10 mg daily. -Will consider resuming home spironolactone 25 mg daily when renal function allows. -Metoprolol and Lisinopril as stated above.  -Home dose of diuretics is PO torsemide  20 mg daily. Likely will resume in a few days when renal function stabilizes. -Plan to optimize GDMT as BP, renal function and electrolytes allow.   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 Cr 1.74 on 07/01. Patient received 2x 500 cc of IVF and post-cath fluids today after LHC.  -Continue to monitor renal function closely.   This patient's plan of care was discussed and created with Dr. Wilburn and he is in agreement.  Signed: Dorene Comfort, PA-C  09/07/2023, 2:33 PM [facility]

## 2023-09-07 NOTE — Consult Note (Signed)
 PHARMACY - ANTICOAGULATION CONSULT NOTE  Pharmacy Consult for heparin  infusion Indication: chest pain/ACS  Allergies  Allergen Reactions   Beef-Derived Drug Products Nausea And Vomiting    Patient Measurements: Height: 5' 8 (172.7 cm) Weight: 81.2 kg (179 lb 0.2 oz) (weight as of 09/05/23) IBW/kg (Calculated) : 68.4 HEPARIN  DW (KG): 81.2  Vital Signs: Temp: 97.5 F (36.4 C) (07/02 1200) Temp Source: Temporal (07/02 1200) BP: 106/64 (07/02 1200) Pulse Rate: 83 (07/02 1200)  Labs: Recent Labs    09/05/23 1507 09/05/23 1529 09/05/23 1610 09/05/23 1610 09/05/23 1819 09/06/23 0125 09/06/23 0601 09/06/23 1115  HGB  --   --  9.7*   < > 9.8*  --  11.5*  --   HCT  --   --  29.2*  --  29.3*  --  35.5*  --   PLT  --   --  177  --  167  --  201  --   APTT  --   --  31  --   --   --   --   --   LABPROT  --   --  14.3  --   --   --   --   --   INR  --   --  1.0  --   --   --   --   --   HEPARINUNFRC  --   --   --   --   --  0.44  --  <0.10*  CREATININE 1.92* 2.04*  --   --   --   --  1.74*  --   TROPONINIHS 20* 22*  --   --   --   --   --   --    < > = values in this interval not displayed.    Estimated Creatinine Clearance: 33.3 mL/min (A) (by C-G formula based on SCr of 1.74 mg/dL (H)).   Medical History: Past Medical History:  Diagnosis Date   Basal cell carcinoma 01/17/2014   L lateral top of shoulder ant, L ant top of shoulder post   Basal cell carcinoma 01/01/2016   L mid back   Basal cell carcinoma 11/17/2017   R mid ear helix   Basal cell carcinoma 02/06/2019   L scalp/temple area within the hair above the sideburn/excision   Basal cell carcinoma 08/15/2019   Right chest medial infraclavicular. Superficial and nodular.    Basal cell carcinoma 08/15/2019   Left chest parasternal. Superficial.   Basal cell carcinoma 02/14/2020   L thigh above knee    Basal cell carcinoma 10/20/2022   R lat neck, EDC   Cancer (HCC)    Chronic kidney disease    renal cell  carcinoma   Coronary artery disease    GERD (gastroesophageal reflux disease)    History of colon polyps    History of gastric polyp    History of hiatal hernia    Neuropathy    Non Hodgkin's lymphoma (HCC)    PONV (postoperative nausea and vomiting)    difficulty waking up after surgery   Reflux    Renal mass     Medications:  No home anticoagulants per pharmacist review  Assessment: 78 yo male presented to the ED from cardiologist office due to hypotension and chest pain.  Pharmacy consulted to initiate heparin  infusion.  Patient letf AMA on 09/06/23 and returned 09/07/23 and had Cath. Pharmacy consulted to restart heparin  infusion 2 hours after radial band removal.  Goal of Therapy:  Heparin  level 0.3-0.7 units/ml Monitor platelets by anticoagulation protocol: Yes    Plan:  Start heparin  infusion at 1000 units/hr 2 hours after radial band removal.  Scheduled start of 7/2 @ 1545. Check HL 8 hours after initiation CBC daily while on heparin   Kayla JULIANNA Blew, PharmD 09/07/2023,12:03 PM

## 2023-09-08 ENCOUNTER — Inpatient Hospital Stay

## 2023-09-08 ENCOUNTER — Ambulatory Visit

## 2023-09-08 DIAGNOSIS — Z950 Presence of cardiac pacemaker: Secondary | ICD-10-CM | POA: Insufficient documentation

## 2023-09-08 DIAGNOSIS — I25110 Atherosclerotic heart disease of native coronary artery with unstable angina pectoris: Secondary | ICD-10-CM | POA: Diagnosis not present

## 2023-09-08 DIAGNOSIS — N1831 Chronic kidney disease, stage 3a: Secondary | ICD-10-CM

## 2023-09-08 DIAGNOSIS — C649 Malignant neoplasm of unspecified kidney, except renal pelvis: Secondary | ICD-10-CM | POA: Insufficient documentation

## 2023-09-08 DIAGNOSIS — I255 Ischemic cardiomyopathy: Secondary | ICD-10-CM | POA: Insufficient documentation

## 2023-09-08 DIAGNOSIS — I495 Sick sinus syndrome: Secondary | ICD-10-CM | POA: Insufficient documentation

## 2023-09-08 DIAGNOSIS — I10 Essential (primary) hypertension: Secondary | ICD-10-CM | POA: Diagnosis not present

## 2023-09-08 LAB — BASIC METABOLIC PANEL WITH GFR
Anion gap: 5 (ref 5–15)
BUN: 39 mg/dL — ABNORMAL HIGH (ref 8–23)
CO2: 25 mmol/L (ref 22–32)
Calcium: 8.7 mg/dL — ABNORMAL LOW (ref 8.9–10.3)
Chloride: 105 mmol/L (ref 98–111)
Creatinine, Ser: 1.59 mg/dL — ABNORMAL HIGH (ref 0.61–1.24)
GFR, Estimated: 44 mL/min — ABNORMAL LOW (ref >60)
Glucose, Bld: 122 mg/dL — ABNORMAL HIGH (ref 70–99)
Potassium: 4.8 mmol/L (ref 3.5–5.1)
Sodium: 135 mmol/L (ref 135–145)

## 2023-09-08 LAB — CBC
HCT: 27.3 % — ABNORMAL LOW (ref 39.0–52.0)
Hemoglobin: 9.2 g/dL — ABNORMAL LOW (ref 13.0–17.0)
MCH: 33.2 pg (ref 26.0–34.0)
MCHC: 33.7 g/dL (ref 30.0–36.0)
MCV: 98.6 fL (ref 80.0–100.0)
Platelets: 204 10*3/uL (ref 150–400)
RBC: 2.77 MIL/uL — ABNORMAL LOW (ref 4.22–5.81)
RDW: 13.8 % (ref 11.5–15.5)
WBC: 13.5 10*3/uL — ABNORMAL HIGH (ref 4.0–10.5)
nRBC: 0 % (ref 0.0–0.2)

## 2023-09-08 LAB — HEPARIN LEVEL (UNFRACTIONATED)
Heparin Unfractionated: 0.29 [IU]/mL — ABNORMAL LOW (ref 0.30–0.70)
Heparin Unfractionated: 0.75 [IU]/mL — ABNORMAL HIGH (ref 0.30–0.70)

## 2023-09-08 LAB — HEMOGLOBIN A1C
Hgb A1c MFr Bld: 5.8 % — ABNORMAL HIGH (ref 4.8–5.6)
Mean Plasma Glucose: 120 mg/dL

## 2023-09-08 MED ORDER — ASPIRIN 81 MG PO TBEC
81.0000 mg | DELAYED_RELEASE_TABLET | Freq: Every day | ORAL | Status: AC
Start: 2023-09-08 — End: ?

## 2023-09-08 MED ORDER — HEPARIN (PORCINE) 25000 UT/250ML-% IV SOLN: 1050.0000 [IU]/h | INTRAVENOUS | Status: DC

## 2023-09-08 MED ORDER — IOHEXOL 350 MG/ML SOLN
75.0000 mL | Freq: Once | INTRAVENOUS | Status: AC | PRN
  Administered 2023-09-08: 75 mL via INTRAVENOUS

## 2023-09-08 MED ORDER — HEPARIN BOLUS VIA INFUSION
1200.0000 [IU] | Freq: Once | INTRAVENOUS | Status: AC
  Administered 2023-09-08: 1200 [IU] via INTRAVENOUS
  Filled 2023-09-08: qty 1200

## 2023-09-08 MED ORDER — STROKE: EARLY STAGES OF RECOVERY BOOK
Freq: Once | Status: DC
Start: 2023-09-09 — End: 2023-09-08

## 2023-09-08 MED ORDER — MIDODRINE HCL 5 MG PO TABS
10.0000 mg | ORAL_TABLET | Freq: Once | ORAL | Status: AC
  Administered 2023-09-08: 10 mg via ORAL
  Filled 2023-09-08: qty 2

## 2023-09-08 NOTE — Consult Note (Signed)
 PHARMACY - ANTICOAGULATION CONSULT NOTE  Pharmacy Consult for heparin  infusion Indication: chest pain/ACS  Allergies  Allergen Reactions   Beef-Derived Drug Products Nausea And Vomiting    Patient Measurements: Height: 5' 8 (172.7 cm) Weight: 81.2 kg (179 lb 0.2 oz) (weight as of 09/05/23) IBW/kg (Calculated) : 68.4 HEPARIN  DW (KG): 81.2  Vital Signs: Temp: 97.7 F (36.5 C) (07/02 2313) Temp Source: Oral (07/02 2313) BP: 124/65 (07/03 0000) Pulse Rate: 77 (07/03 0000)  Labs: Recent Labs    09/05/23 1529 09/05/23 1610 09/05/23 1610 09/05/23 1819 09/06/23 0125 09/06/23 0601 09/06/23 1115 09/07/23 1957 09/07/23 2133 09/07/23 2354  HGB  --  9.7*   < > 9.8*  --  11.5*  --  9.9*  --   --   HCT  --  29.2*   < > 29.3*  --  35.5*  --  30.6*  --   --   PLT  --  177   < > 167  --  201  --  105*  --   --   APTT  --  31  --   --   --   --   --   --   --   --   LABPROT  --  14.3  --   --   --   --   --   --   --   --   INR  --  1.0  --   --   --   --   --   --   --   --   HEPARINUNFRC  --   --   --   --  0.44  --  <0.10*  --   --  0.29*  CREATININE 2.04*  --   --   --   --  1.74*  --  1.58*  --   --   TROPONINIHS 22*  --   --   --   --   --   --  35* 30*  --    < > = values in this interval not displayed.    Estimated Creatinine Clearance: 36.7 mL/min (A) (by C-G formula based on SCr of 1.58 mg/dL (H)).   Medical History: Past Medical History:  Diagnosis Date   Basal cell carcinoma 01/17/2014   L lateral top of shoulder ant, L ant top of shoulder post   Basal cell carcinoma 01/01/2016   L mid back   Basal cell carcinoma 11/17/2017   R mid ear helix   Basal cell carcinoma 02/06/2019   L scalp/temple area within the hair above the sideburn/excision   Basal cell carcinoma 08/15/2019   Right chest medial infraclavicular. Superficial and nodular.    Basal cell carcinoma 08/15/2019   Left chest parasternal. Superficial.   Basal cell carcinoma 02/14/2020   L thigh above  knee    Basal cell carcinoma 10/20/2022   R lat neck, EDC   Cancer (HCC)    Chronic kidney disease    renal cell carcinoma   Coronary artery disease    GERD (gastroesophageal reflux disease)    History of colon polyps    History of gastric polyp    History of hiatal hernia    Neuropathy    Non Hodgkin's lymphoma (HCC)    PONV (postoperative nausea and vomiting)    difficulty waking up after surgery   Reflux    Renal mass     Medications:  No home anticoagulants per pharmacist review  Assessment: 78 yo male presented to the ED from cardiologist office due to hypotension and chest pain.  Pharmacy consulted to initiate heparin  infusion.  Patient letf AMA on 09/06/23 and returned 09/07/23 and had Cath. Pharmacy consulted to restart heparin  infusion 2 hours after radial band removal.    Goal of Therapy:  Heparin  level 0.3-0.7 units/ml Monitor platelets by anticoagulation protocol: Yes    Plan:  7/2:  HL @ 2354 = 0.29, SUBtherapeutic  - Will order heparin  1200 units IV X 1 bolus and increase drip rate to 1150 units/hr - Recheck HL 8 hrs after rate change  CBC daily while on heparin   Kloie Whiting D, PharmD 09/08/2023,12:22 AM

## 2023-09-08 NOTE — Progress Notes (Signed)
 SLP Cancellation Note  Patient Details Name: Raymond Bennett. MRN: [id number] DOB: [birth year]   Cancelled treatment:       Reason Eval/Treat Not Completed: Other (comment) (Per chart review, pt is transferring to [facility] today for high risk PCI) Recommend follow up SLP intervention as indicated.   [place] Sarinity Dicicco Clapp, MS, CCC-SLP Speech Language Pathologist Rehab Services; [facility] [phone] (ascom)   [place] J Clapp 09/08/2023, 10:13 AM

## 2023-09-08 NOTE — Care Management Important Message (Signed)
 Important Message  Patient Details  Name: Raymond Bennett. MRN: [id number] Date of Birth: Jul 29, 1945   Important Message Given:  Yes - Medicare IM     Raymond Bennett 09/08/2023, 12:50 PM

## 2023-09-08 NOTE — Progress Notes (Signed)
 OT Cancellation Note  Patient Details Name: Raymond Bennett. MRN: [id number] DOB: 22-Nov-1945   Cancelled Treatment:    Reason Eval/Treat Not Completed: Medical issues which prohibited therapy. Order received, chart reviewed. Pt with planned transfer to [facility] this weekend for high risk PCI, per secure chat with Dr Maree hold off on therapy evaluations at this time.  Marciana Uplinger E Ritesh Opara 09/08/2023, 8:32 AM

## 2023-09-08 NOTE — Progress Notes (Signed)
 [facility] CARDIOLOGY PROGRESS NOTE       Patient ID: Raymond Bennett. MRN: [id number] DOB/AGE: 80/09/1945 77 y.o.  Admit date: 09/07/2023 Referring Physician Dr. Collie Primary Physician Corlis Honor BROCKS, MD Primary Cardiologist Dr. Fernanda. Pride Reason for Consultation chest pain  HPI: Raymond Bennett. is a 78 y.o. male  with a past medical history of coronary artery disease s/p DES to LAD/D1 bifurcation (06/22/2023 with Dr. 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 Patient seen by outpatient cardiology and sent to ED on 09/05/23 due to concerning/worsening anginal symptoms for past 3 weeks. Patient describes as burning chest pain that is relieved with nitroglycerin . Patient left hospital AMA, against medical advice on 07/01. Patient underwent outpatient LHC on 07/02 with Dr. Katina that revealed severe one-vessel CAD involving ostial LAD with prior LAD/D1 stent widely patient. Patient re-admitted under [facility] hospitalist service and will be transferred to [facility] this weekend for high risk PCI scheduled for Monday with Dr. Joshua.     Interval History: -Patient seen and examined this AM and laying comfortably in hospital bed. Patient states he feels okay this AM and continues to reports chest discomfort/burning with any movement or exertion. Denies SOB.  -Last night patient got out of bed to use bathroom, while walking to bathroom patient felt lightheaded and vagal while trying to sit on toilet. Patient's wife and nurse witnessed entire event. Nurses at bedside reports they couldn't feel a pulse and started chest compressions fro less than 30 seconds, however this is unclear that pulse was truly lost. Patient's wife states patient may have LOC for about 10 seconds, however was mumbling words most of the time and patient states she had couple minutes of visual disturbances prior to this event. Neurology consulted. CT head  and CTA ordered and revealed new left precentral gyrus infarct and severe stenosis of right vertebral artery and left PCA.  -Patients BP soft and HR  stable this AM. Overnight Tele showed no significant events.  -Patient remains on room air with stable SpO2.  -Plan is to expedite transfer to [facility] today and keep patient NPO.    Pertinent Cardiac History (Most recent) LHC with Dr. Katina 09/07/2023   Ost LAD to Prox LAD lesion is 90% stenosed.   Non-stenotic Prox LAD to Mid LAD lesion was previously treated.   Non-stenotic 1st Diag lesion was previously treated.   Recommend dual antiplatelet therapy with Aspirin  81mg  daily and Clopidogrel  75mg  daily.   1.  Severe one-vessel CAD involving the ostial LAD 2.  Prior LAD/D1 stent widely patent 3.  Transfer to [facility] for high risk PCI  LHC with Dr. Katina 06/22/2023   Prox LAD to Mid LAD lesion is 80% stenosed.   1st Diag lesion is 80% stenosed.   A stent was successfully placed.   A stent was successfully placed.   Post intervention, there is a 0% residual stenosis.   Post intervention, there is a 0% residual stenosis.   on 06/22/2023. 1.  Severe one-vessel CAD involving the LAD/D1 bifurcation 2.  Successful PTCA and stenting of the LAD/D1 bifurcation with a 2.75 x 22 mm Onyx drug-eluting stent in the LAD and a 2.25 x 8 mm Onyx drug-eluting stent in the diagonal branch with intravascular sound guidance and final kissing balloon inflation   Review of systems complete and found to be negative unless listed above    Past Medical History:  Diagnosis Date   Basal cell carcinoma  01/17/2014   L lateral top of shoulder ant, L ant top of shoulder post   Basal cell carcinoma 01/01/2016   L mid back   Basal cell carcinoma 11/17/2017   R mid ear helix   Basal cell carcinoma 02/06/2019   L scalp/temple area within the hair above the sideburn/excision   Basal cell carcinoma 08/15/2019   Right chest medial infraclavicular. Superficial and nodular.     Basal cell carcinoma 08/15/2019   Left chest parasternal. Superficial.   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   CHOLECYSTECTOMY     COLONOSCOPY WITH PROPOFOL  N/A 02/11/2017   Procedure: COLONOSCOPY WITH PROPOFOL ;  Surgeon: Viktoria Lamar DASEN, MD;  Location: [facility] ENDOSCOPY;  Service: Endoscopy;  Laterality: N/A;   CORONARY STENT INTERVENTION N/A 06/22/2023   Procedure: CORONARY STENT INTERVENTION;  Surgeon: Katina Albright, MD;  Location: [facility] INVASIVE CV LAB;  Service: Cardiovascular;  Laterality: N/A;   CORONARY ULTRASOUND/IVUS N/A 06/22/2023   Procedure: Coronary Ultrasound/IVUS;  Surgeon: Katina Albright, MD;  Location: [facility] INVASIVE CV LAB;  Service: Cardiovascular;  Laterality: N/A;   ESOPHAGOGASTRODUODENOSCOPY (EGD) WITH PROPOFOL  N/A 02/11/2017   Procedure: ESOPHAGOGASTRODUODENOSCOPY (EGD) WITH PROPOFOL ;  Surgeon: Viktoria Lamar DASEN, MD;  Location: [facility] ENDOSCOPY;  Service: Endoscopy;  Laterality: N/A;   FRACTURE SURGERY     GALLBLADDER SURGERY     HERNIA REPAIR Left    Inguinal Hernia Repair   LEFT HEART CATH AND CORONARY ANGIOGRAPHY Left 06/22/2023   Procedure: LEFT HEART CATH AND CORONARY ANGIOGRAPHY;  Surgeon: Katina Albright, MD;  Location: [facility] INVASIVE CV LAB;  Service: Cardiovascular;  Laterality: Left;   LEFT HEART CATH AND CORONARY ANGIOGRAPHY N/A 09/07/2023   Procedure: LEFT HEART CATH AND CORONARY ANGIOGRAPHY;  Surgeon:  Katina Albright, MD;  Location: [facility] INVASIVE CV LAB;  Service: Cardiovascular;  Laterality: N/A;   PACEMAKER LEADLESS INSERTION N/A 11/02/2022   Procedure: PACEMAKER LEADLESS INSERTION;  Surgeon: Ammon Blunt, MD;  Location: [facility] INVASIVE CV LAB;  Service: Cardiovascular;  Laterality: N/A;   ROBOTIC ASSITED PARTIAL NEPHRECTOMY Left 11/12/2015   Procedure: ROBOTIC ASSITED PARTIAL NEPHRECTOMY;  Surgeon: Redell Lynwood Napoleon, MD;  Location: [facility] ORS;  Service: Urology;  Laterality: Left;   TONSILLECTOMY      Medications Prior to Admission  Medication Sig Dispense Refill Last Dose/Taking   Cholecalciferol 25 MCG (1000 UT) tablet Take 1,000 Units by mouth daily.   Past Week   clopidogrel  (PLAVIX ) 75 MG tablet Take 1 tablet (75 mg total) by mouth daily. 30 tablet 0 09/05/2023   empagliflozin (JARDIANCE) 10 MG TABS tablet Take 1 tablet by mouth daily.   Past Week   ferrous sulfate 325 (65 FE) MG tablet Take 325 mg by mouth in the morning.   09/06/2023 Noon   finasteride (  PROPECIA) 1 MG tablet Take 1 tablet by mouth every morning.   Past Week   isosorbide mononitrate (IMDUR) 30 MG 24 hr tablet Take 30 mg by mouth daily.   Past Week   lisinopril (ZESTRIL) 20 MG tablet Take 10 mg by mouth daily.   Past Week   magnesium  oxide (MAG-OX) 400 MG tablet Take 400 mg by mouth daily.   09/06/2023 Noon   metoprolol tartrate (LOPRESSOR) 25 MG tablet Take 12.5 mg by mouth 2 (two) times daily.   Past Week   Multiple Vitamins-Minerals (PRESERVISION AREDS 2) CAPS Take 1 capsule by mouth 2 (two) times daily.   Past Week   nitroGLYCERIN  (NITROSTAT ) 0.4 MG SL tablet Place 0.4 mg under the tongue every 5 (five) minutes x 3 doses as needed for chest pain.   09/07/2023 at  2:00 AM   omeprazole (PRILOSEC) 40 MG capsule Take 40 mg by mouth daily before breakfast. Reported on 08/06/2015   Past Week   Probiotic Product (PROBIOTIC PO) Take 1 capsule by mouth daily.   Past Week   rosuvastatin (CRESTOR) 20 MG tablet Take 20 mg by mouth  daily.   Past Week   spironolactone (ALDACTONE) 25 MG tablet Take 25 mg by mouth daily.   Past Week   vitamin B-12 (CYANOCOBALAMIN) 1000 MCG tablet Take 1,000 mcg by mouth in the morning.   Past Week   aspirin  EC 81 MG tablet Take 1 tablet (81 mg total) by mouth daily. Swallow whole. 30 tablet 0    colchicine 0.6 MG tablet Take 0.6 mg by mouth as needed (gout flare). (Patient not taking: Reported on 09/07/2023)   Not Taking   Social History   Socioeconomic History   Marital status: Married    Spouse name: Erminio   Number of children: 0   Years of education: Not on file   Highest education level: Not on file  Occupational History   Not on file  Tobacco Use   Smoking status: Never   Smokeless tobacco: Never  Vaping Use   Vaping status: Never Used  Substance and Sexual Activity   Alcohol use: No   Drug use: No   Sexual activity: Not on file  Other Topics Concern   Not on file  Social History Narrative   Lives at home with wife    Social Drivers of Health   Financial Resource Strain: Not on file  Food Insecurity: No Food Insecurity (09/07/2023)   Hunger Vital Sign    Worried About Running Out of Food in the Last Year: Never true    Ran Out of Food in the Last Year: Never true  Transportation Needs: No Transportation Needs (09/07/2023)   PRAPARE - Administrator, Civil Service (Medical): No    Lack of Transportation (Non-Medical): No  Physical Activity: Not on file  Stress: Not on file  Social Connections: Socially Isolated (09/07/2023)   Social Connection and Isolation Panel    Frequency of Communication with Friends and Family: Once a week    Frequency of Social Gatherings with Friends and Family: Once a week    Attends Religious Services: Never    Database administrator or Organizations: No    Attends Banker Meetings: Never    Marital Status: Married  Catering manager Violence: Not At Risk (09/07/2023)   Humiliation, Afraid, Rape, and Kick  questionnaire    Fear of Current or Ex-Partner: No    Emotionally Abused: No    Physically  Abused: No    Sexually Abused: No    History reviewed. No pertinent family history.   Vitals:   09/08/23 0700 09/08/23 0800 09/08/23 0830 09/08/23 0900  BP: (!) 115/55 94/63 121/78 106/67  Pulse: (!) 103 93 91 97  Resp: 19 17 18 17   Temp: 98 F (36.7 C) 98 F (36.7 C)    TempSrc: Oral Oral    SpO2: 98% 96% 98% 98%  Weight:      Height:        PHYSICAL EXAM General: Well appearing elderly male, well nourished, in no acute distress. HEENT: Normocephalic and atraumatic. Neck: No JVD.   Lungs: Normal respiratory effort on room air. Clear bilaterally to auscultation. No wheezes, crackles, rhonchi.  Heart: HRRR. Normal S1 and S2 without gallops or murmurs.  Abdomen: Non-distended appearing.  Msk: Normal strength and tone for age. Extremities: Warm and well perfused. No clubbing, cyanosis, edema.  Neuro: Alert and oriented X 3. Psych: Answers questions appropriately.   Labs: Basic Metabolic Panel: Recent Labs    09/07/23 1957 09/08/23 0330  NA 135 135  K 4.8 4.8  CL 106 105  CO2 16* 25  GLUCOSE 118* 122*  BUN 39* 39*  CREATININE 1.58* 1.59*  CALCIUM 8.7* 8.7*  MG 2.1  --   PHOS 3.3  --    Liver Function Tests: Recent Labs    09/07/23 1957  AST 26  ALT 11  ALKPHOS 43  BILITOT 0.7  PROT 6.3*  ALBUMIN 3.4*   No results for input(s): LIPASE, AMYLASE in the last 72 hours. CBC: Recent Labs    09/07/23 1957 09/08/23 0330  WBC 12.6* 13.5*  NEUTROABS 6.4  --   HGB 9.9* 9.2*  HCT 30.6* 27.3*  MCV 99.4 98.6  PLT 105* 204   Cardiac Enzymes: Recent Labs    09/05/23 1529 09/07/23 1957 09/07/23 2133  TROPONINIHS 22* 35* 30*   BNP: Recent Labs    09/05/23 1610 09/07/23 1957  BNP 198.1* 211.0*   D-Dimer: No results for input(s): DDIMER in the last 72 hours. Hemoglobin A1C: No results for input(s): HGBA1C in the last 72 hours. Fasting Lipid  Panel: Recent Labs    09/05/23 1610  CHOL 90  HDL 25*  LDLCALC 31  TRIG 829*  CHOLHDL 3.6   Thyroid Function Tests: Recent Labs    09/07/23 1957  TSH 5.160*   Anemia Panel: Recent Labs    09/05/23 1529 09/05/23 1819  VITAMINB12  --  489  FOLATE 24.0  --   FERRITIN 199  --   TIBC 251  --   IRON 92  --   RETICCTPCT  --  1.6     Radiology: CT ANGIO HEAD NECK W WO CM Result Date: 09/08/2023 CLINICAL DATA:  Stroke, follow up syncopal event high risk CAD possible new infarct on CT without contrast, unable to obtain mri here EXAM: CT ANGIOGRAPHY HEAD AND NECK WITH AND WITHOUT CONTRAST TECHNIQUE: Multidetector CT imaging of the head and neck was performed using the standard protocol during bolus administration of intravenous contrast. Multiplanar CT image reconstructions and MIPs were obtained to evaluate the vascular anatomy. Carotid stenosis measurements (when applicable) are obtained utilizing NASCET criteria, using the distal internal carotid diameter as the denominator. RADIATION DOSE REDUCTION: This exam was performed according to the departmental dose-optimization program which includes automated exposure control, adjustment of the mA and/or kV according to patient size and/or use of iterative reconstruction technique. CONTRAST:  75mL OMNIPAQUE  IOHEXOL  350 MG/ML  SOLN COMPARISON:  CT head September 07, 2023. FINDINGS: CTA NECK FINDINGS Aortic arch: Aortic atherosclerosis. Right carotid system: Atherosclerosis at the carotid bifurcation with approximately 40% stenosis of the ICA origin. Left carotid system: Atherosclerosis at the carotid bifurcation with approximately 40% stenosis of the ICA origin. Vertebral arteries: Left dominant. Severe stenosis of the non dominant right vertebral artery origin. Otherwise, patent without hemodynamically significant stenosis. Skeleton: No acute fracture. Other neck: No acute abnormality on limited assessment. Upper chest: Visualized lung apices are clear.  Review of the MIP images confirms the above findings CTA HEAD FINDINGS Anterior circulation: Bilateral intracranial ICAs are patent with moderate stenosis of the paraclinoid ICAs bilaterally. The MCAs and ACAs are patent are patent without proximal hemodynamically significant stenosis. 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 The left intradural vertebral artery, basilar artery and bilateral posterior cerebral arteries are patent. Severe left P2 PCA stenosis. Venous sinuses: As permitted by contrast timing, patent. Review of the MIP images confirms the above findings IMPRESSION: 1. 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 Severe stenosis of the right vertebral artery origin. 2. Severe left P2 PCA stenosis. 3. Approximately 40% stenosis of the ICA origins bilaterally. 4. Moderate paraclinoid ICA stenosis bilaterally. 5. Aortic Atherosclerosis (ICD10-I70.0). Electronically Signed   By: Gilmore GORMAN Molt M.D.   On: 09/08/2023 02:14   CT HEAD WO CONTRAST ( ) Result Date: 09/07/2023 CLINICAL DATA:  Syncope/presyncope, cerebrovascular cause suspected EXAM: CT HEAD WITHOUT CONTRAST TECHNIQUE: Contiguous axial images were obtained from the base of the skull through the vertex without intravenous contrast. RADIATION DOSE REDUCTION: This exam was performed according to the departmental dose-optimization program which includes automated exposure control, adjustment of the mA and/or kV according to patient size and/or use of iterative reconstruction technique. COMPARISON:  CT head September 02, 2021. MRI head November 19, 2021. FINDINGS: Brain: New posterior left frontal precentral gyrus hypodensity, compatible with potentially acute infarct. No substantial mass effect or midline shift. No evidence of acute hemorrhage, mass lesion or hydrocephalus. Vascular: No hyperdense vessel identified. Skull: No acute fracture. Sinuses/Orbits: Clear  sinuses.  No acute orbital findings. Other: No mastoid effusions. IMPRESSION: New left precentral gyrus infarct, potentially acute. Recommend MRI head to further evaluate. These results will be called to the ordering clinician or representative by the Radiologist Assistant, and communication documented in the PACS or [employer]. Electronically Signed   By: Gilmore GORMAN Molt M.D.   On: 09/07/2023 23:03   CARDIAC CATHETERIZATION Result Date: 09/07/2023   Ost LAD to Prox LAD lesion is 90% stenosed.   Non-stenotic Prox LAD to Mid LAD lesion was previously treated.   Non-stenotic 1st Diag lesion was previously treated.   Recommend dual antiplatelet therapy with Aspirin  81mg  daily and Clopidogrel  75mg  daily. 1.  Severe one-vessel CAD involving the ostial LAD 2.  Prior LAD/D1 stent widely patent 3.  Transfer to [facility] for high risk PCI   ECHOCARDIOGRAM COMPLETE Result Date: 09/06/2023    ECHOCARDIOGRAM REPORT   Patient Name:   Tyvon Eggenberger. Date of Exam: 09/06/2023 Medical Rec #:  [id number]        Height:       68.0 in Accession #:    [id number]       Weight:       179.0 lb Date of Birth:  Nov 04, 1945         BSA:          1.950 m Patient Age:  77 years         BP:           108/64 mmHg Patient Gender: M                HR:           78 bpm. Exam Location:  [facility] Procedure: 2D Echo, Cardiac Doppler, Color Doppler and Intracardiac            Opacification Agent (Both Spectral and Color Flow Doppler were            utilized during procedure). Indications:     Chest Pain R07.9  History:         Patient has no prior history of Echocardiogram examinations.                  CAD and Previous Myocardial Infarction, Pacemaker;                  Signs/Symptoms:Chest Pain.  Sonographer:     Ashley McNeely-Sloane Referring Phys:  [id number] Joetta Delprado Diagnosing Phys: Keller Alluri IMPRESSIONS  1. Left ventricular ejection fraction, by estimation, is 35 to 40%. The left ventricle has moderately decreased function. The left  ventricle demonstrates regional wall motion abnormalities (see scoring diagram/findings for description). Left ventricular  diastolic parameters are consistent with Grade I diastolic dysfunction (impaired relaxation).  2. Right ventricular systolic function is normal. The right ventricular size is normal.  3. The mitral valve is normal in structure. No evidence of mitral valve regurgitation.  4. The aortic valve is tricuspid. Aortic valve regurgitation is not visualized.  5. The inferior vena cava is normal in size with greater than 50% respiratory variability, suggesting right atrial pressure of 3 mmHg. FINDINGS  Left Ventricle: Left ventricular ejection fraction, by estimation, is 35 to 40%. The left ventricle has moderately decreased function. The left ventricle demonstrates regional wall motion abnormalities. Definity contrast agent was given IV to delineate the left ventricular endocardial borders. The left ventricular internal cavity size was normal in size. There is no left ventricular hypertrophy. Left ventricular diastolic parameters are consistent with Grade I diastolic dysfunction (impaired relaxation).  LV Wall Scoring: The apical septal segment is akinetic. The anterior septum, apical lateral segment, mid inferoseptal segment, apical anterior segment, apical inferior segment, and apex are hypokinetic. The anterior wall, antero-lateral wall, inferior wall, posterior wall, and basal inferoseptal segment are normal. Right Ventricle: The right ventricular size is normal. No increase in right ventricular wall thickness. Right ventricular systolic function is normal. Left Atrium: Left atrial size was normal in size. Right Atrium: Right atrial size was normal in size. Pericardium: There is no evidence of pericardial effusion. Mitral Valve: The mitral valve is normal in structure. No evidence of mitral valve regurgitation. MV peak gradient, 8.2 mmHg. The mean mitral valve gradient is 3.0 mmHg. Tricuspid Valve:  The tricuspid valve is normal in structure. Tricuspid valve regurgitation is trivial. Aortic Valve: The aortic valve is tricuspid. Aortic valve regurgitation is not visualized. Aortic valve mean gradient measures 2.0 mmHg. Aortic valve peak gradient measures 4.1 mmHg. Aortic valve area, by VTI measures 2.58 cm. Pulmonic Valve: The pulmonic valve was not well visualized. Pulmonic valve regurgitation is not visualized. Aorta: The aortic root and ascending aorta are structurally normal, with no evidence of dilitation. Venous: The inferior vena cava is normal in size with greater than 50% respiratory variability, suggesting right atrial pressure of 3 mmHg. IAS/Shunts: The atrial septum is grossly normal.  LEFT VENTRICLE PLAX 2D LVIDd:         4.70 cm     Diastology LVIDs:         3.20 cm     LV e' medial:    5.98 cm/s LV PW:         0.90 cm     LV E/e' medial:  10.0 LV IVS:        0.90 cm     LV e' lateral:   5.55 cm/s LVOT diam:     2.10 cm     LV E/e' lateral: 10.8 LV SV:         52 LV SV Index:   27 LVOT Area:     3.46 cm  LV Volumes (MOD) LV vol d, MOD A2C: 64.1 ml LV vol d, MOD A4C: 80.5 ml LV vol s, MOD A2C: 51.0 ml LV vol s, MOD A4C: 51.7 ml LV SV MOD A2C:     13.1 ml LV SV MOD A4C:     80.5 ml LV SV MOD BP:      22.7 ml RIGHT VENTRICLE             IVC RV Basal diam:  3.00 cm     IVC diam: 1.20 cm RV Mid diam:    2.70 cm RV S prime:     10.40 cm/s TAPSE (M-mode): 1.3 cm LEFT ATRIUM             Index        RIGHT ATRIUM          Index LA diam:        3.10 cm 1.59 cm/m   RA Area:     8.45 cm LA Vol (A2C):   36.2 ml 18.57 ml/m  RA Volume:   15.90 ml 8.16 ml/m LA Vol (A4C):   21.8 ml 11.18 ml/m LA Biplane Vol: 28.4 ml 14.57 ml/m  AORTIC VALVE                    PULMONIC VALVE AV Area (Vmax):    2.52 cm     PV Vmax:        0.99 m/s AV Area (Vmean):   2.49 cm     PV Vmean:       66.600 cm/s AV Area (VTI):     2.58 cm     PV VTI:         0.208 m AV Vmax:           101.00 cm/s  PV Peak grad:   3.9 mmHg AV  Vmean:          69.900 cm/s  PV Mean grad:   2.0 mmHg AV VTI:            0.203 m      RVOT Peak grad: 2 mmHg AV Peak Grad:      4.1 mmHg AV Mean Grad:      2.0 mmHg LVOT Vmax:         73.40 cm/s LVOT Vmean:        50.300 cm/s LVOT VTI:          0.151 m LVOT/AV VTI ratio: 0.74  AORTA Ao Root diam: 3.00 cm Ao Asc diam:  3.10 cm MITRAL VALVE MV Area (PHT): 4.41 cm     SHUNTS MV Area VTI:   1.61 cm     Systemic VTI:  0.15 m MV Peak grad:  8.2 mmHg  Systemic Diam: 2.10 cm MV Mean grad:  3.0 mmHg     Pulmonic VTI:  0.134 m MV Vmax:       1.43 m/s MV Vmean:      73.0 cm/s MV Decel Time: 172 msec MV E velocity: 59.70 cm/s MV A velocity: 122.00 cm/s MV E/A ratio:  0.49 Keller Paterson Electronically signed by Keller Paterson Signature Date/Time: 09/06/2023/8:44:18 AM    Final    DG Chest 2 View Result Date: 09/05/2023 CLINICAL DATA:  Chest pain EXAM: CHEST - 2 VIEW COMPARISON:  03/01/2018 FINDINGS: No acute airspace disease or effusion. Stable cardiomediastinal silhouette with aortic atherosclerosis. Interval probable lead less pacemaker. No pneumothorax IMPRESSION: No active cardiopulmonary disease. Electronically Signed   By: Luke Bun M.D.   On: 09/05/2023 15:56    ECHO as above.  TELEMETRY reviewed by me 09/08/2023: sinu rhythm, rate 90s   EKG reviewed by me: sinus tachycardia with RBBB, rate 104 bpm  Data reviewed by me 09/08/2023: last 24h vitals tele labs imaging I/O hospitalist notes.  Principal Problem:   Unstable angina (HCC) Active Problems:   Coronary artery disease    ASSESSMENT AND PLAN:  Raymond Bennett. is a 78 y.o. male  with a past medical history of coronary artery disease s/p DES to LAD/D1 bifurcation (06/22/2023 with Dr. 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 Patient seen by outpatient cardiology and sent to ED on 09/05/23 due to concerning/worsening anginal symptoms for past 3 weeks. Patient describes  as burning chest pain that is relieved with nitroglycerin . Patient left hospital AMA, against medical advice on 07/01. Patient underwent outpatient LHC on 07/02 with Dr. Katina that revealed severe one-vessel CAD involving ostial LAD with prior LAD/D1 stent widely patient. Patient re-admitted under [facility] hospitalist service and will be transferred to [facility] this weekend for high risk PCI scheduled for Monday with Dr. Joshua.    # Unstable Angina # CAD s/p DES to LAD/D1 bifurcation (06/22/23) # Hypertension # Hyperlipidemia Patient presents with burning chest pain for past 3 weeks. Chest discomfort relieved with nitroglycerin . Patient recently hospitalized and left AMA against medical advice on 07/01. Presents today (07/02) for outpatient LHC with Dr. Katina that revealed severe one-vessel CAD, high risk PCI. Trop minimally elevated at 20 > 22 (from recent admission). Lipids revealed LDL 31, TG 170, HDL 25.  -Continue heparin  infusion. -Continue nitroglycerin  infusion.  -Continue home ASA 81 mg, plavix  75 mg, rosuvastatin 20 mg daily. -Continue home metoprolol tartrate 12.5 mg twice daily. -Hold home lisinopril 10 mg daily due to soft BP. -Will expedite transfer to [facility] today for high risk PCI. Keep patient NPO. Accepting physician is Dr. Dixon and will be transferred to stepdown unit at [facility].   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 Appears euvolemic on exam. BNP elevated at 200. Echo this admission with rEF (35-40%), grade 1 diastolic dysfunction, +RWMA (akinetic apical septal segment, hypokinesis of multiple segments as stated above). Prior EF was 50% on 11/2022. -Continue home empagliflozin 10 mg daily. -Will consider resuming home spironolactone 25 mg daily when renal function allows. -Metoprolol and Lisinopril as stated above.  -Likely will resume home dose PO torsemide  20 mg in a few days if renal function stabilizes. -Plan to optimize GDMT as BP, renal  function and electrolytes allow.   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 Cr 1.74 on 07/01. Patient received 2x 500 cc  of IVF and post-cath fluids today after LHC. Cr improved today 1.59. -Continue to monitor renal function closely.  Expedite transfer to [facility] today. Accepting physician is Dr. Dixon and will be transferred to stepdown unit at [facility]. Keep patient NPO.   This patient's plan of care was discussed and created with Dr. Wilburn and he is in agreement.  Signed: Dorene Comfort, PA-C  09/08/2023, 9:40 AM [facility]

## 2023-09-08 NOTE — Progress Notes (Signed)
 Report called to April, RN at [facility]. Patient going to room 7116.

## 2023-09-08 NOTE — Progress Notes (Signed)
 PT Cancellation Note  Patient Details Name: Raymond Bennett. MRN: [id number] DOB: Aug 11, 1945   Cancelled Treatment:    Reason Eval/Treat Not Completed: Other (comment). Orders received and chart reviewed. Per EMR, OT reporting from attending MD that pt is transferring to [facility] this weekend for high risk PCI and requesting therapies to hold.    Dorina HERO. Fairly IV, PT, DPT Physical Therapist- [place]  [facility] 09/08/2023, 9:22 AM

## 2023-09-08 NOTE — Progress Notes (Signed)
 Report given to Gracie, Charity fundraiser with [employer].

## 2023-09-08 NOTE — Plan of Care (Signed)

## 2023-09-08 NOTE — Progress Notes (Signed)
 Patient left unit by stretcher with [facility] Flight team. Patient alert with no distress noted when leaving unit.

## 2023-09-08 NOTE — Progress Notes (Signed)
 Patient being transferred to [facility]. PIV x2 in place. Nitroglycerin  and heparin  running. Report called to [phone]. Patient going to room 7116

## 2023-09-08 NOTE — Progress Notes (Signed)
 PHARMACY - ANTICOAGULATION CONSULT NOTE  Pharmacy Consult for heparin  dosage adjustment  Indication: chest pain/ACS  Allergies  Allergen Reactions   Beef-Derived Drug Products Nausea And Vomiting    Patient Measurements: Height: 5' 8 (172.7 cm) Weight: 81.2 kg (179 lb 0.2 oz) (weight as of 09/05/23) IBW/kg (Calculated) : 68.4 HEPARIN  DW (KG): 81.2  Vital Signs: Temp: 98 F (36.7 C) (07/03 0800) Temp Source: Oral (07/03 0800) BP: 127/79 (07/03 1030) Pulse Rate: 92 (07/03 1030)  Labs: Recent Labs    09/05/23 1529 09/05/23 1610 09/05/23 1819 09/06/23 0601 09/06/23 1115 09/07/23 1957 09/07/23 2133 09/07/23 2354 09/08/23 0330 09/08/23 0953  HGB  --  9.7*   < > 11.5*  --  9.9*  --   --  9.2*  --   HCT  --  29.2*   < > 35.5*  --  30.6*  --   --  27.3*  --   PLT  --  177   < > 201  --  105*  --   --  204  --   APTT  --  31  --   --   --   --   --   --   --   --   LABPROT  --  14.3  --   --   --   --   --   --   --   --   INR  --  1.0  --   --   --   --   --   --   --   --   HEPARINUNFRC  --   --    < >  --  <0.10*  --   --  0.29*  --  0.75*  CREATININE 2.04*  --   --  1.74*  --  1.58*  --   --  1.59*  --   TROPONINIHS 22*  --   --   --   --  35* 30*  --   --   --    < > = values in this interval not displayed.    Estimated Creatinine Clearance: 36.4 mL/min (A) (by C-G formula based on SCr of 1.59 mg/dL (H)).   Medical History: Past Medical History:  Diagnosis Date   Basal cell carcinoma 01/17/2014   L lateral top of shoulder ant, L ant top of shoulder post   Basal cell carcinoma 01/01/2016   L mid back   Basal cell carcinoma 11/17/2017   R mid ear helix   Basal cell carcinoma 02/06/2019   L scalp/temple area within the hair above the sideburn/excision   Basal cell carcinoma 08/15/2019   Right chest medial infraclavicular. Superficial and nodular.    Basal cell carcinoma 08/15/2019   Left chest parasternal. Superficial.   Basal cell carcinoma 02/14/2020   L  thigh above knee    Basal cell carcinoma 10/20/2022   R lat neck, EDC   Cancer (HCC)    Chronic kidney disease    renal cell carcinoma   Coronary artery disease    GERD (gastroesophageal reflux disease)    History of colon polyps    History of gastric polyp    History of hiatal hernia    Neuropathy    Non Hodgkin's lymphoma (HCC)    PONV (postoperative nausea and vomiting)    difficulty waking up after surgery   Reflux    Renal mass     Medications:  Scheduled:   [START ON  09/09/2023]  stroke: early stages of recovery book   Does not apply Once   aspirin  EC  81 mg Oral Daily   clopidogrel   75 mg Oral Daily   empagliflozin  10 mg Oral Daily   magnesium  oxide  400 mg Oral Daily   metoprolol tartrate  12.5 mg Oral BID   pantoprazole   40 mg Oral Daily   rosuvastatin  20 mg Oral Daily   sodium chloride  flush  3 mL Intravenous Q12H   Infusions:   sodium chloride      heparin  1,150 Units/hr (09/08/23 1012)   nitroGLYCERIN  40 mcg/min (09/08/23 1012)   PRN: sodium chloride , acetaminophen  **OR** acetaminophen , acetaminophen , ondansetron  (ZOFRAN ) IV, senna-docusate, sodium chloride  flush  Assessment: 78 year old patient presenting with chest pain to be transferred to [facility] for high-risk PCI. PMH CAD, cardiomyopathy, HTN, GERD, and CKD. Heparin  dosing weight is 81.2 kg with no obesity adjustment required. Scr is elevated but around baseline at 1.59 mg/dL (36 mL/min). Hemoglobin has dropped to 9.2 g/dL (9.9 on 2/7,88.4 on 7/1) and should be monitored closely for possibility of bleeding episode. Platelets stable at 204. Notable drug interactions with clopidogrel  and aspirin  (increased bleeding risk), and nitroglycerin  (decreased heparin  efficacy). Previously receiving 1,150 units/hr resulting in elevated heparin  level of 0.75 (7/3 at 1000).   Goal of Therapy:  Heparin  level 0.3-0.7 units/ml Monitor platelets by anticoagulation protocol: Yes   Plan:  Decrease heparin  infusion to 1,050  units/hr. Monitor heparin  level in 8 hours (at 1900). Continue monitoring CBC daily for hemoglobin and platelets daily with AM labs.   Chauntel Windsor [place] 09/08/2023,10:52 AM

## 2023-09-08 NOTE — Discharge Summary (Signed)
 Physician Discharge Summary   Patient: Raymond Bennett. MRN: [id number] DOB: 25-Feb-1946  Admit date:     09/07/2023  Discharge date: 09/08/23  Discharge Physician: Cresencio Fairly   PCP: Corlis Honor BROCKS, MD   Recommendations at discharge:   Transfer to [facility] for high risk PCI.  Keep patient n.p.o. accepting physician is Dr. Dock.  Patient to be transferred to stepdown unit at [facility]  Discharge Diagnoses: Principal Problem:   Unstable angina [facility]) Active Problems:   Coronary artery disease  Hospital Course: Assessment and Plan:  Dedrick Heffner. is a 78 y.o. male  with a past medical history of coronary artery disease s/p DES to LAD/D1 bifurcation (06/22/2023 with Dr. 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 Patient seen by outpatient cardiology and sent to ED on 09/05/23 due to concerning/worsening anginal symptoms for past 3 weeks. Patient describes as burning chest pain that is relieved with nitroglycerin . Patient left hospital AMA, (against medical advice) on 07/01. Patient underwent outpatient LHC on 07/02 with Dr. Katina that revealed severe one-vessel CAD involving ostial LAD with prior LAD/D1 stent widely patient. Patient re-admitted under [facility] hospitalist service and will be transferred to [facility] this weekend for high risk PCI scheduled for Monday with Dr. Joshua.  Transfer to [facility] for high risk PCI.  Keep patient n.p.o. accepting physician is Dr. Dock.  Patient to be transferred to stepdown unit at [facility]   # Unstable Angina # CAD s/p DES to LAD/D1 bifurcation (06/22/23) # Hypertension # Hyperlipidemia Patient presents with burning chest pain for past 3 weeks. Chest discomfort relieved with nitroglycerin . Patient recently hospitalized and left AMA against medical advice on 07/01. Presents today (07/02) for outpatient LHC with Dr. Katina that revealed severe one-vessel CAD, high risk PCI. Trop  minimally elevated at 20 > 22 (from recent admission). Lipids revealed LDL 31, TG 170, HDL 25.  -Continue heparin  infusion. -Continue nitroglycerin  infusion.  -Continue home ASA 81 mg, plavix  75 mg, rosuvastatin 20 mg daily. -Continue home metoprolol tartrate 12.5 mg twice daily. -Hold home lisinopril 10 mg daily due to soft BP. - He is being transfer to [facility] today for high risk PCI. Keep patient NPO. Accepting physician is Dr. Dock and will be transferred to stepdown unit at [facility].    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 Appears euvolemic on exam. BNP elevated at 200. Echo this admission with rEF (35-40%), grade 1 diastolic dysfunction, +RWMA (akinetic apical septal segment, hypokinesis of multiple segments as stated above). Prior EF was 50% on 11/2022. -Continue home empagliflozin 10 mg daily.   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 Cr 1.74 on 07/01. Patient received 2x 500 cc of IVF and post-cath fluids today after LHC. Cr improved today 1.59.   He is getting transfer to [facility] today. Accepting physician is Dr. Dixon and will be transferred to stepdown unit at [facility]. Keep patient NPO.        Consultants: Cardiology Procedures performed: Cardiac cath on 7/2 Disposition: [facility] Diet recommendation:  Discharge Diet Orders (From admission, onward)     Start     Ordered   09/08/23 0000  Diet - low sodium heart healthy        09/08/23 1004           NPO   DISCHARGE MEDICATION: Allergies as of 09/08/2023       Reactions  Beef-derived Drug Products Nausea And Vomiting        Medication List     STOP taking these medications    colchicine 0.6 MG tablet       TAKE these medications    aspirin  EC 81 MG tablet Take 1 tablet (81 mg total) by mouth daily. Swallow whole.   Cholecalciferol 25 MCG (1000 UT) tablet Take 1,000 Units by mouth daily.   clopidogrel  75 MG tablet Commonly known as:  Plavix  Take 1 tablet (75 mg total) by mouth daily.   cyanocobalamin 1000 MCG tablet Commonly known as: VITAMIN B12 Take 1,000 mcg by mouth in the morning.   empagliflozin 10 MG Tabs tablet Commonly known as: JARDIANCE Take 1 tablet by mouth daily.   ferrous sulfate 325 (65 FE) MG tablet Take 325 mg by mouth in the morning.   finasteride 1 MG tablet Commonly known as: PROPECIA Take 1 tablet by mouth every morning.   isosorbide mononitrate 30 MG 24 hr tablet Commonly known as: IMDUR Take 30 mg by mouth daily.   lisinopril 20 MG tablet Commonly known as: ZESTRIL Take 10 mg by mouth daily.   magnesium  oxide 400 MG tablet Commonly known as: MAG-OX Take 400 mg by mouth daily.   metoprolol tartrate 25 MG tablet Commonly known as: LOPRESSOR Take 12.5 mg by mouth 2 (two) times daily.   nitroGLYCERIN  0.4 MG SL tablet Commonly known as: NITROSTAT  Place 0.4 mg under the tongue every 5 (five) minutes x 3 doses as needed for chest pain.   omeprazole 40 MG capsule Commonly known as: PRILOSEC Take 40 mg by mouth daily before breakfast. Reported on 08/06/2015   PreserVision AREDS 2 Caps Take 1 capsule by mouth 2 (two) times daily.   PROBIOTIC PO Take 1 capsule by mouth daily.   rosuvastatin 20 MG tablet Commonly known as: CRESTOR Take 20 mg by mouth daily.   spironolactone 25 MG tablet Commonly known as: ALDACTONE Take 25 mg by mouth daily.        Follow-up Information     Corlis Honor BROCKS, MD. Schedule an appointment as soon as possible for a visit in 1 week(s).   Specialty: Internal Medicine Why: [facility] Discharge F/UP Contact information: [address]   [place] [address] [phone]                Discharge Exam: Fredricka Weights   09/07/23 1053  Weight: 81.2 kg   Constitutional:      General: He is not in acute distress.    Appearance: He is normal weight. He is not toxic-appearing.  HENT:     Head: Normocephalic and atraumatic.      Mouth/Throat:     Mouth: Mucous membranes are moist.  Eyes:     Extraocular Movements: Extraocular movements intact.     Pupils: Pupils are equal, round, and reactive to light.  Cardiovascular:     Rate and Rhythm: Normal rate and regular rhythm.  Pulmonary:     Effort: Pulmonary effort is normal. No respiratory distress.     Breath sounds: Normal breath sounds.  Abdominal:     General: Bowel sounds are normal. There is no distension.     Palpations: Abdomen is soft.  Musculoskeletal:        General: No swelling.     Cervical back: Neck supple.     Right lower leg: No edema.     Left lower leg: No edema.  Skin:  General: Skin is warm and dry.  Neurological:     General: No focal deficit present.     Mental Status: He is alert and oriented to person, place, and time.     Cranial Nerves: No cranial nerve deficit.  Psychiatric:        Mood and Affect: Mood normal.        Behavior: Behavior normal.   Condition at discharge: fair  The results of significant diagnostics from this hospitalization (including imaging, microbiology, ancillary and laboratory) are listed below for reference.   Imaging Studies: CT ANGIO HEAD NECK W WO CM Result Date: 09/08/2023 CLINICAL DATA:  Stroke, follow up syncopal event high risk CAD possible new infarct on CT without contrast, unable to obtain mri here EXAM: CT ANGIOGRAPHY HEAD AND NECK WITH AND WITHOUT CONTRAST TECHNIQUE: Multidetector CT imaging of the head and neck was performed using the standard protocol during bolus administration of intravenous contrast. Multiplanar CT image reconstructions and MIPs were obtained to evaluate the vascular anatomy. Carotid stenosis measurements (when applicable) are obtained utilizing NASCET criteria, using the distal internal carotid diameter as the denominator. RADIATION DOSE REDUCTION: This exam was performed according to the departmental dose-optimization program which includes automated exposure control,  adjustment of the mA and/or kV according to patient size and/or use of iterative reconstruction technique. CONTRAST:  75mL OMNIPAQUE  IOHEXOL  350 MG/ML SOLN COMPARISON:  CT head September 07, 2023. FINDINGS: CTA NECK FINDINGS Aortic arch: Aortic atherosclerosis. Right carotid system: Atherosclerosis at the carotid bifurcation with approximately 40% stenosis of the ICA origin. Left carotid system: Atherosclerosis at the carotid bifurcation with approximately 40% stenosis of the ICA origin. Vertebral arteries: Left dominant. Severe stenosis of the non dominant right vertebral artery origin. Otherwise, patent without hemodynamically significant stenosis. Skeleton: No acute fracture. Other neck: No acute abnormality on limited assessment. Upper chest: Visualized lung apices are clear. Review of the MIP images confirms the above findings CTA HEAD FINDINGS Anterior circulation: Bilateral intracranial ICAs are patent with moderate stenosis of the paraclinoid ICAs bilaterally. The MCAs and ACAs are patent are patent without proximal hemodynamically significant stenosis. 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 The left intradural vertebral artery, basilar artery and bilateral posterior cerebral arteries are patent. Severe left P2 PCA stenosis. Venous sinuses: As permitted by contrast timing, patent. Review of the MIP images confirms the above findings IMPRESSION: 1. 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 Severe stenosis of the right vertebral artery origin. 2. Severe left P2 PCA stenosis. 3. Approximately 40% stenosis of the ICA origins bilaterally. 4. Moderate paraclinoid ICA stenosis bilaterally. 5. Aortic Atherosclerosis (ICD10-I70.0). Electronically Signed   By: Gilmore GORMAN Molt M.D.   On: 09/08/2023 02:14   CT HEAD WO CONTRAST ( ) Result Date: 09/07/2023 CLINICAL DATA:  Syncope/presyncope, cerebrovascular cause suspected EXAM: CT HEAD  WITHOUT CONTRAST TECHNIQUE: Contiguous axial images were obtained from the base of the skull through the vertex without intravenous contrast. RADIATION DOSE REDUCTION: This exam was performed according to the departmental dose-optimization program which includes automated exposure control, adjustment of the mA and/or kV according to patient size and/or use of iterative reconstruction technique. COMPARISON:  CT head September 02, 2021. MRI head November 19, 2021. FINDINGS: Brain: New posterior left frontal precentral gyrus hypodensity, compatible with potentially acute infarct. No substantial mass effect or midline shift. No evidence of acute hemorrhage, mass lesion or hydrocephalus. Vascular: No hyperdense vessel identified. Skull: No acute fracture. Sinuses/Orbits: Clear sinuses.  No acute  orbital findings. Other: No mastoid effusions. IMPRESSION: New left precentral gyrus infarct, potentially acute. Recommend MRI head to further evaluate. These results will be called to the ordering clinician or representative by the Radiologist Assistant, and communication documented in the PACS or [employer]. Electronically Signed   By: Gilmore GORMAN Molt M.D.   On: 09/07/2023 23:03   CARDIAC CATHETERIZATION Result Date: 09/07/2023   Ost LAD to Prox LAD lesion is 90% stenosed.   Non-stenotic Prox LAD to Mid LAD lesion was previously treated.   Non-stenotic 1st Diag lesion was previously treated.   Recommend dual antiplatelet therapy with Aspirin  81mg  daily and Clopidogrel  75mg  daily. 1.  Severe one-vessel CAD involving the ostial LAD 2.  Prior LAD/D1 stent widely patent 3.  Transfer to [facility] for high risk PCI   ECHOCARDIOGRAM COMPLETE Result Date: 09/06/2023    ECHOCARDIOGRAM REPORT   Patient Name:   Laurent Cargile. Date of Exam: 09/06/2023 Medical Rec #:  [id number]        Height:       68.0 in Accession #:    [id number]       Weight:       179.0 lb Date of Birth:  10-25-1945         BSA:          1.950 m Patient Age:    77  years         BP:           108/64 mmHg Patient Gender: M                HR:           78 bpm. Exam Location:  [facility] Procedure: 2D Echo, Cardiac Doppler, Color Doppler and Intracardiac            Opacification Agent (Both Spectral and Color Flow Doppler were            utilized during procedure). Indications:     Chest Pain R07.9  History:         Patient has no prior history of Echocardiogram examinations.                  CAD and Previous Myocardial Infarction, Pacemaker;                  Signs/Symptoms:Chest Pain.  Sonographer:     Ashley McNeely-Sloane Referring Phys:  [id number] GABRIELLA DECOSTE Diagnosing Phys: Keller Alluri IMPRESSIONS  1. Left ventricular ejection fraction, by estimation, is 35 to 40%. The left ventricle has moderately decreased function. The left ventricle demonstrates regional wall motion abnormalities (see scoring diagram/findings for description). Left ventricular  diastolic parameters are consistent with Grade I diastolic dysfunction (impaired relaxation).  2. Right ventricular systolic function is normal. The right ventricular size is normal.  3. The mitral valve is normal in structure. No evidence of mitral valve regurgitation.  4. The aortic valve is tricuspid. Aortic valve regurgitation is not visualized.  5. The inferior vena cava is normal in size with greater than 50% respiratory variability, suggesting right atrial pressure of 3 mmHg. FINDINGS  Left Ventricle: Left ventricular ejection fraction, by estimation, is 35 to 40%. The left ventricle has moderately decreased function. The left ventricle demonstrates regional wall motion abnormalities. Definity contrast agent was given IV to delineate the left ventricular endocardial borders. The left ventricular internal cavity size was normal in size. There is no left ventricular hypertrophy. Left ventricular diastolic parameters are consistent with Grade  I diastolic dysfunction (impaired relaxation).  LV Wall Scoring: The apical septal  segment is akinetic. The anterior septum, apical lateral segment, mid inferoseptal segment, apical anterior segment, apical inferior segment, and apex are hypokinetic. The anterior wall, antero-lateral wall, inferior wall, posterior wall, and basal inferoseptal segment are normal. Right Ventricle: The right ventricular size is normal. No increase in right ventricular wall thickness. Right ventricular systolic function is normal. Left Atrium: Left atrial size was normal in size. Right Atrium: Right atrial size was normal in size. Pericardium: There is no evidence of pericardial effusion. Mitral Valve: The mitral valve is normal in structure. No evidence of mitral valve regurgitation. MV peak gradient, 8.2 mmHg. The mean mitral valve gradient is 3.0 mmHg. Tricuspid Valve: The tricuspid valve is normal in structure. Tricuspid valve regurgitation is trivial. Aortic Valve: The aortic valve is tricuspid. Aortic valve regurgitation is not visualized. Aortic valve mean gradient measures 2.0 mmHg. Aortic valve peak gradient measures 4.1 mmHg. Aortic valve area, by VTI measures 2.58 cm. Pulmonic Valve: The pulmonic valve was not well visualized. Pulmonic valve regurgitation is not visualized. Aorta: The aortic root and ascending aorta are structurally normal, with no evidence of dilitation. Venous: The inferior vena cava is normal in size with greater than 50% respiratory variability, suggesting right atrial pressure of 3 mmHg. IAS/Shunts: The atrial septum is grossly normal.  LEFT VENTRICLE PLAX 2D LVIDd:         4.70 cm     Diastology LVIDs:         3.20 cm     LV e' medial:    5.98 cm/s LV PW:         0.90 cm     LV E/e' medial:  10.0 LV IVS:        0.90 cm     LV e' lateral:   5.55 cm/s LVOT diam:     2.10 cm     LV E/e' lateral: 10.8 LV SV:         52 LV SV Index:   27 LVOT Area:     3.46 cm  LV Volumes (MOD) LV vol d, MOD A2C: 64.1 ml LV vol d, MOD A4C: 80.5 ml LV vol s, MOD A2C: 51.0 ml LV vol s, MOD A4C: 51.7 ml LV  SV MOD A2C:     13.1 ml LV SV MOD A4C:     80.5 ml LV SV MOD BP:      22.7 ml RIGHT VENTRICLE             IVC RV Basal diam:  3.00 cm     IVC diam: 1.20 cm RV Mid diam:    2.70 cm RV S prime:     10.40 cm/s TAPSE (M-mode): 1.3 cm LEFT ATRIUM             Index        RIGHT ATRIUM          Index LA diam:        3.10 cm 1.59 cm/m   RA Area:     8.45 cm LA Vol (A2C):   36.2 ml 18.57 ml/m  RA Volume:   15.90 ml 8.16 ml/m LA Vol (A4C):   21.8 ml 11.18 ml/m LA Biplane Vol: 28.4 ml 14.57 ml/m  AORTIC VALVE                    PULMONIC VALVE AV Area (Vmax):    2.52 cm  PV Vmax:        0.99 m/s AV Area (Vmean):   2.49 cm     PV Vmean:       66.600 cm/s AV Area (VTI):     2.58 cm     PV VTI:         0.208 m AV Vmax:           101.00 cm/s  PV Peak grad:   3.9 mmHg AV Vmean:          69.900 cm/s  PV Mean grad:   2.0 mmHg AV VTI:            0.203 m      RVOT Peak grad: 2 mmHg AV Peak Grad:      4.1 mmHg AV Mean Grad:      2.0 mmHg LVOT Vmax:         73.40 cm/s LVOT Vmean:        50.300 cm/s LVOT VTI:          0.151 m LVOT/AV VTI ratio: 0.74  AORTA Ao Root diam: 3.00 cm Ao Asc diam:  3.10 cm MITRAL VALVE MV Area (PHT): 4.41 cm     SHUNTS MV Area VTI:   1.61 cm     Systemic VTI:  0.15 m MV Peak grad:  8.2 mmHg     Systemic Diam: 2.10 cm MV Mean grad:  3.0 mmHg     Pulmonic VTI:  0.134 m MV Vmax:       1.43 m/s MV Vmean:      73.0 cm/s MV Decel Time: 172 msec MV E velocity: 59.70 cm/s MV A velocity: 122.00 cm/s MV E/A ratio:  0.49 Keller Paterson Electronically signed by Keller Paterson Signature Date/Time: 09/06/2023/8:44:18 AM    Final    DG Chest 2 View Result Date: 09/05/2023 CLINICAL DATA:  Chest pain EXAM: CHEST - 2 VIEW COMPARISON:  03/01/2018 FINDINGS: No acute airspace disease or effusion. Stable cardiomediastinal silhouette with aortic atherosclerosis. Interval probable lead less pacemaker. No pneumothorax IMPRESSION: No active cardiopulmonary disease. Electronically Signed   By: Luke Bun M.D.   On:  09/05/2023 15:56    Microbiology: Results for orders placed or performed in visit on 11/07/19  Novel Coronavirus, NAA ([facility])     Status: None   Collection Time: 11/07/19  2:03 PM   Specimen: Nasopharyngeal(NP) swabs in vial transport medium   Nasopharynge  Screenin  Result Value Ref Range Status   SARS-CoV-2, NAA Not Detected Not Detected Final    Comment: This nucleic acid amplification test was developed and its performance characteristics determined by [employer]. Nucleic acid amplification tests include RT-PCR and TMA. This test has not been FDA cleared or approved. This test has been authorized by FDA under an Emergency Use Authorization (EUA). This test is only authorized for the duration of time the declaration that circumstances exist justifying the authorization of the emergency use of in vitro diagnostic tests for detection of SARS-CoV-2 virus and/or diagnosis of COVID-19 infection under section 564(b)(1) of the Act, 21 U.S.C. 639aaa-6(a) (1), unless the authorization is terminated or revoked sooner. When diagnostic testing is negative, the possibility of a false negative result should be considered in the context of a patient's recent exposures and the presence of clinical signs and symptoms consistent with COVID-19. An individual without symptoms of COVID-19 and who is not shedding SARS-CoV-2 virus wo uld expect to have a negative (not detected) result in this assay.  Labs: CBC: Recent Labs  Lab 09/05/23 1610 09/05/23 1819 09/06/23 0601 09/07/23 1957 09/08/23 0330  WBC 8.6 9.2 10.8* 12.6* 13.5*  NEUTROABS  --   --   --  6.4  --   HGB 9.7* 9.8* 11.5* 9.9* 9.2*  HCT 29.2* 29.3* 35.5* 30.6* 27.3*  MCV 98.6 98.0 100.3* 99.4 98.6  PLT 177 167 201 105* 204   Basic Metabolic Panel: Recent Labs  Lab 09/05/23 1507 09/05/23 1529 09/06/23 0601 09/07/23 1957 09/08/23 0330  NA 133*  --  135 135 135  K 4.7  --  5.4* 4.8 4.8  CL 103  --  104 106  105  CO2 20*  --  22 16* 25  GLUCOSE 125*  --  99 118* 122*  BUN 38*  --  40* 39* 39*  CREATININE 1.92* 2.04* 1.74* 1.58* 1.59*  CALCIUM 9.2  --  9.4 8.7* 8.7*  MG  --   --   --  2.1  --   PHOS  --   --   --  3.3  --    Liver Function Tests: Recent Labs  Lab 09/07/23 1957  AST 26  ALT 11  ALKPHOS 43  BILITOT 0.7  PROT 6.3*  ALBUMIN 3.4*   CBG: Recent Labs  Lab 09/07/23 1943  GLUCAP 118*    Discharge time spent: greater than 30 minutes.  Signed: Cresencio Fairly, MD [facility] 09/08/2023

## 2023-09-10 LAB — LIPOPROTEIN A (LPA): Lipoprotein (a): 257.3 nmol/L — ABNORMAL HIGH (ref <75.0)

## 2023-09-13 ENCOUNTER — Ambulatory Visit

## 2023-09-15 ENCOUNTER — Ambulatory Visit

## 2023-09-15 VITALS — BP 108/52 | HR 86 | Ht 68.0 in | Wt 178.2 lb

## 2023-09-15 DIAGNOSIS — N1832 Chronic kidney disease, stage 3b: Secondary | ICD-10-CM

## 2023-09-15 DIAGNOSIS — I25110 Atherosclerotic heart disease of native coronary artery with unstable angina pectoris: Secondary | ICD-10-CM

## 2023-09-15 DIAGNOSIS — D631 Anemia in chronic kidney disease: Secondary | ICD-10-CM

## 2023-09-15 DIAGNOSIS — I10 Essential (primary) hypertension: Secondary | ICD-10-CM | POA: Diagnosis not present

## 2023-09-15 DIAGNOSIS — I429 Cardiomyopathy, unspecified: Secondary | ICD-10-CM

## 2023-09-15 DIAGNOSIS — K219 Gastro-esophageal reflux disease without esophagitis: Secondary | ICD-10-CM

## 2023-09-15 DIAGNOSIS — Z950 Presence of cardiac pacemaker: Secondary | ICD-10-CM

## 2023-09-15 LAB — CBC WITH DIFFERENTIAL/PLATELET
Absolute Lymphocytes: 1800 {cells}/uL (ref 850–3900)
Absolute Monocytes: 760 {cells}/uL (ref 200–950)
Basophils Absolute: 50 {cells}/uL (ref 0–200)
Basophils Relative: 0.5 %
Eosinophils Absolute: 120 {cells}/uL (ref 15–500)
Eosinophils Relative: 1.2 %
HCT: 28.2 % — ABNORMAL LOW (ref 38.5–50.0)
Hemoglobin: 9.3 g/dL — ABNORMAL LOW (ref 13.2–17.1)
MCH: 32.9 pg (ref 27.0–33.0)
MCHC: 33 g/dL (ref 32.0–36.0)
MCV: 99.6 fL (ref 80.0–100.0)
MPV: 10.6 fL (ref 7.5–12.5)
Monocytes Relative: 7.6 %
Neutro Abs: 7270 {cells}/uL (ref 1500–7800)
Neutrophils Relative %: 72.7 %
Platelets: 294 Thousand/uL (ref 140–400)
RBC: 2.83 Million/uL — ABNORMAL LOW (ref 4.20–5.80)
RDW: 14.1 % (ref 11.0–15.0)
Total Lymphocyte: 18 %
WBC: 10 Thousand/uL (ref 3.8–10.8)

## 2023-09-15 LAB — IRON,TIBC AND FERRITIN PANEL
%SAT: 10 % — ABNORMAL LOW (ref 20–48)
Ferritin: 192 ng/mL (ref 24–380)
Iron: 25 ug/dL — ABNORMAL LOW (ref 50–180)
TIBC: 248 ug/dL — ABNORMAL LOW (ref 250–425)

## 2023-09-15 LAB — B12 AND FOLATE PANEL
Folate: 9.6 ng/mL
Vitamin B-12: 1040 pg/mL (ref 200–1100)

## 2023-09-15 LAB — TSH+FREE T4: TSH W/REFLEX TO FT4: 3.72 m[IU]/L (ref 0.40–4.50)

## 2023-09-15 LAB — RETICULOCYTES
ABS Retic: 70750 {cells}/uL (ref 25000–90000)
Retic Ct Pct: 2.5 %

## 2023-09-15 LAB — PSA: PSA: 0.34 ng/mL (ref <=4.00)

## 2023-09-15 NOTE — Progress Notes (Signed)
 Got all week next week yes yeah possibly flip-flopped denies that she is this week and we got   New Patient Visit   Patient: Raymond Bennett.   DOB: 06/24/1945   78 y.o. Male  MRN: [id number] Visit Date: 09/15/2023  Today's healthcare provider: Parris DELENA Juneau, MD   Chief Complaint  Patient presents with   Establish Care   Subjective    Raymond Bennett. is a 78 y.o. male who presents today as a new patient to establish care.   HPI  Patient presents to establish care.  He has a complex medical history with severe coronary artery disease with stent placement at [facility] s/p DES to LAD/D1 bifurcation (06/22/2023 with Dr. 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  Stent Placement after Worsening Angina Symptoms.    Patient reports that in hospital he had a code with loss of consciousness and resuscitation performed prior to stent placement.  Patient also had anemia in the hospital with a hemoglobin on July 4 of 7.6.  He does reported in the hospital he was given a unit of blood.  Patient mains on clopidogrel  75 mg tablets, daily aspirin  81 mg tablets, metoprolol  25 mg XL daily.  Blood pressure in office trending in the lower ranges currently 108/52 but historically has been lower than that.  He denies shortness of breath, chest pain.  No palpitations.  He feels that his energy is improved since stent placement.  He has been referred to cardiac rehab.  Patient also has a history of cardiomyopathy with last ejection fraction of 35 to 40% status post Adriamycin chemotherapy in 1993 and again in 1995 when he was treated for non-Hodgkin's lymphoma.  Patient was treated also with stem cell transplant.   This has remained in remission.  Patient has a history of GERD for which she takes omeprazole 40 mg daily.  Symptoms well-controlled  Patient with a history of 40% bilateral carotid stenosis.  History of hypertension for which he takes lisinopril  10 mg daily.  Patient has a history of renal cell carcinoma with resection of the kidney.  He reports that this has led to resultant chronic kidney disease.  Patient is CKD stage III with a baseline creatinine of 1.5 and a GFR of 44.  This has been stable for his last labs.  He is non-smoker and without alcohol or drug use.  He reports overall healthy diet.  He has a history of multiple basal cell skin lesions and follows with dermatology on a regular basis.  Assessment & Plan   #1 Anemia.  Patient needs repeat CBC with related labs today to follow-up on hemoglobin.  Underlying cause is not entirely clear.  He does take oral iron on a regular basis.  Does not report any change in stool or abdominal pain.  It seems he was referred to hematology from [facility]. Follow-up with may have a combination of CKD induced ACD anemia.  Needs close follow-up we will put him in for an appointment for next week and will watch closely for lab results.  #2 history of basal cell recurrent.  He will need ongoing monitoring and follow-up.  Continue to follow with dermatology.  3.  Coronary artery disease. - He has a follow-up with  cardiology next week.  Should be seen in the ED if any chest pain shortness of breath dizziness lightheaded or numbness or recurrent symptoms.  Discussed with patient he should not wait at home if he has new symptoms.  He remains on daily aspirin  81 mg clopidogrel  75 mg.  If he does have Imdur 30 mg at home defer as needed use.  Remains on metoprolol  25 mg and rosuvastatin  20 mg daily.  4. Cardiomyopathy.  He remains on spironolactone  25 mg.  Ongoing surveillance as per cardiology.  Thought to be chemotherapy induced.  Patient with HFrEF per  cardiology notes.  5. Chronic kidney disease stage III.  He is at baseline.  This needs ongoing follow-up and surveillance.  Will check BMP with CBC.  6.  Carotid stenosis.  Patient has bilateral ICA stenosis at 40%.  He should remain on dual antiplatelet therapy.  Statin.  7.  Patient has history of pacemaker placement.  Ongoing surveillance as per cardiology.  This was due to history of sick sinus syndrome.  Overall today the most important issue for this patient is to complete a repeat CBC and resolution of 100 not lying anemia which would exacerbate his coronary artery disease.  He has follow-up for next week.  >1 hour spent time between chart review, appt and tx planning.    Objective    BP (!) 108/52 (BP Location: Left Arm, Patient Position: Sitting, Cuff Size: Normal)   Pulse 86   Ht 5' 8 (1.727 m)   Wt 178 lb 4 oz (80.9 kg)   SpO2 97%   BMI 27.10 kg/m      Review of Systems  Constitutional:  Negative for chills, fever and weight loss.  Eyes:  Negative for blurred vision.  Respiratory:  Negative for cough and shortness of breath.   Cardiovascular:  Negative for chest pain and palpitations.  Skin:  Negative for rash.  Psychiatric/Behavioral:  Negative for depression. The patient is not nervous/anxious.      Physical Exam  Physical Exam Vitals reviewed.  Constitutional:      Appearance: Normal appearance. Well-developed with normal weight.  HENT:     Head: Normocephalic and atraumatic.  Normal mucous membranes, no oral lesions Eyes:     Pupils: Pupils are equal, round, and reactive to light.  Neck:     Thyroid: No thyroid mass or thyromegaly.  Cardiovascular:     Rate and Rhythm: Normal rate and regular rhythm. Normal heart sounds. Normal peripheral pulses Pulmonary:     Normal breath sounds with normal effort Abdominal:   Abdomen is soft, without tenderness or noted hepatosplenomegaly Musculoskeletal:        General: No swelling or edema   Lymphadenopathy:     Cervical: No cervical adenopathy.  Skin:    General: Skin is warm and dry without noticeable rash. Neurological:     General: No focal deficit present.  Psychiatric:        Mood and Affect: Mood, behavior and cognition normal     Past Medical History:  Diagnosis Date   Anemia    Basal cell carcinoma 01/17/2014   L lateral top of shoulder ant, L ant top of shoulder post   Basal cell carcinoma 01/01/2016   L mid back   Basal cell carcinoma 11/17/2017   R mid ear helix   Basal cell carcinoma 02/06/2019   L scalp/temple area within the hair above the sideburn/excision   Basal cell carcinoma 08/15/2019   Right chest medial infraclavicular.  Superficial and nodular.    Basal cell carcinoma 08/15/2019   Left chest parasternal. Superficial.   Basal cell carcinoma 02/14/2020   L thigh above knee    Basal cell carcinoma 10/20/2022   R lat neck, EDC   Cancer (HCC)    Chronic kidney disease    renal cell carcinoma   Coronary artery disease    GERD (gastroesophageal reflux disease)    History of colon polyps    History of gastric polyp    History of hiatal hernia    Neuropathy    Non Hodgkin's lymphoma (HCC)    PONV (postoperative nausea and vomiting)    difficulty waking up after surgery   Reflux    Renal mass    Past Surgical History:  Procedure Laterality Date   BONE MARROW TRANSPLANT     stem cell transplant at [facility] 30 years ago   CHOLECYSTECTOMY     COLONOSCOPY WITH PROPOFOL  N/A 02/11/2017   Procedure: COLONOSCOPY WITH PROPOFOL ;  Surgeon: Viktoria Lamar DASEN, MD;  Location: [facility] ENDOSCOPY;  Service: Endoscopy;  Laterality: N/A;   CORONARY STENT INTERVENTION N/A 06/22/2023   Procedure: CORONARY STENT INTERVENTION;  Surgeon: Katina Albright, MD;  Location: [facility] INVASIVE CV LAB;  Service: Cardiovascular;  Laterality: N/A;   CORONARY ULTRASOUND/IVUS N/A 06/22/2023   Procedure: Coronary Ultrasound/IVUS;  Surgeon: Katina Albright, MD;  Location: [facility] INVASIVE CV  LAB;  Service: Cardiovascular;  Laterality: N/A;   ESOPHAGOGASTRODUODENOSCOPY (EGD) WITH PROPOFOL  N/A 02/11/2017   Procedure: ESOPHAGOGASTRODUODENOSCOPY (EGD) WITH PROPOFOL ;  Surgeon: Viktoria Lamar DASEN, MD;  Location: [facility] ENDOSCOPY;  Service: Endoscopy;  Laterality: N/A;   FRACTURE SURGERY     GALLBLADDER SURGERY     HERNIA REPAIR Left    Inguinal Hernia Repair   LEFT HEART CATH AND CORONARY ANGIOGRAPHY Left 06/22/2023   Procedure: LEFT HEART CATH AND CORONARY ANGIOGRAPHY;  Surgeon: Katina Albright, MD;  Location: [facility] INVASIVE CV LAB;  Service: Cardiovascular;  Laterality: Left;   LEFT HEART CATH AND CORONARY ANGIOGRAPHY N/A 09/07/2023   Procedure: LEFT HEART CATH AND CORONARY ANGIOGRAPHY;  Surgeon: Katina Albright, MD;  Location: [facility] INVASIVE CV LAB;  Service: Cardiovascular;  Laterality: N/A;   PACEMAKER LEADLESS INSERTION N/A 11/02/2022   Procedure: PACEMAKER LEADLESS INSERTION;  Surgeon: Ammon Blunt, MD;  Location: [facility] INVASIVE CV LAB;  Service: Cardiovascular;  Laterality: N/A;   ROBOTIC ASSITED PARTIAL NEPHRECTOMY Left 11/12/2015   Procedure: ROBOTIC ASSITED PARTIAL NEPHRECTOMY;  Surgeon: Redell Lynwood Napoleon, MD;  Location: [facility] ORS;  Service: Urology;  Laterality: Left;   TONSILLECTOMY     Family Status  Relation Name Status   Mother  Deceased   Father  Deceased  No partnership data on file   Family History  Problem Relation Age of Onset   Breast cancer Mother    Heart attack Father    Social History   Socioeconomic History   Marital status: Married    Spouse name: Erminio   Number of children: 0   Years of education: Not on file   Highest education level: Bachelor's degree (e.g., BA, AB, BS)  Occupational History   Not on file  Tobacco Use   Smoking status: Never   Smokeless tobacco: Never  Vaping Use   Vaping status: Never Used  Substance and Sexual Activity   Alcohol use: No   Drug use: No   Sexual activity: Not on file  Other Topics Concern   Not on file   Social History Narrative   Lives at home with wife  Social Drivers of Corporate investment banker Strain: Low Risk  (09/12/2023)   Overall Financial Resource Strain (CARDIA)    Difficulty of Paying Living Expenses: Not hard at all  Food Insecurity: No Food Insecurity (09/12/2023)   Hunger Vital Sign    Worried About Running Out of Food in the Last Year: Never true    Ran Out of Food in the Last Year: Never true  Transportation Needs: No Transportation Needs (09/12/2023)   PRAPARE - Administrator, Civil Service (Medical): No    Lack of Transportation (Non-Medical): No  Physical Activity: Sufficiently Active (09/12/2023)   Exercise Vital Sign    Days of Exercise per Week: 7 days    Minutes of Exercise per Session: 30 min  Stress: No Stress Concern Present (09/12/2023)   [employer] of Occupational Health - Occupational Stress Questionnaire    Feeling of Stress: Not at all  Social Connections: Moderately Isolated (09/12/2023)   Social Connection and Isolation Panel    Frequency of Communication with Friends and Family: Once a week    Frequency of Social Gatherings with Friends and Family: Once a week    Attends Religious Services: 1 to 4 times per year    Active Member of [employer] or Organizations: No    Attends Engineer, structural: Not on file    Marital Status: Married   Outpatient Medications Prior to Visit  Medication Sig   aspirin  EC 81 MG tablet Take 1 tablet (81 mg total) by mouth daily. Swallow whole.   Cholecalciferol  25 MCG (1000 UT) tablet Take 1,000 Units by mouth daily.   clopidogrel  (PLAVIX ) 75 MG tablet Take 1 tablet (75 mg total) by mouth daily.   empagliflozin  (JARDIANCE ) 10 MG TABS tablet Take 1 tablet by mouth daily.   ferrous sulfate  325 (65 FE) MG tablet Take 325 mg by mouth in the morning.   finasteride  (PROPECIA ) 1 MG tablet Take 1 tablet by mouth every morning.   magnesium  oxide (MAG-OX) 400 MG tablet Take 400 mg by mouth daily.    Multiple Vitamins-Minerals (PRESERVISION AREDS 2) CAPS Take 1 capsule by mouth 2 (two) times daily.   nitroGLYCERIN  (NITROSTAT ) 0.4 MG SL tablet Place 0.4 mg under the tongue every 5 (five) minutes x 3 doses as needed for chest pain.   omeprazole (PRILOSEC) 40 MG capsule Take 40 mg by mouth daily before breakfast. Reported on 08/06/2015   Probiotic Product (PROBIOTIC PO) Take 1 capsule by mouth daily.   rosuvastatin  (CRESTOR ) 20 MG tablet Take 20 mg by mouth daily.   spironolactone  (ALDACTONE ) 25 MG tablet Take 25 mg by mouth daily.   vitamin B-12 (CYANOCOBALAMIN ) 1000 MCG tablet Take 1,000 mcg by mouth in the morning.   isosorbide mononitrate (IMDUR) 30 MG 24 hr tablet Take 30 mg by mouth daily. (Patient not taking: Reported on 09/15/2023)   metoprolol  tartrate (LOPRESSOR ) 25 MG tablet Take 12.5 mg by mouth 2 (two) times daily. (Patient not taking: Reported on 09/15/2023)   No facility-administered medications prior to visit.   Allergies  Allergen Reactions   Beef-Derived Drug Products Nausea And Vomiting    Immunization History  Administered Date(s) Administered   PFIZER(Purple Top)SARS-COV-2 Vaccination 12/31/2019    Health Maintenance  Topic Date Due   Medicare Annual Wellness (AWV)  Never done   Hepatitis C Screening  Never done   DTaP/Tdap/Td (1 - Tdap) Never done   Pneumococcal Vaccine: 50+ Years (1 of 2 - PCV) Never done  Zoster Vaccines- Shingrix (1 of 2) Never done   COVID-19 Vaccine (2 - Pfizer risk series) 01/21/2020   INFLUENZA VACCINE  10/07/2023   Hepatitis B Vaccines  Aged Out   HPV VACCINES  Aged Out   Meningococcal B Vaccine  Aged Out   Colonoscopy  Discontinued    Patient Care Team: Corlis Honor BROCKS, MD as PCP - General (Internal Medicine)      Depression Screen    07/06/2023   11:54 AM  PHQ 2/9 Scores  PHQ - 2 Score 0  PHQ- 9 Score 3      Parris DELENA Juneau, MD  [facility] [phone] (phone) [phone]  (fax)  [facility]

## 2023-09-16 ENCOUNTER — Ambulatory Visit (HOSPITAL_BASED_OUTPATIENT_CLINIC_OR_DEPARTMENT_OTHER): Payer: Self-pay

## 2023-09-20 ENCOUNTER — Ambulatory Visit

## 2023-09-21 ENCOUNTER — Ambulatory Visit

## 2023-09-21 VITALS — BP 84/40 | HR 83 | Ht 68.0 in | Wt 178.0 lb

## 2023-09-21 DIAGNOSIS — I2510 Atherosclerotic heart disease of native coronary artery without angina pectoris: Secondary | ICD-10-CM

## 2023-09-21 DIAGNOSIS — D631 Anemia in chronic kidney disease: Secondary | ICD-10-CM

## 2023-09-21 DIAGNOSIS — I10 Essential (primary) hypertension: Secondary | ICD-10-CM

## 2023-09-21 DIAGNOSIS — N1832 Chronic kidney disease, stage 3b: Secondary | ICD-10-CM | POA: Diagnosis not present

## 2023-09-21 DIAGNOSIS — E611 Iron deficiency: Secondary | ICD-10-CM | POA: Diagnosis not present

## 2023-09-21 NOTE — Assessment & Plan Note (Signed)
 Blood pressure low.  He has been ordered to hold lisinopril .  Check blood pressure in the morning if less than 100 systolic hold spironolactone .  Will have to communicate with cardiology if this is the case.  He will call in the morning.

## 2023-09-21 NOTE — Assessment & Plan Note (Addendum)
 Patient is at baselineI with a creatinine of 1.59 and a GFR of 44.  Will recheck in 1 month.  He is taking iron supplementation.  We will recheck iron in 1 month.  If he still remains low possible hematology.  Suspect some component of anemia with chronic kidney disease as well as anemia of chronic disease.  Furthermore he recently had acute blood loss.

## 2023-09-21 NOTE — Assessment & Plan Note (Signed)
 Patient is status post stent placement.  Continue with Plavix  75 mg tablets, daily aspirin  81mg  tablets and rosuvastatin  20 mg tablets.  He should remain on metoprolol  and for now spironolactone  unless blood pressure is low.

## 2023-09-21 NOTE — Progress Notes (Unsigned)
 HPI: Raymond Bennett. is a 78 y.o. male presenting on 09/21/2023 for Medical Management of Chronic Issues (Cardiology wants patient to have a BMP) .   New complaints:  Patient here to review labs.  He had basic labs done including iron.  He feels better and has no chest pain.  He denies shortness of breath.  No dizziness or lightheadedness despite his low blood pressure today of 84/40.  He was seen by cardiology earlier today.  He feels that clinically he is doing much better and has had big improvement of energy over the last few days.  Social history:  Relevant past medical, surgical, family and social history reviewed and updated as indicated. Interim medical history since our last visit reviewed.  Allergies and medications reviewed and updated.  DATA REVIEWED: CHART IN EPIC  ROS: Negative unless specifically indicated above in HPI.    Current Outpatient Medications:    aspirin  EC 81 MG tablet, Take 1 tablet (81 mg total) by mouth daily. Swallow whole., Disp: , Rfl:    Cholecalciferol  25 MCG (1000 UT) tablet, Take 1,000 Units by mouth daily., Disp: , Rfl:    clopidogrel  (PLAVIX ) 75 MG tablet, Take 1 tablet (75 mg total) by mouth daily., Disp: 30 tablet, Rfl: 0   empagliflozin  (JARDIANCE ) 10 MG TABS tablet, Take 1 tablet by mouth daily., Disp: , Rfl:    ferrous sulfate  325 (65 FE) MG tablet, Take 325 mg by mouth in the morning., Disp: , Rfl:    finasteride  (PROPECIA ) 1 MG tablet, Take 1 tablet by mouth every morning., Disp: , Rfl:    magnesium  oxide (MAG-OX) 400 MG tablet, Take 400 mg by mouth daily., Disp: , Rfl:    Multiple Vitamins-Minerals (PRESERVISION AREDS 2) CAPS, Take 1 capsule by mouth 2 (two) times daily., Disp: , Rfl:    nitroGLYCERIN  (NITROSTAT ) 0.4 MG SL tablet, Place 0.4 mg under the tongue every 5 (five) minutes x 3 doses as needed for chest pain., Disp: , Rfl:    omeprazole (PRILOSEC) 40 MG capsule, Take 40 mg by mouth daily before breakfast. Reported on 08/06/2015,  Disp: , Rfl:    Probiotic Product (PROBIOTIC PO), Take 1 capsule by mouth daily., Disp: , Rfl:    rosuvastatin  (CRESTOR ) 20 MG tablet, Take 20 mg by mouth daily., Disp: , Rfl:    spironolactone  (ALDACTONE ) 25 MG tablet, Take 25 mg by mouth daily., Disp: , Rfl:    vitamin B-12 (CYANOCOBALAMIN ) 1000 MCG tablet, Take 1,000 mcg by mouth in the morning., Disp: , Rfl:       Objective:    BP (!) 84/40   Pulse 83   Ht 5' 8 (1.727 m)   Wt 178 lb (80.7 kg)   SpO2 99%   BMI 27.06 kg/m   Wt Readings from Last 3 Encounters:  09/21/23 178 lb (80.7 kg)  09/15/23 178 lb 4 oz (80.9 kg)  09/07/23 179 lb 0.2 oz (81.2 kg)    Physical Exam  Physical Exam Vitals reviewed.  Constitutional:      Appearance: Normal appearance. Well-developed with normal weight.  Cardiovascular:     Rate and Rhythm: Normal rate and regular rhythm. Normal heart sounds. Normal peripheral pulses Pulmonary:     Normal breath sounds with normal effort Skin:    General: Skin is warm and dry without noticeable rash. Neurological:     General: No focal deficit present.  Psychiatric:        Mood and Affect: Mood, behavior and  cognition normal     Assessment & Plan:  Anemia in stage 3b chronic kidney disease (HCC) Assessment & Plan: Patient is at baselineI with a creatinine of 1.59 and a GFR of 44.  Will recheck in 1 month.  He is taking iron supplementation.  We will recheck iron in 1 month.  If he still remains low possible hematology.  Suspect some component of anemia with chronic kidney disease as well as anemia of chronic disease.  Furthermore he recently had acute blood loss.  Orders: -     Iron, TIBC and Ferritin Panel -     CBC with Differential/Platelet  Essential hypertension Assessment & Plan: Blood pressure low.  He has been ordered to hold lisinopril .  Check blood pressure in the morning if less than 100 systolic hold spironolactone .  Will have to communicate with cardiology if this is the case.  He will  call in the morning.   Iron deficiency -     Iron, TIBC and Ferritin Panel -     CBC with Differential/Platelet  Stage 3b chronic kidney disease (HCC) -     Iron, TIBC and Ferritin Panel -     CBC with Differential/Platelet  Coronary artery disease involving native coronary artery of native heart without angina pectoris Assessment & Plan: Patient is status post stent placement.  Continue with Plavix  75 mg tablets, daily aspirin  81mg  tablets and rosuvastatin  20 mg tablets.  He should remain on metoprolol  and for now spironolactone  unless blood pressure is low.      Return in about 4 weeks (around 10/19/2023).  At next visit we will review repeat labs and decide further planning.

## 2023-09-22 ENCOUNTER — Ambulatory Visit

## 2023-09-27 ENCOUNTER — Ambulatory Visit

## 2023-09-29 ENCOUNTER — Ambulatory Visit

## 2023-10-03 ENCOUNTER — Telehealth: Payer: Self-pay

## 2023-10-03 ENCOUNTER — Other Ambulatory Visit: Payer: Self-pay

## 2023-10-03 NOTE — Telephone Encounter (Signed)
 Labs are in his chart, he is schedule for 8/18. Does this need to be sooner?

## 2023-10-03 NOTE — Telephone Encounter (Signed)
 Left message for patient to return call OK to advise  and move appointment if he would like

## 2023-10-04 ENCOUNTER — Ambulatory Visit

## 2023-10-04 NOTE — Telephone Encounter (Signed)
 Pt returning Julian call

## 2023-10-04 NOTE — Telephone Encounter (Signed)
 Spoke with patient, he will not move his appointment. Understands when he comes on 8/18 his labs will be reviewed.

## 2023-10-05 NOTE — Procedures (Signed)
 Pacer Clinic Monitoring. See chart review for scanned document of complete results and uploaded device report.

## 2023-10-06 ENCOUNTER — Ambulatory Visit

## 2023-10-11 ENCOUNTER — Ambulatory Visit

## 2023-10-13 ENCOUNTER — Ambulatory Visit

## 2023-10-18 ENCOUNTER — Ambulatory Visit

## 2023-10-20 ENCOUNTER — Ambulatory Visit

## 2023-10-24 ENCOUNTER — Ambulatory Visit

## 2023-10-25 ENCOUNTER — Ambulatory Visit

## 2023-10-27 ENCOUNTER — Ambulatory Visit

## 2023-10-31 ENCOUNTER — Other Ambulatory Visit: Payer: Self-pay

## 2023-10-31 ENCOUNTER — Ambulatory Visit

## 2023-10-31 VITALS — BP 100/60 | HR 78 | Ht 68.0 in | Wt 179.1 lb

## 2023-10-31 DIAGNOSIS — E611 Iron deficiency: Secondary | ICD-10-CM | POA: Diagnosis not present

## 2023-10-31 DIAGNOSIS — I10 Essential (primary) hypertension: Secondary | ICD-10-CM

## 2023-10-31 DIAGNOSIS — I2510 Atherosclerotic heart disease of native coronary artery without angina pectoris: Secondary | ICD-10-CM

## 2023-10-31 DIAGNOSIS — N1832 Chronic kidney disease, stage 3b: Secondary | ICD-10-CM

## 2023-10-31 DIAGNOSIS — D631 Anemia in chronic kidney disease: Secondary | ICD-10-CM

## 2023-10-31 NOTE — Progress Notes (Signed)
 Progress Note  Physician: Zylee Marchiano A Khale Nigh, MD   HPI: Raymond Bennett. is a 78 y.o. male presenting on 10/31/2023 for Follow-up .  D History of Present Illness         Patient seen in follow-up.  He had been having some lower blood pressures which have resolved with discontinuation of spironolactone .  He did see cardiology more recently and is generally stable.  His blood pressures at home are usually in the 100 systolic with diastolic levels in the 50s.  More recent labs showing iron deficiency and a hemoglobin of 9.  The patient denies any chest pain or shortness of breath.  He finds that energy levels are slowly improving.  He is having some days with fatigue and dizziness but this is transient.  Symptoms superimposed on anemia of chronic disease, iron deficiency and ischemic cardiomyopathy with CAD     Medical history:  Relevant past medical, surgical, family and social history reviewed and updated as indicated. Interim medical history since our last visit reviewed.  Allergies and medications reviewed and updated.   ROS: Negative unless specifically indicated above in HPI.    Current Outpatient Medications:    aspirin  EC 81 MG tablet, Take 1 tablet (81 mg total) by mouth daily. Swallow whole., Disp: , Rfl:    Cholecalciferol  25 MCG (1000 UT) tablet, Take 1,000 Units by mouth daily., Disp: , Rfl:    clopidogrel  (PLAVIX ) 75 MG tablet, Take 1 tablet (75 mg total) by mouth daily., Disp: 30 tablet, Rfl: 0   empagliflozin  (JARDIANCE ) 10 MG TABS tablet, Take 1 tablet by mouth daily., Disp: , Rfl:    ferrous sulfate  325 (65 FE) MG tablet, Take 325 mg by mouth in the morning., Disp: , Rfl:    finasteride  (PROPECIA ) 1 MG tablet, Take 1 tablet by mouth every morning., Disp: , Rfl:    magnesium  oxide (MAG-OX) 400 MG tablet, Take 400 mg by mouth daily., Disp: , Rfl:    metoprolol  succinate (TOPROL -XL) 25 MG 24 hr tablet, Take 25 mg by mouth daily., Disp: , Rfl:    Multiple  Vitamins-Minerals (PRESERVISION AREDS 2) CAPS, Take 1 capsule by mouth 2 (two) times daily., Disp: , Rfl:    omeprazole (PRILOSEC) 40 MG capsule, Take 40 mg by mouth daily before breakfast. Reported on 08/06/2015, Disp: , Rfl:    Probiotic Product (PROBIOTIC PO), Take 1 capsule by mouth daily., Disp: , Rfl:    rosuvastatin  (CRESTOR ) 20 MG tablet, Take 20 mg by mouth daily., Disp: , Rfl:    vitamin B-12 (CYANOCOBALAMIN ) 1000 MCG tablet, Take 1,000 mcg by mouth in the morning., Disp: , Rfl:    nitroGLYCERIN  (NITROSTAT ) 0.4 MG SL tablet, Place 0.4 mg under the tongue every 5 (five) minutes x 3 doses as needed for chest pain., Disp: , Rfl:        Objective:     BP 100/60 (BP Location: Right Arm, Patient Position: Sitting, Cuff Size: Normal)   Pulse 78   Ht 5' 8 (1.727 m)   Wt 179 lb 2 oz (81.3 kg)   SpO2 98%   BMI 27.24 kg/m   Wt Readings from Last 3 Encounters:  10/31/23 179 lb 2 oz (81.3 kg)  09/21/23 178 lb (80.7 kg)  09/15/23 178 lb 4 oz (80.9 kg)    Physical Exam  Physical Exam Vitals reviewed.  Constitutional:      Appearance: Normal appearance. Well-developed with normal  weight.  Cardiovascular:     Rate and Rhythm: Normal rate and regular rhythm. Normal heart sounds. Normal peripheral pulses Pulmonary:     Normal breath sounds with normal effort Skin:    General: Skin is warm and dry without noticeable rash. Neurological:     General: No focal deficit present.  Psychiatric:        Mood and Affect: Mood, behavior and cognition normal      Assessment & Plan:   Encounter Diagnoses  Name Primary?   Anemia in stage 3b chronic kidney disease (HCC) Yes   Iron deficiency    Essential hypertension     No orders of the defined types were placed in this encounter.    Assessment and Plan     #1 iron deficiency.  He is taking oral iron and tolerating fine.  Will recheck his iron towards next month.  Repeat hemoglobin as well.  I would like to see improvement and if not  IV iron would be appropriate for him.  Hemoglobin goal of at least 10.  2.  Anemia.  Resulting his anemia would be beneficial for renal and cardiac function overall.  3.  Chronic kidney disease.  Will check kidney function markers.  Patient to check blood pressure at home.  Monitor weight.  Follow-up in case of new symptoms.  Recommend influenza vaccine.

## 2023-11-01 ENCOUNTER — Ambulatory Visit

## 2023-11-03 ENCOUNTER — Ambulatory Visit

## 2023-11-16 ENCOUNTER — Other Ambulatory Visit
Admission: RE | Admit: 2023-11-16 | Discharge: 2023-11-16 | Disposition: A | Discharge status: Home / Self Care | Source: Ambulatory Visit

## 2023-11-16 DIAGNOSIS — D631 Anemia in chronic kidney disease: Secondary | ICD-10-CM | POA: Diagnosis not present

## 2023-11-16 DIAGNOSIS — I2510 Atherosclerotic heart disease of native coronary artery without angina pectoris: Secondary | ICD-10-CM | POA: Diagnosis not present

## 2023-11-16 DIAGNOSIS — N1832 Chronic kidney disease, stage 3b: Secondary | ICD-10-CM | POA: Insufficient documentation

## 2023-11-16 DIAGNOSIS — I129 Hypertensive chronic kidney disease with stage 1 through stage 4 chronic kidney disease, or unspecified chronic kidney disease: Secondary | ICD-10-CM | POA: Diagnosis not present

## 2023-11-16 LAB — FERRITIN: Ferritin: 87 ng/mL (ref 24–336)

## 2023-11-16 LAB — COMPREHENSIVE METABOLIC PANEL WITH GFR
ALT: 13 U/L (ref 0–44)
AST: 17 U/L (ref 15–41)
Albumin: 3.7 g/dL (ref 3.5–5.0)
Alkaline Phosphatase: 55 U/L (ref 38–126)
Anion gap: 12 (ref 5–15)
BUN: 29 mg/dL — ABNORMAL HIGH (ref 8–23)
CO2: 25 mmol/L (ref 22–32)
Calcium: 9.1 mg/dL (ref 8.9–10.3)
Chloride: 100 mmol/L (ref 98–111)
Creatinine, Ser: 1.72 mg/dL — ABNORMAL HIGH (ref 0.61–1.24)
GFR, Estimated: 40 mL/min — ABNORMAL LOW (ref >60)
Glucose, Bld: 112 mg/dL — ABNORMAL HIGH (ref 70–99)
Potassium: 4.4 mmol/L (ref 3.5–5.1)
Sodium: 137 mmol/L (ref 135–145)
Total Bilirubin: 0.7 mg/dL (ref 0.0–1.2)
Total Protein: 6.8 g/dL (ref 6.5–8.1)

## 2023-11-16 LAB — CBC WITH DIFFERENTIAL/PLATELET
Abs Immature Granulocytes: 0.04 K/uL (ref 0.00–0.07)
Basophils Absolute: 0 K/uL (ref 0.0–0.1)
Basophils Relative: 0 %
Eosinophils Absolute: 0.1 K/uL (ref 0.0–0.5)
Eosinophils Relative: 1 %
HCT: 34.3 % — ABNORMAL LOW (ref 39.0–52.0)
Hemoglobin: 10.8 g/dL — ABNORMAL LOW (ref 13.0–17.0)
Immature Granulocytes: 0 %
Lymphocytes Relative: 17 %
Lymphs Abs: 1.6 K/uL (ref 0.7–4.0)
MCH: 30.1 pg (ref 26.0–34.0)
MCHC: 31.5 g/dL (ref 30.0–36.0)
MCV: 95.5 fL (ref 80.0–100.0)
Monocytes Absolute: 0.6 K/uL (ref 0.1–1.0)
Monocytes Relative: 7 %
Neutro Abs: 6.9 K/uL (ref 1.7–7.7)
Neutrophils Relative %: 75 %
Platelets: 213 K/uL (ref 150–400)
RBC: 3.59 MIL/uL — ABNORMAL LOW (ref 4.22–5.81)
RDW: 14.6 % (ref 11.5–15.5)
WBC: 9.2 K/uL (ref 4.0–10.5)
nRBC: 0 % (ref 0.0–0.2)

## 2023-11-16 LAB — IRON AND TIBC
Iron: 43 ug/dL — ABNORMAL LOW (ref 45–182)
Saturation Ratios: 17 % — ABNORMAL LOW (ref 17.9–39.5)
TIBC: 249 ug/dL — ABNORMAL LOW (ref 250–450)
UIBC: 206 ug/dL

## 2023-11-23 ENCOUNTER — Ambulatory Visit

## 2023-11-23 VITALS — BP 110/58 | HR 59 | Ht 68.0 in | Wt 179.2 lb

## 2023-11-23 DIAGNOSIS — I255 Ischemic cardiomyopathy: Secondary | ICD-10-CM

## 2023-11-23 DIAGNOSIS — N1832 Chronic kidney disease, stage 3b: Secondary | ICD-10-CM

## 2023-11-23 DIAGNOSIS — I2510 Atherosclerotic heart disease of native coronary artery without angina pectoris: Secondary | ICD-10-CM

## 2023-11-23 DIAGNOSIS — I10 Essential (primary) hypertension: Secondary | ICD-10-CM

## 2023-11-23 DIAGNOSIS — D631 Anemia in chronic kidney disease: Secondary | ICD-10-CM

## 2023-11-23 DIAGNOSIS — K219 Gastro-esophageal reflux disease without esophagitis: Secondary | ICD-10-CM | POA: Diagnosis not present

## 2023-11-23 NOTE — Progress Notes (Signed)
 Progress Note  Physician: Sahid Borba A Savita Runner, MD   HPI: Raymond Arther. is a 78 y.o. male presenting on 11/23/2023 for Medical Management of Chronic Issues .  Raymond Bennett is a 78 y.o.male patient who presents for a 3 month follow up. PMH significant for pacemaker, AV block, fatigue, SOB, cardiomyopathy, CKD, GERD, obesity, chest pain, hx of non-Hodgkin's lymphoma.   Discussed the use of AI scribe software for clinical note transcription with the patient, who gave verbal consent to proceed.  History of Present Illness   Raymond Wilmes. is an 78 year old male who presents for follow-up on his kidney function and blood work.  Renal function and laboratory findings - Recent increase in creatinine levels on blood work 1.59-->1.72, GFR 44-->40  - History of RCC - No recent follow-up with nephrology in the past year due to focus on cardiology care  - on empaglaflozin  Anemia - Hemoglobin improved from 9.3 to 10.8.  Supplementing iron  - No SOB, lightheadedness or dizziness  Antihypertensive therapy and blood pressure monitoring - Started on losartan  - Monitors blood pressure daily, ranging between 100 and 120 mmHg - No dizziness or lightheadedness  Exercise tolerance and cardiopulmonary symptoms - Improved energy levels - Able to walk further each day - No chest pain or shortness of breath unless walking excessively  - Hemoglobin A1c stable  General well-being and weight management - Sleeping more than usual, attributed to recovery from recent illness - Eating well - Desires weight loss        Medical history: - Relevant past medical, surgical, family and social history reviewed and updated as indicated. Interim medical history since our last visit reviewed.  Allergies and medications reviewed and updated.   ROS: Negative unless specifically indicated above in HPI.    Current Outpatient Medications:    aspirin  EC 81 MG tablet, Take 1 tablet (81 mg total)  by mouth daily. Swallow whole., Disp: , Rfl:    Cholecalciferol  25 MCG (1000 UT) tablet, Take 1,000 Units by mouth daily., Disp: , Rfl:    clopidogrel  (PLAVIX ) 75 MG tablet, Take 1 tablet (75 mg total) by mouth daily., Disp: 30 tablet, Rfl: 0   empagliflozin  (JARDIANCE ) 10 MG TABS tablet, Take 1 tablet by mouth daily., Disp: , Rfl:    ferrous sulfate  325 (65 FE) MG tablet, Take 325 mg by mouth in the morning., Disp: , Rfl:    finasteride  (PROPECIA ) 1 MG tablet, Take 1 tablet by mouth every morning., Disp: , Rfl:    magnesium  oxide (MAG-OX) 400 MG tablet, Take 400 mg by mouth daily., Disp: , Rfl:    metoprolol  succinate (TOPROL -XL) 25 MG 24 hr tablet, Take 25 mg by mouth daily., Disp: , Rfl:    Multiple Vitamins-Minerals (PRESERVISION AREDS 2) CAPS, Take 1 capsule by mouth 2 (two) times daily., Disp: , Rfl:    nitroGLYCERIN  (NITROSTAT ) 0.4 MG SL tablet, Place 0.4 mg under the tongue every 5 (five) minutes x 3 doses as needed for chest pain., Disp: , Rfl:    omeprazole (PRILOSEC) 40 MG capsule, Take 40 mg by mouth daily before breakfast. Reported on 08/06/2015, Disp: , Rfl:    Probiotic Product (PROBIOTIC PO), Take 1 capsule by mouth daily., Disp: , Rfl:    rosuvastatin  (CRESTOR ) 20 MG tablet, Take 20 mg by mouth daily., Disp: , Rfl:    vitamin B-12 (CYANOCOBALAMIN ) 1000 MCG tablet, Take 1,000 mcg by  mouth in the morning., Disp: , Rfl:        Objective:     BP (!) 110/58 (BP Location: Left Arm, Patient Position: Sitting, Cuff Size: Normal)   Pulse (!) 59   Ht 5' 8 (1.727 m)   Wt 179 lb 4 oz (81.3 kg)   SpO2 97%   BMI 27.25 kg/m   Wt Readings from Last 3 Encounters:  11/23/23 179 lb 4 oz (81.3 kg)  10/31/23 179 lb 2 oz (81.3 kg)  09/21/23 178 lb (80.7 kg)    Physical Exam  Physical Exam Vitals reviewed.  Constitutional:      Appearance: Normal appearance. Well-developed with normal weight.  Cardiovascular:     Rate and Rhythm: Normal rate and regular rhythm. Normal heart sounds.  Normal peripheral pulses Pulmonary:     Normal breath sounds with normal effort +Skin:    General: Skin is warm and dry without noticeable rash. Neurological:      General: No focal deficit present.  Psychiatric:        Mood and Affect: Mood, behavior and cognition normal      Assessment & Plan:   Encounter Diagnoses  Name Primary?   Stage 3b chronic kidney disease (HCC) Yes   Ischemic cardiomyopathy    Essential hypertension    Gastroesophageal reflux disease, unspecified whether esophagitis present    Coronary artery disease involving native coronary artery of native heart without angina pectoris    Anemia in stage 3b chronic kidney disease (HCC)     No orders of the defined types were placed in this encounter.    Assessment and Plan    Chronic kidney disease Chronic kidney disease with recent decline in kidney function. The decline may be related to the initiation of losartan , which can sometimes affect kidney function. Given history, prefer fu with nephrology - Instruct him to contact nephrologist for follow-up appointment  - Continue empaglaflozin  Coronary artery disease, status post stents Coronary artery disease with stent placement. Currently well-managed with no new symptoms reported. He is under the care of a cardiologist and is participating in a cardiac rehabilitation program. - Continue current cardiac management - Follow up with cardiologist in November and February  - ASA discontinuation in Feb  - Continue rosuvastatin , clopidogrel  for one year duration, metoprolol   - Nitroglycerin  prn  - He will be starting cardiac rehab  Hypertension Hypertension is well-controlled with blood pressure readings between 100 and 120. No symptoms of dizziness or lightheadedness reported. - Monitor blood pressure regularly  Anemia  - Fe deficiency/CKD likely contribution Anemia is improving. Hemoglobin levels have increased from 9.3 to 10.8, indicating positive progress. -  Continue current management with iron supplementation.    GERD - Stable, continue omeprazole   General Health Maintenance Discussed flu vaccination timing. He and his wife usually receive flu shots together. - Advise to receive flu shot in a couple of weeks for optimal coverage       Plan for f/u end of Nov

## 2023-12-06 ENCOUNTER — Emergency Department
Admission: EM | Admit: 2023-12-06 | Discharge: 2023-12-06 | Disposition: A | Discharge status: Home / Self Care | Attending: Emergency Medicine | Admitting: Emergency Medicine

## 2023-12-06 ENCOUNTER — Emergency Department

## 2023-12-06 ENCOUNTER — Other Ambulatory Visit: Payer: Self-pay

## 2023-12-06 DIAGNOSIS — R42 Dizziness and giddiness: Secondary | ICD-10-CM | POA: Insufficient documentation

## 2023-12-06 DIAGNOSIS — R079 Chest pain, unspecified: Secondary | ICD-10-CM

## 2023-12-06 DIAGNOSIS — D72829 Elevated white blood cell count, unspecified: Secondary | ICD-10-CM | POA: Insufficient documentation

## 2023-12-06 DIAGNOSIS — I2510 Atherosclerotic heart disease of native coronary artery without angina pectoris: Secondary | ICD-10-CM | POA: Insufficient documentation

## 2023-12-06 DIAGNOSIS — Z85528 Personal history of other malignant neoplasm of kidney: Secondary | ICD-10-CM | POA: Diagnosis not present

## 2023-12-06 DIAGNOSIS — R072 Precordial pain: Secondary | ICD-10-CM | POA: Diagnosis present

## 2023-12-06 DIAGNOSIS — I129 Hypertensive chronic kidney disease with stage 1 through stage 4 chronic kidney disease, or unspecified chronic kidney disease: Secondary | ICD-10-CM | POA: Insufficient documentation

## 2023-12-06 DIAGNOSIS — Z950 Presence of cardiac pacemaker: Secondary | ICD-10-CM | POA: Insufficient documentation

## 2023-12-06 DIAGNOSIS — N189 Chronic kidney disease, unspecified: Secondary | ICD-10-CM | POA: Diagnosis not present

## 2023-12-06 LAB — BASIC METABOLIC PANEL WITH GFR
Anion gap: 10 (ref 5–15)
BUN: 21 mg/dL (ref 8–23)
CO2: 24 mmol/L (ref 22–32)
Calcium: 9.3 mg/dL (ref 8.9–10.3)
Chloride: 102 mmol/L (ref 98–111)
Creatinine, Ser: 1.24 mg/dL (ref 0.61–1.24)
GFR, Estimated: 59 mL/min — ABNORMAL LOW (ref >60)
Glucose, Bld: 137 mg/dL — ABNORMAL HIGH (ref 70–99)
Potassium: 4 mmol/L (ref 3.5–5.1)
Sodium: 136 mmol/L (ref 135–145)

## 2023-12-06 LAB — CBC
HCT: 34.9 % — ABNORMAL LOW (ref 39.0–52.0)
Hemoglobin: 11.1 g/dL — ABNORMAL LOW (ref 13.0–17.0)
MCH: 29.8 pg (ref 26.0–34.0)
MCHC: 31.8 g/dL (ref 30.0–36.0)
MCV: 93.8 fL (ref 80.0–100.0)
Platelets: 248 K/uL (ref 150–400)
RBC: 3.72 MIL/uL — ABNORMAL LOW (ref 4.22–5.81)
RDW: 14.5 % (ref 11.5–15.5)
WBC: 13.9 K/uL — ABNORMAL HIGH (ref 4.0–10.5)
nRBC: 0 % (ref 0.0–0.2)

## 2023-12-06 LAB — TROPONIN I (HIGH SENSITIVITY)
Troponin I (High Sensitivity): 9 ng/L (ref <18)
Troponin I (High Sensitivity): 8 ng/L (ref <18)

## 2023-12-06 LAB — D-DIMER, QUANTITATIVE: D-Dimer, Quant: 0.96 ug{FEU}/mL — ABNORMAL HIGH (ref 0.00–0.50)

## 2023-12-06 MED ORDER — ISOSORBIDE MONONITRATE ER 30 MG PO TB24: 30.0000 mg | ORAL_TABLET | Freq: Every day | ORAL | 0 refills | Status: DC

## 2023-12-06 MED ORDER — NITROGLYCERIN 0.4 MG SL SUBL
0.4000 mg | SUBLINGUAL_TABLET | SUBLINGUAL | Status: AC | PRN
  Administered 2023-12-06 (×2): 0.4 mg via SUBLINGUAL
  Filled 2023-12-06 (×2): qty 1

## 2023-12-06 MED ORDER — ISOSORBIDE MONONITRATE ER 60 MG PO TB24
30.0000 mg | ORAL_TABLET | Freq: Once | ORAL | Status: AC
  Administered 2023-12-06: 30 mg via ORAL
  Filled 2023-12-06: qty 1

## 2023-12-06 NOTE — ED Triage Notes (Signed)
 Pt arrives via POV. Pt c/o left sided chest pressure that started around 1130 this morning. PT states he has taken 3  SL nitroglycerin  with minimal relief. Pt denies associated symptoms. He is AxOx4.

## 2023-12-06 NOTE — ED Provider Notes (Incomplete)
 [facility] Provider Note    Event Date/Time   First MD Initiated Contact with Patient 12/06/23 1554     (approximate)   History   Chest Pain   HPI  Raymond Tedesco. is a 78 y.o. 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  He states that he took a total of 3 nitroglycerin  tablets that eased off the pain somewhat but still have not resolved.  The pain is currently around a 5 or 6 out of 10.  He denies any shortness of breath, nausea or vomiting, cough, or leg swelling.  I reviewed the past medical records.  The patient was admitted to cardiology at [facility] in July with worsening angina.  She had a PCI with angioplasty at that time.   Physical Exam   Triage Vital Signs: ED Triage Vitals  Encounter Vitals Group     BP 12/06/23 1544 133/65     Girls Systolic BP Percentile --      Girls Diastolic BP Percentile --      Boys Systolic BP Percentile --      Boys Diastolic BP Percentile --      Pulse Rate 12/06/23 1544 78     Resp 12/06/23 1544 17     Temp 12/06/23 1544 98.5 F (36.9 C)     Temp src --      SpO2 12/06/23 1544 96 %     Weight --      Height --      Head Circumference --      Peak Flow --      Pain Score 12/06/23 1540 6     Pain Loc --      Pain Education --      Exclude from Growth Chart --     Most recent vital signs: Vitals:   12/06/23 1830 12/06/23 1900  BP: 133/71 134/67  Pulse: 80 87  Resp: 13 17  Temp:    SpO2: 98% 98%     General: Awake, comfortable appearing, no distress.  CV:  Good peripheral perfusion.   Resp:  Normal effort.  Lungs CTAB. Abd:  No distention.  Other:  No peripheral edema.   ED Results / Procedures / Treatments   Labs (all labs ordered are listed, but only abnormal results are displayed) Labs Reviewed  BASIC METABOLIC PANEL WITH GFR - Abnormal; Notable for the following components:      Result Value   Glucose, Bld 137 (*)    GFR, Estimated 59 (*)    All other components within normal limits  CBC - Abnormal; Notable for the following components:   WBC 13.9 (*)    RBC 3.72 (*)    Hemoglobin 11.1 (*)    HCT 34.9 (*)    All other components within normal limits  D-DIMER, QUANTITATIVE - Abnormal; Notable for the following components:   D-Dimer, Quant 0.96 (*)    All other components within normal limits  TROPONIN I (HIGH SENSITIVITY)  TROPONIN I (HIGH SENSITIVITY)     EKG  ED ECG REPORT I, Waylon Cassis, the attending physician, personally viewed and interpreted this ECG.  Date:  12/06/2023 EKG Time: 1542 Rate: 86 Rhythm: normal sinus rhythm QRS Axis: normal Intervals: RBBB ST/T Wave abnormalities: normal Narrative Interpretation: no evidence of acute ischemia    RADIOLOGY  Chest x-ray: I independently viewed and interpreted the images; there is no focal consolidation or edema  PROCEDURES:  Critical Care performed: No  Procedures   MEDICATIONS ORDERED IN ED: Medications  nitroGLYCERIN  (NITROSTAT ) SL tablet 0.4 mg (0.4 mg Sublingual Given 12/06/23 1739)  isosorbide mononitrate (IMDUR) 24 hr tablet 30 mg (30 mg Oral Given 12/06/23 1951)     IMPRESSION / MDM / ASSESSMENT AND PLAN / ED COURSE  I reviewed the triage vital signs and the nursing notes.  78 year old male with PMH as noted above presents with chest pain since this morning, now somewhat improved although still not resolved.  He has no concerning associated symptoms.  Differential diagnosis includes, but is not limited to, ACS, musculoskeletal pain, GERD, neuropathic pain.  I  have a low suspicion for aortic dissection or other vascular cause, or for PE.  We will obtain chest x-ray, lab work including serial cardiac enzymes, consult cardiology, and reassess.  Patient's presentation is most consistent with acute presentation with potential threat to life or bodily function.  The patient is on the cardiac monitor to evaluate for evidence of arrhythmia and/or significant heart rate changes.   ----------------------------------------- 7:26 PM on 12/06/2023 -----------------------------------------  Initial and repeat troponins are both negative.  BMP and CBC showed no acute findings except for mild leukocytosis.  Given the patient's elevated CAD risk and the concern for possible unstable angina, I consulted Dr. Florencio from cardiology.  He agreed that the patient was at elevated risk and likely would need a cath relatively soon.  He also agreed that since the patient's enzymes were negative, he would be potentially appropriate for discharge home.  He recommended that I offer the patient admission with possible cath tomorrow, versus close outpatient follow-up in the office tomorrow with a plan to schedule outpatient catheter in the next few days.  Dr. Florencio also recommended that if he is discharged, I start the patient on Imdur.  I did consider whether the patient may benefit from inpatient admission.  I discussed this with the patient.  He states he is feeling much better after the nitroglycerin  given here and has a strong preference to go home with close outpatient follow-up.  Given the negative enzymes I think that this is reasonable.  I informed Dr. Florencio of the patient's preference.  I gave strict return precautions, and the patient expressed understanding.   FINAL CLINICAL IMPRESSION(S) / ED DIAGNOSES   Final diagnoses:  Nonspecific chest pain     Rx / DC Orders   ED Discharge Orders          Ordered    isosorbide mononitrate (IMDUR) 30 MG 24 hr tablet   Daily        12/06/23 1925             Note:  This document was prepared using Dragon voice recognition software and may include unintentional dictation errors.    Jacolyn Pae, MD 12/07/23 9948    Jacolyn Pae, MD 12/07/23 [phone]

## 2023-12-06 NOTE — Discharge Instructions (Signed)
 Call Dr. Bernita office tomorrow to arrange for follow-up visit.  Return to the ER immediately for new, worsening, or persistent severe chest pain, difficulty breathing, weakness or lightheadedness, or any other new or worsening symptoms that concern you.SABRA

## 2023-12-08 ENCOUNTER — Other Ambulatory Visit: Payer: Self-pay | Admitting: Internal Medicine

## 2023-12-08 ENCOUNTER — Ambulatory Visit
Admission: RE | Admit: 2023-12-08 | Discharge: 2023-12-08 | Disposition: A | Discharge status: Home / Self Care | Source: Ambulatory Visit | Attending: Internal Medicine | Admitting: Internal Medicine

## 2023-12-08 DIAGNOSIS — R7989 Other specified abnormal findings of blood chemistry: Secondary | ICD-10-CM | POA: Diagnosis present

## 2023-12-08 DIAGNOSIS — I2699 Other pulmonary embolism without acute cor pulmonale: Secondary | ICD-10-CM | POA: Diagnosis present

## 2023-12-08 MED ORDER — IOHEXOL 350 MG/ML SOLN
75.0000 mL | Freq: Once | INTRAVENOUS | Status: AC | PRN
  Administered 2023-12-08: 75 mL via INTRAVENOUS

## 2023-12-27 ENCOUNTER — Other Ambulatory Visit: Payer: Self-pay

## 2023-12-27 ENCOUNTER — Emergency Department

## 2023-12-27 ENCOUNTER — Inpatient Hospital Stay
Admission: EM | Admit: 2023-12-27 | Discharge: 2024-01-03 | DRG: 312 | Disposition: A | Discharge status: Home Health | Attending: Internal Medicine | Admitting: Internal Medicine

## 2023-12-27 DIAGNOSIS — R4020 Unspecified coma: Principal | ICD-10-CM

## 2023-12-27 DIAGNOSIS — S2243XA Multiple fractures of ribs, bilateral, initial encounter for closed fracture: Secondary | ICD-10-CM

## 2023-12-27 DIAGNOSIS — I4510 Unspecified right bundle-branch block: Secondary | ICD-10-CM | POA: Diagnosis present

## 2023-12-27 DIAGNOSIS — I700 Atherosclerosis of aorta: Secondary | ICD-10-CM | POA: Diagnosis present

## 2023-12-27 DIAGNOSIS — I429 Cardiomyopathy, unspecified: Secondary | ICD-10-CM | POA: Diagnosis not present

## 2023-12-27 DIAGNOSIS — Z950 Presence of cardiac pacemaker: Secondary | ICD-10-CM | POA: Diagnosis present

## 2023-12-27 DIAGNOSIS — Z860100 Personal history of colon polyps, unspecified: Secondary | ICD-10-CM

## 2023-12-27 DIAGNOSIS — I452 Bifascicular block: Secondary | ICD-10-CM | POA: Diagnosis present

## 2023-12-27 DIAGNOSIS — I951 Orthostatic hypotension: Secondary | ICD-10-CM | POA: Diagnosis not present

## 2023-12-27 DIAGNOSIS — I255 Ischemic cardiomyopathy: Secondary | ICD-10-CM | POA: Diagnosis present

## 2023-12-27 DIAGNOSIS — R4189 Other symptoms and signs involving cognitive functions and awareness: Secondary | ICD-10-CM

## 2023-12-27 DIAGNOSIS — Z6827 Body mass index (BMI) 27.0-27.9, adult: Secondary | ICD-10-CM

## 2023-12-27 DIAGNOSIS — Z79899 Other long term (current) drug therapy: Secondary | ICD-10-CM

## 2023-12-27 DIAGNOSIS — Z9481 Bone marrow transplant status: Secondary | ICD-10-CM

## 2023-12-27 DIAGNOSIS — Z8572 Personal history of non-Hodgkin lymphomas: Secondary | ICD-10-CM

## 2023-12-27 DIAGNOSIS — K5900 Constipation, unspecified: Secondary | ICD-10-CM | POA: Diagnosis present

## 2023-12-27 DIAGNOSIS — I5032 Chronic diastolic (congestive) heart failure: Secondary | ICD-10-CM | POA: Diagnosis present

## 2023-12-27 DIAGNOSIS — M96A3 Multiple fractures of ribs associated with chest compression and cardiopulmonary resuscitation: Secondary | ICD-10-CM | POA: Diagnosis present

## 2023-12-27 DIAGNOSIS — Z85528 Personal history of other malignant neoplasm of kidney: Secondary | ICD-10-CM

## 2023-12-27 DIAGNOSIS — Z8249 Family history of ischemic heart disease and other diseases of the circulatory system: Secondary | ICD-10-CM

## 2023-12-27 DIAGNOSIS — S2249XA Multiple fractures of ribs, unspecified side, initial encounter for closed fracture: Secondary | ICD-10-CM | POA: Diagnosis not present

## 2023-12-27 DIAGNOSIS — Z45018 Encounter for adjustment and management of other part of cardiac pacemaker: Secondary | ICD-10-CM

## 2023-12-27 DIAGNOSIS — Z7984 Long term (current) use of oral hypoglycemic drugs: Secondary | ICD-10-CM

## 2023-12-27 DIAGNOSIS — S2239XA Fracture of one rib, unspecified side, initial encounter for closed fracture: Secondary | ICD-10-CM

## 2023-12-27 DIAGNOSIS — E876 Hypokalemia: Secondary | ICD-10-CM | POA: Insufficient documentation

## 2023-12-27 DIAGNOSIS — I10 Essential (primary) hypertension: Secondary | ICD-10-CM | POA: Diagnosis not present

## 2023-12-27 DIAGNOSIS — Z905 Acquired absence of kidney: Secondary | ICD-10-CM

## 2023-12-27 DIAGNOSIS — I2510 Atherosclerotic heart disease of native coronary artery without angina pectoris: Secondary | ICD-10-CM | POA: Diagnosis present

## 2023-12-27 DIAGNOSIS — Z803 Family history of malignant neoplasm of breast: Secondary | ICD-10-CM

## 2023-12-27 DIAGNOSIS — Z9049 Acquired absence of other specified parts of digestive tract: Secondary | ICD-10-CM

## 2023-12-27 DIAGNOSIS — I130 Hypertensive heart and chronic kidney disease with heart failure and stage 1 through stage 4 chronic kidney disease, or unspecified chronic kidney disease: Secondary | ICD-10-CM | POA: Diagnosis present

## 2023-12-27 DIAGNOSIS — N179 Acute kidney failure, unspecified: Secondary | ICD-10-CM | POA: Diagnosis present

## 2023-12-27 DIAGNOSIS — E663 Overweight: Secondary | ICD-10-CM | POA: Diagnosis present

## 2023-12-27 DIAGNOSIS — Z955 Presence of coronary angioplasty implant and graft: Secondary | ICD-10-CM

## 2023-12-27 DIAGNOSIS — Z7902 Long term (current) use of antithrombotics/antiplatelets: Secondary | ICD-10-CM

## 2023-12-27 DIAGNOSIS — M25511 Pain in right shoulder: Secondary | ICD-10-CM | POA: Insufficient documentation

## 2023-12-27 DIAGNOSIS — Z85828 Personal history of other malignant neoplasm of skin: Secondary | ICD-10-CM

## 2023-12-27 DIAGNOSIS — E785 Hyperlipidemia, unspecified: Secondary | ICD-10-CM | POA: Diagnosis present

## 2023-12-27 DIAGNOSIS — I495 Sick sinus syndrome: Secondary | ICD-10-CM | POA: Diagnosis present

## 2023-12-27 DIAGNOSIS — R110 Nausea: Secondary | ICD-10-CM | POA: Diagnosis present

## 2023-12-27 DIAGNOSIS — Z7982 Long term (current) use of aspirin: Secondary | ICD-10-CM

## 2023-12-27 DIAGNOSIS — D509 Iron deficiency anemia, unspecified: Secondary | ICD-10-CM | POA: Diagnosis present

## 2023-12-27 DIAGNOSIS — N1831 Chronic kidney disease, stage 3a: Secondary | ICD-10-CM | POA: Diagnosis present

## 2023-12-27 HISTORY — DX: Other symptoms and signs involving cognitive functions and awareness: R41.89

## 2023-12-27 HISTORY — DX: Fracture of one rib, unspecified side, initial encounter for closed fracture: S22.39XA

## 2023-12-27 LAB — APTT: aPTT: 30 s (ref 24–36)

## 2023-12-27 LAB — CBC
HCT: 31.6 % — ABNORMAL LOW (ref 39.0–52.0)
Hemoglobin: 9.9 g/dL — ABNORMAL LOW (ref 13.0–17.0)
MCH: 28.7 pg (ref 26.0–34.0)
MCHC: 31.3 g/dL (ref 30.0–36.0)
MCV: 91.6 fL (ref 80.0–100.0)
Platelets: 257 K/uL (ref 150–400)
RBC: 3.45 MIL/uL — ABNORMAL LOW (ref 4.22–5.81)
RDW: 14.6 % (ref 11.5–15.5)
WBC: 9.7 K/uL (ref 4.0–10.5)
nRBC: 0 % (ref 0.0–0.2)

## 2023-12-27 LAB — BASIC METABOLIC PANEL WITH GFR
Anion gap: 10 (ref 5–15)
BUN: 25 mg/dL — ABNORMAL HIGH (ref 8–23)
CO2: 24 mmol/L (ref 22–32)
Calcium: 8.9 mg/dL (ref 8.9–10.3)
Chloride: 104 mmol/L (ref 98–111)
Creatinine, Ser: 1.48 mg/dL — ABNORMAL HIGH (ref 0.61–1.24)
GFR, Estimated: 48 mL/min — ABNORMAL LOW (ref >60)
Glucose, Bld: 116 mg/dL — ABNORMAL HIGH (ref 70–99)
Potassium: 4 mmol/L (ref 3.5–5.1)
Sodium: 138 mmol/L (ref 135–145)

## 2023-12-27 LAB — TROPONIN I (HIGH SENSITIVITY)
Troponin I (High Sensitivity): 9 ng/L (ref <18)
Troponin I (High Sensitivity): 8 ng/L (ref <18)

## 2023-12-27 LAB — PROTIME-INR
INR: 1.1 (ref 0.8–1.2)
Prothrombin Time: 14.8 s (ref 11.4–15.2)

## 2023-12-27 LAB — BRAIN NATRIURETIC PEPTIDE: B Natriuretic Peptide: 58.4 pg/mL (ref 0.0–100.0)

## 2023-12-27 MED ORDER — HYDRALAZINE HCL 20 MG/ML IJ SOLN: 5.0000 mg | INTRAMUSCULAR | Status: DC | PRN

## 2023-12-27 MED ORDER — OXYCODONE-ACETAMINOPHEN 5-325 MG PO TABS
1.0000 | ORAL_TABLET | ORAL | Status: DC | PRN
Start: 2023-12-27 — End: 2024-01-03
  Administered 2023-12-28 – 2024-01-02 (×9): 1 via ORAL
  Filled 2023-12-27 (×10): qty 1

## 2023-12-27 MED ORDER — MORPHINE SULFATE (PF) 2 MG/ML IV SOLN
2.0000 mg | Freq: Once | INTRAVENOUS | Status: AC
  Administered 2023-12-27: 2 mg via INTRAVENOUS
  Filled 2023-12-27: qty 1

## 2023-12-27 MED ORDER — SODIUM CHLORIDE 0.9 % IV SOLN: INTRAVENOUS | Status: DC

## 2023-12-27 MED ORDER — SODIUM CHLORIDE 0.9 % IV BOLUS
250.0000 mL | Freq: Once | INTRAVENOUS | Status: AC
  Administered 2023-12-27: 250 mL via INTRAVENOUS

## 2023-12-27 MED ORDER — VITAMIN B-12 1000 MCG PO TABS
1000.0000 ug | ORAL_TABLET | Freq: Every morning | ORAL | Status: DC
  Administered 2023-12-28 – 2024-01-03 (×8): 1000 ug via ORAL
  Filled 2023-12-27 (×5): qty 1
  Filled 2023-12-27: qty 2
  Filled 2023-12-27 (×2): qty 1

## 2023-12-27 MED ORDER — NITROGLYCERIN 0.4 MG SL SUBL: 0.4000 mg | SUBLINGUAL_TABLET | SUBLINGUAL | Status: DC | PRN

## 2023-12-27 MED ORDER — CLOPIDOGREL BISULFATE 75 MG PO TABS
75.0000 mg | ORAL_TABLET | Freq: Every day | ORAL | Status: DC
  Administered 2023-12-28 – 2024-01-03 (×7): 75 mg via ORAL
  Filled 2023-12-27 (×7): qty 1

## 2023-12-27 MED ORDER — FERROUS SULFATE 325 (65 FE) MG PO TABS
325.0000 mg | ORAL_TABLET | Freq: Every morning | ORAL | Status: DC
  Administered 2023-12-28 – 2024-01-03 (×8): 325 mg via ORAL
  Filled 2023-12-27 (×8): qty 1

## 2023-12-27 MED ORDER — FINASTERIDE 5 MG PO TABS
5.0000 mg | ORAL_TABLET | Freq: Every day | ORAL | Status: DC
  Administered 2023-12-30 – 2024-01-03 (×5): 5 mg via ORAL
  Filled 2023-12-27 (×7): qty 1

## 2023-12-27 MED ORDER — PROSIGHT PO TABS
1.0000 | ORAL_TABLET | Freq: Two times a day (BID) | ORAL | Status: DC
  Administered 2023-12-28 – 2024-01-03 (×11): 1 via ORAL
  Filled 2023-12-27 (×14): qty 1

## 2023-12-27 MED ORDER — ASPIRIN 81 MG PO TBEC
81.0000 mg | DELAYED_RELEASE_TABLET | Freq: Every day | ORAL | Status: DC
  Administered 2023-12-28 – 2024-01-03 (×7): 81 mg via ORAL
  Filled 2023-12-27 (×8): qty 1

## 2023-12-27 MED ORDER — ACETAMINOPHEN 325 MG PO TABS
650.0000 mg | ORAL_TABLET | Freq: Four times a day (QID) | ORAL | Status: DC | PRN
  Administered 2023-12-27 – 2024-01-03 (×4): 650 mg via ORAL
  Filled 2023-12-27 (×4): qty 2

## 2023-12-27 MED ORDER — IOHEXOL 350 MG/ML SOLN
75.0000 mL | Freq: Once | INTRAVENOUS | Status: AC | PRN
  Administered 2023-12-27: 75 mL via INTRAVENOUS

## 2023-12-27 MED ORDER — FINASTERIDE 1 MG PO TABS: 1.0000 mg | ORAL_TABLET | Freq: Every morning | ORAL | Status: DC

## 2023-12-27 MED ORDER — MORPHINE SULFATE (PF) 2 MG/ML IV SOLN
2.0000 mg | INTRAVENOUS | Status: DC | PRN
  Administered 2023-12-30 – 2023-12-31 (×2): 2 mg via INTRAVENOUS
  Filled 2023-12-27 (×3): qty 1

## 2023-12-27 MED ORDER — EMPAGLIFLOZIN 10 MG PO TABS
10.0000 mg | ORAL_TABLET | Freq: Every day | ORAL | Status: DC
  Administered 2023-12-29 – 2023-12-31 (×3): 10 mg via ORAL
  Filled 2023-12-27 (×4): qty 1

## 2023-12-27 MED ORDER — METOPROLOL SUCCINATE ER 50 MG PO TB24
25.0000 mg | ORAL_TABLET | Freq: Every day | ORAL | Status: DC
Start: 2023-12-28 — End: 2023-12-28
  Administered 2023-12-28: 25 mg via ORAL
  Filled 2023-12-27: qty 1

## 2023-12-27 MED ORDER — LIDOCAINE 5 % EX PTCH
1.0000 | MEDICATED_PATCH | CUTANEOUS | Status: DC
  Administered 2023-12-27 – 2024-01-02 (×7): 1 via TRANSDERMAL
  Filled 2023-12-27 (×8): qty 1

## 2023-12-27 MED ORDER — ONDANSETRON HCL 4 MG/2ML IJ SOLN: 4.0000 mg | Freq: Three times a day (TID) | INTRAMUSCULAR | Status: DC | PRN

## 2023-12-27 MED ORDER — MAGNESIUM OXIDE -MG SUPPLEMENT 400 (240 MG) MG PO TABS
400.0000 mg | ORAL_TABLET | Freq: Every day | ORAL | Status: DC
  Administered 2023-12-28 – 2024-01-03 (×7): 400 mg via ORAL
  Filled 2023-12-27 (×7): qty 1

## 2023-12-27 MED ORDER — MORPHINE SULFATE (PF) 4 MG/ML IV SOLN
4.0000 mg | Freq: Once | INTRAVENOUS | Status: AC
  Administered 2023-12-27: 4 mg via INTRAVENOUS
  Filled 2023-12-27: qty 1

## 2023-12-27 MED ORDER — ACETAMINOPHEN 500 MG PO TABS
1000.0000 mg | ORAL_TABLET | Freq: Once | ORAL | Status: AC
  Administered 2023-12-27: 1000 mg via ORAL
  Filled 2023-12-27: qty 2

## 2023-12-27 MED ORDER — PANTOPRAZOLE SODIUM 40 MG PO TBEC
40.0000 mg | DELAYED_RELEASE_TABLET | Freq: Every day | ORAL | Status: DC
  Administered 2023-12-28 – 2024-01-03 (×7): 40 mg via ORAL
  Filled 2023-12-27 (×7): qty 1

## 2023-12-27 NOTE — H&P (Signed)
 History and Physical    Jolee LULLA Emilio Mickey. FMW:[id number] DOB: February 18, 1946 DOA: 12/27/2023  Referring MD/NP/PA:   PCP: Zafirov, Clarissa A, MD   Patient coming from:  The patient is coming from home.     Chief Complaint: Unresponsiveness  HPI: Benecio Kluger. is a 78 y.o. 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  Per pt and his wife at bedside, pt was speaking with a lawn care provider in his yard around 2:00 PM, while talking with him started feeling suddenly unwell. Pt was assisted to the ground by a group of landscapers where pt then became unresponsive. It lasted for about 1-2 minutes.  No seizure activity. They did not feel a pulse so initiated CPR.  Patient spontaneously returned to consciousness during this.  Patient states that after the event, when he tried to stand up, he fell dizzy and lightheaded.  No unilateral numbness or tingling in extremities.  No facial droop or slurred speech.  He states that he did not have chest pain before the event, but complains of frontal chest wall pain after CPR. Patient does not have nausea, vomiting, diarrhea or abdominal pain.  No symptoms of UTI.  No feeling of pacemaker flareup.  No fever or chills.   Data reviewed independently and ED Course: pt was found to have trop 9 --> 8, WBC 9.1, stable renal function, BNP 58.4, INR 1.1.  Temperature normal, blood pressure 104/59, 137/71, heart rate 65-93, RR 15, oxygen saturation 100% on room air.  Patient is placed in the PCU for observation.  CT of head: 1. No acute intracranial abnormality. 2. Chronic left precentral gyrus infarcts.  CTA: 1. Negative for acute pulmonary embolus. 2.  Cardiomegaly. 3. Multiple bilateral anterior rib fractures. No pneumothorax.  Acute right second, third, fourth, fifth, 6, seventh anterior rib fractures. Acute left second, fourth, fifth, sixth anterior rib fractures. 4. Aortic atherosclerosis.  Chest x-ray: 1. Mild cardiomegaly. 2. LEFT basilar opacity most likely due to atelectasis and possible minimal pleural effusion.   EKG: I have personally reviewed.  Sinus rhythm, QTc 476, bifascicular block, poor R wave progression.   Review of Systems:   General: no fevers, chills, no body weight gain, has fatigue HEENT: no blurry vision, hearing changes or sore throat Respiratory: no dyspnea, coughing, wheezing CV: no chest pain, no palpitations GI: no nausea, vomiting, abdominal pain, diarrhea, constipation GU: no dysuria, burning on urination, increased urinary frequency, hematuria  Ext: no leg edema Neuro: no unilateral weakness, numbness, or tingling, no vision change or hearing loss.  Has unresponsiveness. Skin: no rash, no skin tear. MSK: No muscle spasm, no deformity, no limitation of range of movement in spin. Has frontal chest wall tenderness. Heme: No easy bruising.  Travel history: No recent long distant travel.   Allergy:  Allergies  Allergen Reactions   Bovine (Beef) Protein-Containing Drug Products Nausea And Vomiting    Past Medical History:  Diagnosis Date   Anemia    Basal cell carcinoma 01/17/2014   L lateral top of shoulder ant, L ant top of shoulder post   Basal cell carcinoma 01/01/2016   L mid back   Basal cell carcinoma 11/17/2017   R mid ear helix   Basal cell  carcinoma 02/06/2019   L scalp/temple area within the hair above the sideburn/excision   Basal cell carcinoma 08/15/2019   Right chest medial infraclavicular. Superficial and nodular.    Basal cell carcinoma 08/15/2019   Left chest parasternal. Superficial.   Basal cell carcinoma 02/14/2020   L thigh above knee    Basal cell carcinoma  10/20/2022   R lat neck, EDC   Cancer (HCC)    Chronic kidney disease    renal cell carcinoma   Coronary artery disease    GERD (gastroesophageal reflux disease)    History of colon polyps    History of gastric polyp    History of hiatal hernia    Neuropathy    Non Hodgkin's lymphoma (HCC)    PONV (postoperative nausea and vomiting)    difficulty waking up after surgery   Reflux    Renal mass    SOBOE (shortness of breath on exertion) 03/09/2018    Past Surgical History:  Procedure Laterality Date   BONE MARROW TRANSPLANT     stem cell transplant at [facility] years ago   CHOLECYSTECTOMY     COLONOSCOPY WITH PROPOFOL  N/A 02/11/2017   Procedure: COLONOSCOPY WITH PROPOFOL ;  Surgeon: Viktoria Lamar DASEN, MD;  Location: [facility] ENDOSCOPY;  Service: Endoscopy;  Laterality: N/A;   CORONARY STENT INTERVENTION N/A 06/22/2023   Procedure: CORONARY STENT INTERVENTION;  Surgeon: Katina Albright, MD;  Location: [facility] INVASIVE CV LAB;  Service: Cardiovascular;  Laterality: N/A;   CORONARY ULTRASOUND/IVUS N/A 06/22/2023   Procedure: Coronary Ultrasound/IVUS;  Surgeon: Katina Albright, MD;  Location: [facility] INVASIVE CV LAB;  Service: Cardiovascular;  Laterality: N/A;   ESOPHAGOGASTRODUODENOSCOPY (EGD) WITH PROPOFOL  N/A 02/11/2017   Procedure: ESOPHAGOGASTRODUODENOSCOPY (EGD) WITH PROPOFOL ;  Surgeon: Viktoria Lamar DASEN, MD;  Location: [facility] ENDOSCOPY;  Service: Endoscopy;  Laterality: N/A;   FRACTURE SURGERY     GALLBLADDER SURGERY     HERNIA REPAIR Left    Inguinal Hernia Repair   LEFT HEART CATH AND CORONARY ANGIOGRAPHY Left 06/22/2023   Procedure: LEFT HEART CATH AND CORONARY ANGIOGRAPHY;  Surgeon: Katina Albright, MD;  Location: [facility] INVASIVE CV LAB;  Service: Cardiovascular;  Laterality: Left;   LEFT HEART CATH AND CORONARY ANGIOGRAPHY N/A 09/07/2023   Procedure: LEFT HEART CATH AND CORONARY ANGIOGRAPHY;  Surgeon: Katina Albright, MD;  Location: [facility] INVASIVE CV LAB;  Service: Cardiovascular;  Laterality: N/A;    PACEMAKER LEADLESS INSERTION N/A 11/02/2022   Procedure: PACEMAKER LEADLESS INSERTION;  Surgeon: Ammon Blunt, MD;  Location: [facility] INVASIVE CV LAB;  Service: Cardiovascular;  Laterality: N/A;   ROBOTIC ASSITED PARTIAL NEPHRECTOMY Left 11/12/2015   Procedure: ROBOTIC ASSITED PARTIAL NEPHRECTOMY;  Surgeon: Redell Lynwood Napoleon, MD;  Location: [facility] ORS;  Service: Urology;  Laterality: Left;   TONSILLECTOMY      Social History:  reports that he has never smoked. He has never used smokeless tobacco. He reports that he does not drink alcohol and does not use drugs.  Family History:  Family History  Problem Relation Age of Onset   Breast cancer Mother    Heart attack Father      Prior to Admission medications   Medication Sig Start Date End Date Taking? Authorizing Provider  aspirin  EC 81 MG tablet Take 1 tablet (81 mg total) by mouth daily. Swallow whole. 09/08/23   Maree Hue, MD  Cholecalciferol  25 MCG (1000 UT) tablet Take 1,000 Units by mouth daily.    [provider]  clopidogrel  (PLAVIX ) 75 MG tablet Take 1 tablet (75 mg  total) by mouth daily. 06/22/23   Hudson, Caralyn, PA-C  empagliflozin  (JARDIANCE ) 10 MG TABS tablet Take 1 tablet by mouth daily. 01/04/23 01/04/24  [provider]  ferrous sulfate  325 (65 FE) MG tablet Take 325 mg by mouth in the morning.    [provider]  finasteride  (PROPECIA ) 1 MG tablet Take 1 tablet by mouth every morning. 03/15/18   [provider]  isosorbide mononitrate (IMDUR) 30 MG 24 hr tablet Take 1 tablet (30 mg total) by mouth daily. 12/06/23 01/05/24  Jacolyn Pae, MD  magnesium  oxide (MAG-OX) 400 MG tablet Take 400 mg by mouth daily.    [provider]  metoprolol  succinate (TOPROL -XL) 25 MG 24 hr tablet Take 25 mg by mouth daily.    [provider]  Multiple Vitamins-Minerals (PRESERVISION AREDS 2) CAPS Take 1 capsule by mouth 2 (two) times daily.    [provider]  nitroGLYCERIN   (NITROSTAT ) 0.4 MG SL tablet Place 0.4 mg under the tongue every 5 (five) minutes x 3 doses as needed for chest pain. 06/14/23 06/13/24  [provider]  omeprazole (PRILOSEC) 40 MG capsule Take 40 mg by mouth daily before breakfast. Reported on 08/06/2015 10/29/13   [provider]  Probiotic Product (PROBIOTIC PO) Take 1 capsule by mouth daily.    [provider]  rosuvastatin  (CRESTOR ) 20 MG tablet Take 20 mg by mouth daily. 06/29/23 06/28/24  [provider]  vitamin B-12 (CYANOCOBALAMIN ) 1000 MCG tablet Take 1,000 mcg by mouth in the morning.    [provider]    Physical Exam: Vitals:   12/27/23 1900 12/27/23 2000 12/27/23 2025 12/28/23 0000  BP: 118/64 135/79  (!) 123/57  Pulse: 99 92  70  Resp: 14 19  12   Temp:   98.4 F (36.9 C)   TempSrc:   Oral   SpO2: 95% 100%  98%  Weight:      Height:       General: Not in acute distress HEENT:       Eyes: PERRL, EOMI, no jaundice       ENT: No discharge from the ears and nose, no pharynx injection, no tonsillar enlargement.        Neck: No JVD, no bruit, no mass felt. Heme: No neck lymph node enlargement. Cardiac: S1/S2, RRR, No murmurs, No gallops or rubs. Respiratory: No rales, wheezing, rhonchi or rubs. GI: Soft, nondistended, nontender, no rebound pain, no organomegaly, BS present. GU: No hematuria Ext: No pitting leg edema bilaterally. 1+DP/PT pulse bilaterally. Musculoskeletal: Has front chest wall tenderness Skin: No rashes.  Neuro: Alert, oriented X3, cranial nerves II-XII grossly intact, moves all extremities normally.  Psych: Patient is not psychotic, no suicidal or hemocidal ideation.  Labs on Admission: I have personally reviewed following labs and imaging studies  CBC: Recent Labs  Lab 12/27/23 1448  WBC 9.7  HGB 9.9*  HCT 31.6*  MCV 91.6  PLT 257   Basic Metabolic Panel: Recent Labs  Lab 12/27/23 1448  NA 138  K 4.0  CL 104  CO2 24  GLUCOSE 116*  BUN 25*   CREATININE 1.48*  CALCIUM  8.9   GFR: Estimated Creatinine Clearance: 38.5 mL/min (A) (by C-G formula based on SCr of 1.48 mg/dL (H)). Liver Function Tests: No results for input(s): AST, ALT, ALKPHOS, BILITOT, PROT, ALBUMIN in the last 168 hours. No results for input(s): LIPASE, AMYLASE in the last 168 hours. No results for input(s): AMMONIA in the last 168 hours. Coagulation Profile:  Recent Labs  Lab 12/27/23 1448  INR 1.1   Cardiac Enzymes: No results for input(s): CKTOTAL, CKMB, CKMBINDEX, TROPONINI in the last 168 hours. BNP (last 3 results) No results for input(s): PROBNP in the last 8760 hours. HbA1C: No results for input(s): HGBA1C in the last 72 hours. CBG: No results for input(s): GLUCAP in the last 168 hours. Lipid Profile: No results for input(s): CHOL, HDL, LDLCALC, TRIG, CHOLHDL, LDLDIRECT in the last 72 hours. Thyroid Function Tests: No results for input(s): TSH, T4TOTAL, FREET4, T3FREE, THYROIDAB in the last 72 hours. Anemia Panel: No results for input(s): VITAMINB12, FOLATE, FERRITIN, TIBC, IRON, RETICCTPCT in the last 72 hours. Urine analysis: No results found for: COLORURINE, APPEARANCEUR, LABSPEC, PHURINE, GLUCOSEU, HGBUR, BILIRUBINUR, KETONESUR, PROTEINUR, UROBILINOGEN, NITRITE, LEUKOCYTESUR Sepsis Labs: @LABRCNTIP (procalcitonin:4,lacticidven:4) )No results found for this or any previous visit (from the past 240 hours).   Radiological Exams on Admission:   Assessment/Plan Principal Problem:   Unresponsiveness Active Problems:   Closed rib fracture   Cardiomyopathy, idiopathic (HCC)   Coronary artery disease   Essential hypertension   Hyperlipidemia   Chronic kidney disease, stage 3a (HCC)   Iron deficiency anemia   Overweight (BMI 25.0-29.9)   Assessment and Plan:  Unresponsiveness: Patient had syncope versus cardiac arrest.  Etiology is not clear.   Patient reports lightheadedness with standing up, indicating possible orthostatic status.  No seizure activity per report.  Patient has multiple cardiac issues, likely due to cardiac event.  No focal neurodeficit on physical examination.  CT negative for PE.  Per ED physician, pacemaker interrogation did not show significant events.  Consulted Dr. Wilburn of cardiology.  - Place in tele bed for for obs - Orthostatic vital signs  - Frequent neuro checks  - IVF: NS 250 cc and then 50 cc/h - PT/OT eval and treat  Closed rib fracture: - Pain control: As needed morphine , Percocet, Tylenol  - Lidoderm  patch - Incentive spirometry  Cardiomyopathy, idiopathic (HCC): 2D echo on 09/06/2023 showed EF 35-40% with grade 1 diastolic dysfunction.  Patient does not have leg edema or JVD.  CHF seem to be compensated.  BNP 58.4 - Watch volume status closely  Coronary artery disease: S/p of stent placement.  Troponin 9- > 8. - Continue aspirin , Plavix  - Crestor  - Temporarily hold Imdur  Essential hypertension -IV hydralazine  as needed - Metoprolol   Hyperlipidemia -Crestor   Chronic kidney disease, stage 3a (HCC): Stable renal function.  Baseline creatinine 1.2-1.5 recently.  His creatinine is 1.48, BUN 25, GFR 48. -Follow-up with BMP  Iron deficiency anemia: Hemoglobin 9.9 (11.1 on 12/06/2023) -Continue iron supplement  Overweight (BMI 25.0-29.9): Body weight 81.2 kg, BMI 27.22 - Encourage losing weight - Exercise healthy diet       DVT ppx: SCD  Code Status: Full code   Family Communication:  Yes, patient's wife at bed side.   Disposition Plan:  Anticipate discharge back to previous environment  Consults called: Dr. Wilburn of cardiology  Admission status and Level of care: Progressive:    for obs    Dispo: The patient is from: Home              Anticipated d/c is to: Home              Anticipated d/c date is: 1 day              Patient currently is not medically stable to  d/c.    Severity of Illness:  The appropriate patient status for this patient  is OBSERVATION. Observation status is judged to be reasonable and necessary in order to provide the required intensity of service to ensure the patient's safety. The patient's presenting symptoms, physical exam findings, and initial radiographic and laboratory data in the context of their medical condition is felt to place them at decreased risk for further clinical deterioration. Furthermore, it is anticipated that the patient will be medically stable for discharge from the hospital within 2 midnights of admission.        Date of Service 12/28/2023    Caleb Exon [facility]   If 7PM-7AM, please contact night-coverage [email or web address] 12/28/2023, 1:46 AM

## 2023-12-27 NOTE — ED Triage Notes (Signed)
 Pt arrives via EMS for syncope vs cardiac arrest. Pt was outside and started to feel weak like he was going to pass out. Pt was assisted to the ground by a group of landscapers where pt then became unresponsive. They were not able to find a pulse and they started CPR. PT only complaints at this time is chest pain from the CPR. Pt sts that this happened back in July when he got a stent placed at [facility].

## 2023-12-27 NOTE — ED Provider Notes (Signed)
 [facility] Provider Note    Event Date/Time   First MD Initiated Contact with Patient 12/27/23 1500     (approximate)   History   Loss of Consciousness  Pt arrives via EMS for syncope vs cardiac arrest. Pt was outside and started to feel weak like he was going to pass out. Pt was assisted to the ground by a group of landscapers where pt then became unresponsive. They were not able to find a pulse and they started CPR. PT only complaints at this time is chest pain from the CPR. Pt sts that this happened back in July when he got a stent placed at [facility].   HPI Raymond Bennett. is a 78 y.o. male PMH CAD, CKD, non-Hodgkin's lymphoma, cardiomyopathy, sick sinus syndrome and AV block status post pacemaker placement presents for evaluation after a loss of consciousness episode vs cardiac arrest - Patient was speaking with a lawn care provider in his yard, while talking with him started feeling suddenly very lightheaded.  They had him sit down and then patient currently lost consciousness for about 1-1/2-2 minutes.  They did not feel a pulse so initiated CPR.  Patient spontaneously returned to consciousness during this.  No significant right sided chest pain after CPR.  Denies any preceding symptoms other than lightheadedness--no preceding chest pain, leg swelling, recent infectious symptoms, shortness of breath. - Has otherwise been in his usual state of health.  He is taking his medications as prescribed. - Accompanied by his wife who provides collateral - full code     Per chart review, last seen by cardiology on 12/08/2023.  Summary of recent cardiac evaluations below.  Did receive CT PE to evaluate for underlying pulmonary embolism in setting of chest discomfort, subsequently negative.  Last admission in July of this year after originally presented to cardiology clinic complaining of worsening anginal symptoms for 3 weeks.  Left hospital AMA on 7/1.  Underwent left  heart cath on 7/2, noted to have severe one-vessel CAD involving ostial LAD and prior LAD/D1 stent widely patent.  Readmitted under hospitalist service, started on heparin  infusion, transferred from to [facility] for high risk PCI.  Had a syncopal episode in hospital, was apparently not found to have a palpable pulse, compressions started, had rapid return of pulse within a few seconds.  On 7/3 underwent PCI of LAD into LM with rescue angioplasty of LCx.  Continued on aspirin  and Plavix  indefinitely at discharge.  Echocardiogram 2D complete: 09/06/2023 IMPRESSIONS   1. Left ventricular ejection fraction, by estimation, is 35 to 40%. The left ventricle has moderately decreased function. The left ventricle demonstrates regional wall motion abnormalities (see scoring diagram/findings for description). Left ventricular   diastolic parameters are consistent with Grade I diastolic dysfunction (impaired relaxation).   2. Right ventricular systolic function is normal. The right ventricular size is normal.   3. The mitral valve is normal in structure. No evidence of mitral valve regurgitation.   4. The aortic valve is tricuspid. Aortic valve regurgitation is not visualized.   5. The inferior vena cava is normal in size with greater than 50% respiratory variability, suggesting right atrial pressure of 3 mmHg.    NM Myocardial Perfusion SPECT multiple (stress and rest):   Cardiac Catheterization:  09/07/2023 Narrative This result has an attachment that is not available.   Ost LAD to Prox LAD lesion is 90% stenosed.   Non-stenotic Prox LAD to Mid LAD lesion was previously treated.   Non-stenotic  1st Diag lesion was previously treated.   Recommend dual antiplatelet therapy with Aspirin  81mg  daily and Clopidogrel  75mg  daily.  1.  Severe one-vessel CAD involving the ostial LAD 2.  Prior LAD/D1 stent widely patent 3.  Transfer to [facility] for high risk PCI      Holter:   Cardiac CT  Scan: 10/11/2022 IMPRESSION:  1. Coronary calcium  score of 210. This was 41st percentile for age  and sex matched control.   2. Normal coronary origin with right dominance.   3. LAD has calcified plaque proximally cannot rule out significant  obstruction due to lack of adequate opacification.   4. Reduced contrast given due to renal dysfunction impairing image  quality.   5. Consider alternative testing (SPECT/PET) if clinically indicated  to evaluate ischemia.    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  Patient had a history of non-Hodgkin's lymphoma requiring extensive radiation.  Sick sinus syndrome with permanent pacemaker in place.    Findings:   1.  The left ventricle is normal in cavity size and wall thickness.  Systolic function is mildly reduced, with mild global hypokinesis of all LV segments. The calculated LVEF 48% is (prior 48%).    2.  The right ventricle is normal in cavity size, wall thickness, and systolic function.     3.  Both atria are normal in size.   4. The aortic valve is trileaflet in morphology. There is no significant aortic valve stenosis or regurgitation. There is no significant valvular disease.    5. Delayed enhancement imaging is abnormal. There is a small focal area of fibrosis in the basal lateral wall. This does not represent MI but is otherwise nonspecific.   6.  There is no evidence of an intracardiac thrombus.   7. The pericardium is normal in thickness.  There is no pericardial effusion.   8. Pacemaker Parameters. Device:  MEDTRONIC MICRA AV2 ([id number])  All were implanted on 11/02/2022 The patient is not  pacemaker dependent.       Physical Exam   Triage Vital Signs: ED Triage Vitals  Encounter Vitals Group     BP 12/27/23 1437 (!) 104/59     Girls Systolic BP Percentile --      Girls Diastolic BP Percentile --      Boys Systolic BP Percentile --      Boys Diastolic BP Percentile --      Pulse Rate 12/27/23 1437 65     Resp 12/27/23 1437 18     Temp 12/27/23 1437 98 F (36.7 C)     Temp Source 12/27/23 1437 Oral     SpO2 12/27/23 1437 99 %     Weight 12/27/23 1439 179 lb (81.2 kg)     Height 12/27/23 1439 5' 8 (1.727 m)     Head Circumference --      Peak Flow --      Pain Score 12/27/23 1439 8     Pain Loc --      Pain Education --      Exclude from Growth Chart --     Most recent vital signs: Vitals:   12/27/23 1700 12/27/23 1730  BP: 139/63 137/71  Pulse: 66 93  Resp: 14 15  Temp:    SpO2: 98% 100%     General: Awake, no distress.  HEENT: Normocephalic, atraumatic, EOMI, PERRL CV:  Good peripheral perfusion. RRR, RP 2+.  Some tenderness in right chest wall.  No obvious deformities or paradoxical chest wall rise. Resp:  Normal effort. CTAB Abd:  No distention. Nontender to deep palpation throughout Other:  No lower extremity edema appreciated Neuro:  Face symmetric, moving all extremity spontaneously, no focal motor deficit appreciated   ED Results / Procedures / Treatments   Labs (all labs ordered are listed, but only abnormal results are displayed) Labs Reviewed  BASIC METABOLIC PANEL WITH GFR - Abnormal; Notable for the following components:      Result Value   Glucose, Bld 116 (*)    BUN 25 (*)    Creatinine, Ser 1.48 (*)    GFR, Estimated 48 (*)    All other components within normal limits  CBC - Abnormal; Notable for the following components:   RBC 3.45 (*)    Hemoglobin 9.9 (*)    HCT 31.6 (*)    All other components within normal limits  PROTIME-INR  BRAIN NATRIURETIC PEPTIDE  URINE DRUG SCREEN, QUALITATIVE ([facility] ONLY)  TROPONIN I (HIGH  SENSITIVITY)  TROPONIN I (HIGH SENSITIVITY)     EKG  Ecg = sinus rhythm, rate 68, no gross ST elevation or depression, no significant repolarization abnormality, left axis deviation, first-degree AV block, right bundle branch block.  No clear evidence of ischemia nor arrhythmia on my interpretation.   RADIOLOGY Radiology interpreted by myself and radiology report reviewed.  Chest x-ray with some cardiomegaly and possible left pleural effusion.  No obvious rib fractures or pneumothorax.  CT imaging with no underlying PE though multiple bilateral rib fractures.  No pneumothorax visualized.    PROCEDURES:  Critical Care performed: No  Procedures   MEDICATIONS ORDERED IN ED: Medications  ondansetron  (ZOFRAN ) injection 4 mg (has no administration in time range)  hydrALAZINE  (APRESOLINE ) injection 5 mg (has no administration in time range)  acetaminophen  (TYLENOL ) tablet 650 mg (has no administration in time range)  morphine  (PF) 2 MG/ML injection 2 mg (has no administration in time range)  oxyCODONE -acetaminophen  (PERCOCET/ROXICET) 5-325 MG per tablet 1 tablet (has no administration in time range)  lidocaine  (LIDODERM ) 5 % 1 patch (has no administration in time range)  sodium chloride  0.9 % bolus 250 mL (0 mLs Intravenous Stopped 12/27/23 1743)  morphine  (PF) 2 MG/ML injection 2 mg (2 mg Intravenous Given 12/27/23 1635)  acetaminophen  (TYLENOL ) tablet 1,000 mg (1,000 mg Oral Given 12/27/23 1635)  iohexol  (OMNIPAQUE ) 350 MG/ML injection 75 mL (75 mLs Intravenous Contrast Given 12/27/23 1713)  morphine  (PF) 4 MG/ML injection 4 mg (4 mg Intravenous Given 12/27/23 1811)     IMPRESSION / MDM / ASSESSMENT AND PLAN / ED COURSE  I reviewed the triage vital signs and the nursing notes.                              DDX/MDM/AP: Differential diagnosis includes, but is not limited to, vasovagal episode, consider transient arrhythmia such as V-fib or V. tach, consider underlying rib  fractures, pneumothorax, PE.  Considered but doubt intracranial hemorrhage.  Consider underlying electrolyte abnormality, anemia.  Plan: -Labs - Cardiac monitor EKG - Chest x-ray - CT head, CT PE - Anticipate admission  Patient's presentation is most consistent with acute presentation with potential threat to life or bodily function.  The  patient is on the cardiac monitor to evaluate for evidence of arrhythmia and/or significant heart rate changes.  ED course below.  Workup with bilateral anterior rib fractures, no underlying pneumothorax.  Admitted to hospitalist service given multiple rib fractures as well as concerning syncope versus brief cardiac respiratory.  Remains asymptomatic beyond associated pain from rib fractures and in normal sinus rhythm here in emergency department.  Pacemaker interrogation does not show any obvious events that was limited to single small page, unclear if full report is pending.   Clinical Course as of 12/27/23 1901  Tue Dec 27, 2023  1545 CBC with no leukocytosis, anemia within baseline range [MM]  1545 BMP with mild bump in creatinine, otherwise unremarkable [MM]  1546 Trop wnl [MM]  1546 CXR: IMPRESSION: 1. Mild cardiomegaly. 2. LEFT basilar opacity most likely due to atelectasis and possible minimal pleural effusion.   [MM]  1754 CTA chest: IMPRESSION: 1. Negative for acute pulmonary embolus. 2. Cardiomegaly. 3. Multiple bilateral anterior rib fractures. No pneumothorax. 4. Aortic atherosclerosis.  Aortic Atherosclerosis (ICD10-I70.0).   [MM]  1754 CTH: IMPRESSION: 1. No acute intracranial abnormality. 2. Chronic left precentral gyrus infarcts.   [MM]  1755 Hospitalist consult order placed  Patient updated, will order further pain medication [MM]    Clinical Course User Index [MM] Clarine Ozell LABOR, MD     FINAL CLINICAL IMPRESSION(S) / ED DIAGNOSES   Final diagnoses:  Loss of consciousness (HCC)  Closed fracture of  multiple ribs of both sides, initial encounter     Rx / DC Orders   ED Discharge Orders     None        Note:  This document was prepared using Dragon voice recognition software and may include unintentional dictation errors.   Clarine Ozell LABOR, MD 12/27/23 1901

## 2023-12-28 ENCOUNTER — Observation Stay

## 2023-12-28 ENCOUNTER — Observation Stay: Admit: 2023-12-28 | Discharge: 2023-12-28 | Disposition: A | Attending: Student

## 2023-12-28 DIAGNOSIS — I10 Essential (primary) hypertension: Secondary | ICD-10-CM | POA: Diagnosis not present

## 2023-12-28 DIAGNOSIS — R4189 Other symptoms and signs involving cognitive functions and awareness: Secondary | ICD-10-CM | POA: Diagnosis not present

## 2023-12-28 DIAGNOSIS — I429 Cardiomyopathy, unspecified: Secondary | ICD-10-CM | POA: Diagnosis not present

## 2023-12-28 DIAGNOSIS — S2243XS Multiple fractures of ribs, bilateral, sequela: Secondary | ICD-10-CM | POA: Diagnosis not present

## 2023-12-28 LAB — NM MYOCAR MULTI W/SPECT W/WALL MOTION / EF
Estimated workload: 1
Exercise duration (min): 1 min
Exercise duration (sec): 0 s
LV dias vol: 107 mL (ref 62–150)
LV sys vol: 56 mL (ref 4.2–5.8)
MPHR: 140 {beats}/min
Nuc Stress EF: 48 %
Peak HR: 97 {beats}/min
Percent HR: 69 %
Rest HR: 74 {beats}/min
Rest Nuclear Isotope Dose: 9.6 mCi
SDS: 0
SRS: 8
SSS: 2
ST Depression (mm): 0 mm
Stress Nuclear Isotope Dose: 32.5 mCi
TID: 1.09

## 2023-12-28 LAB — ECHOCARDIOGRAM COMPLETE
AR max vel: 1.85 cm2
AV Area VTI: 1.81 cm2
AV Area mean vel: 1.72 cm2
AV Mean grad: 3.3 mmHg
AV Peak grad: 5.4 mmHg
Ao pk vel: 1.17 m/s
Area-P 1/2: 7.66 cm2
Height: 68 in
MV VTI: 1.66 cm2
S' Lateral: 2.6 cm
Weight: 2864 [oz_av]

## 2023-12-28 LAB — BASIC METABOLIC PANEL WITH GFR
Anion gap: 10 (ref 5–15)
BUN: 29 mg/dL — ABNORMAL HIGH (ref 8–23)
CO2: 25 mmol/L (ref 22–32)
Calcium: 8.7 mg/dL — ABNORMAL LOW (ref 8.9–10.3)
Chloride: 102 mmol/L (ref 98–111)
Creatinine, Ser: 1.41 mg/dL — ABNORMAL HIGH (ref 0.61–1.24)
GFR, Estimated: 50 mL/min — ABNORMAL LOW (ref >60)
Glucose, Bld: 122 mg/dL — ABNORMAL HIGH (ref 70–99)
Potassium: 4.3 mmol/L (ref 3.5–5.1)
Sodium: 137 mmol/L (ref 135–145)

## 2023-12-28 LAB — URINE DRUG SCREEN, QUALITATIVE (ARMC ONLY)
Amphetamines, Ur Screen: NOT DETECTED
Barbiturates, Ur Screen: NOT DETECTED
Benzodiazepine, Ur Scrn: NOT DETECTED
Cannabinoid 50 Ng, Ur ~~LOC~~: NOT DETECTED
Cocaine Metabolite,Ur ~~LOC~~: NOT DETECTED
MDMA (Ecstasy)Ur Screen: NOT DETECTED
Methadone Scn, Ur: NOT DETECTED
Opiate, Ur Screen: POSITIVE — AB
Phencyclidine (PCP) Ur S: NOT DETECTED
Tricyclic, Ur Screen: NOT DETECTED

## 2023-12-28 LAB — CBC
HCT: 31.3 % — ABNORMAL LOW (ref 39.0–52.0)
Hemoglobin: 9.9 g/dL — ABNORMAL LOW (ref 13.0–17.0)
MCH: 28.8 pg (ref 26.0–34.0)
MCHC: 31.6 g/dL (ref 30.0–36.0)
MCV: 91 fL (ref 80.0–100.0)
Platelets: 264 K/uL (ref 150–400)
RBC: 3.44 MIL/uL — ABNORMAL LOW (ref 4.22–5.81)
RDW: 15.1 % (ref 11.5–15.5)
WBC: 13.4 K/uL — ABNORMAL HIGH (ref 4.0–10.5)
nRBC: 0 % (ref 0.0–0.2)

## 2023-12-28 LAB — CBG MONITORING, ED: Glucose-Capillary: 104 mg/dL — ABNORMAL HIGH (ref 70–99)

## 2023-12-28 MED ORDER — METOPROLOL SUCCINATE ER 25 MG PO TB24: 12.5000 mg | ORAL_TABLET | Freq: Every day | ORAL | Status: DC

## 2023-12-28 MED ORDER — ROSUVASTATIN CALCIUM 20 MG PO TABS
20.0000 mg | ORAL_TABLET | Freq: Every day | ORAL | Status: DC
  Administered 2023-12-29 – 2024-01-03 (×6): 20 mg via ORAL
  Filled 2023-12-28 (×6): qty 1

## 2023-12-28 MED ORDER — SODIUM CHLORIDE 0.9 % IV SOLN: INTRAVENOUS | Status: DC

## 2023-12-28 MED ORDER — SODIUM CHLORIDE 0.9 % IV BOLUS
500.0000 mL | Freq: Once | INTRAVENOUS | Status: AC
  Administered 2023-12-28: 500 mL via INTRAVENOUS

## 2023-12-28 MED ORDER — TECHNETIUM TC 99M TETROFOSMIN IV KIT
10.0000 | PACK | Freq: Once | INTRAVENOUS | Status: AC | PRN
Start: 2023-12-28 — End: 2023-12-28
  Administered 2023-12-28: 9.62 via INTRAVENOUS

## 2023-12-28 MED ORDER — TECHNETIUM TC 99M TETROFOSMIN IV KIT
30.0000 | PACK | Freq: Once | INTRAVENOUS | Status: AC | PRN
  Administered 2023-12-28: 32.49 via INTRAVENOUS

## 2023-12-28 MED ORDER — REGADENOSON 0.4 MG/5ML IV SOLN
0.4000 mg | Freq: Once | INTRAVENOUS | Status: AC
  Administered 2023-12-28: 0.4 mg via INTRAVENOUS

## 2023-12-28 NOTE — Progress Notes (Signed)
 Progress Note   Patient: Raymond Bennett. FMW:[id number] DOB: 1945-11-29 DOA: 12/27/2023     0 DOS: the patient was seen and examined on 12/28/2023   Brief hospital course:  78 y.o. 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   Per pt and his wife at bedside, pt was speaking with a lawn care provider in his yard around 2:00 PM, while talking with him started feeling suddenly unwell. Pt was assisted to the ground by a group of landscapers where pt then became unresponsive. It lasted for about 1-2 minutes.  No seizure activity. They did not feel a pulse so initiated CPR.  Patient spontaneously returned to consciousness during this.  Patient states that after the event, when he tried to stand up, he fell dizzy and lightheaded.  No unilateral numbness or tingling in extremities.  No facial droop or slurred speech.  He states that he did not have chest pain before the event, but complains of frontal chest wall pain after CPR. Patient does not have nausea, vomiting, diarrhea or abdominal pain.  No symptoms of UTI.  No feeling of pacemaker flareup.  No fever or chills.     Data reviewed independently and ED Course: pt was found to have trop 9 --> 8, WBC 9.1, stable renal function, BNP 58.4, INR 1.1.  Temperature normal, blood pressure 104/59, 137/71, heart rate 65-93, RR 15, oxygen saturation 100% on room air.  Patient is placed in the PCU for observation.   CT of head: 1. No acute intracranial abnormality. 2. Chronic left precentral gyrus infarcts.   CTA: 1. Negative for acute pulmonary embolus. 2. Cardiomegaly. 3. Multiple bilateral anterior rib fractures. No pneumothorax.  Acute right second,  third, fourth, fifth, 6, seventh anterior rib fractures. Acute left second, fourth, fifth, sixth anterior rib fractures. 4. Aortic atherosclerosis.   Chest x-ray: 1. Mild cardiomegaly. 2. LEFT basilar opacity most likely due to atelectasis and possible minimal pleural effusion.     EKG: I have personally reviewed.  Sinus rhythm, QTc 476, bifascicular block, poor R wave progression.   10/22.  Patient felt okay this morning.  Echocardiogram and stress test ordered by cardiology.  EF 40 to 45%.  Stress test low risk scan.  When working with physical therapy patient became lightheaded and nauseous.  A rapid response was called.  Patient could not stand long enough to check her blood pressure while standing.  Blood pressure high while lying back down.  Assessment and Plan: * Unresponsiveness Suspect orthostatic hypotension.  Will give a fluid bolus and IV fluid hydration.  Closed rib fracture Numerous rib fractures bilaterally secondary to CPR as outpatient.  Cardiomyopathy, idiopathic (HCC) EF slightly better than previous at 40 to 45%  Essential hypertension Need to check orthostatics.  Hyperlipidemia On Crestor   Chronic kidney disease, stage 3a (HCC) Continue to monitor.  Last creatinine 1.41 with a GFR 50  Iron deficiency anemia Hemoglobin 9.9.  Overweight (BMI 25.0-29.9) BMI 27.22        Subjective: Patient not feeling well after rapid response called.  Felt lightheaded and dizzy when standing up with physical therapy.  Had similar episode as outpatient and underwent CPR.  Physical Exam: Vitals:   12/28/23 1400 12/28/23 1417 12/28/23 1446  12/28/23 1448  BP: (!) 141/110   (!) 164/69  Pulse: 84  90   Resp: 18     Temp:  98.1 F (36.7 C)    TempSrc:  Oral    SpO2: 94%  100%   Weight:      Height:       Physical Exam HENT:     Head: Normocephalic.     Mouth/Throat:     Pharynx: No oropharyngeal exudate.  Eyes:     General: Lids are normal.      Conjunctiva/sclera: Conjunctivae normal.  Cardiovascular:     Rate and Rhythm: Normal rate and regular rhythm.     Heart sounds: Normal heart sounds, S1 normal and S2 normal.  Pulmonary:     Breath sounds: Examination of the right-lower field reveals decreased breath sounds. Examination of the left-lower field reveals decreased breath sounds. Decreased breath sounds present. No wheezing, rhonchi or rales.  Abdominal:     Palpations: Abdomen is soft.     Tenderness: There is no abdominal tenderness.  Musculoskeletal:     Right lower leg: No swelling.     Left lower leg: No swelling.  Skin:    General: Skin is warm.     Findings: No rash.  Neurological:     Mental Status: He is alert and oriented to person, place, and time.     Data Reviewed: White blood count 13.4, hemoglobin 9.9, platelet count 262, cr1.41 CT scan of the chest negative for pulmonary embolism multiple bilateral anterior rib fractures no pneumothorax cardiomegaly, CT scan of the head negative.  Family Communication: Spoke with wife at bedside twice  Disposition: Status is: Observation Since we had a rapid response called on him today, I will not send home.  Will downgrade to any medical bed since stress test negative.  Planned Discharge Destination: Home    Time spent: 38 minutes Cardiology  Author: Charlie Patterson, MD 12/28/2023 3:18 PM  For on call review [email or web address].

## 2023-12-28 NOTE — Assessment & Plan Note (Addendum)
 Hemoglobin 9.7.

## 2023-12-28 NOTE — Assessment & Plan Note (Addendum)
 Suspect orthostatic hypotension.  Will give a fluid bolus and IV fluid hydration.

## 2023-12-28 NOTE — ED Notes (Signed)
 Repeat EKG done at this time and given to MD [facility]

## 2023-12-28 NOTE — Assessment & Plan Note (Addendum)
 Numerous rib fractures bilaterally secondary to CPR as outpatient.  Currently and less pain and only taking Tylenol  for pain.

## 2023-12-28 NOTE — Evaluation (Signed)
 Physical Therapy Evaluation Patient Details Name: Raymond Bennett. MRN: [id number] DOB: 11-27-1945 Today's Date: 12/28/2023  History of Present Illness  78 y.o. 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  Clinical Impression  Pt was pleasant and despite a lot of pain from broken ribs was eager to try and get up with PT.  He needed a lot of cuing and guidance to stay on task but was able to participate.  He needed assist to get to sitting, but once up maintained balance relatively well.  On standing he maintained good O2 and HR, but started feeling lightheaded.  Triggered BP check but he needed to sit before it completed (after ~32minute upright).  On sitting BP was 73/52 and he started to feel nauseated, then unresponsive and PT assisted him to sidelying and called a rapid. Team arrived and pt appeared stable, further PT deferred at this time.  Pt will need continued PT to address functional limitations.        If plan is discharge home, recommend the following: A little help with walking and/or transfers;A little help with bathing/dressing/bathroom;Assist for transportation;Assistance with cooking/housework;Help with stairs or ramp for entrance   Can travel by private vehicle   No    Equipment Recommendations Rolling walker (2 wheels)  Recommendations for Other Services       Functional Status Assessment Patient has had a recent decline in their functional status and demonstrates the ability to make significant improvements in function in a reasonable and predictable amount of time.     Precautions / Restrictions  Precautions Precautions: Fall Recall of Precautions/Restrictions: Intact Restrictions Weight Bearing Restrictions Per Provider Order: No      Mobility  Bed Mobility Overal bed mobility: Needs Assistance Bed Mobility: Supine to Sit, Sit to Supine, Sidelying to Sit   Sidelying to sit: Contact guard assist Supine to sit: Min assist, Mod assist Sit to supine: Total assist   General bed mobility comments: Pt's rib pain stopped smooth transition to sitting, needed heavy HHA and trunk support, total assist back to bed 2/2 unresponsive    Transfers Overall transfer level: Needs assistance Equipment used: Rolling walker (2 wheels) Transfers: Sit to/from Stand Sit to Stand: Contact guard assist           General transfer comment: Pt showed good effort with getting up to standing, heavy UE use but did not need direct assist - though felt poorly At ~45 seconds of standing and needed to quickly sit/lay at ~1 minute.    Ambulation/Gait               General Gait Details: deferred, unsafe  Stairs            Wheelchair Mobility     Tilt Bed    Modified Rankin (Stroke Patients Only)       Balance Overall balance assessment: Needs assistance   Sitting balance-Leahy Scale: Good     Standing balance support: Bilateral upper extremity supported Standing balance-Leahy Scale: Fair Standing balance comment: realiant on walker today (does not normally need) after ~45 seconds leaning heavily, feeling lightheaded and needed assist to sit  Pertinent Vitals/Pain Pain Assessment Pain Assessment: 0-10 Pain Score: 8  Pain Location: b/l broken rib pain from CPR    Home Living Family/patient expects to be discharged to:: Private residence Living Arrangements: Spouse/significant other Available Help at Discharge: Available 24 hours/day               Additional Comments: did not get fully through home set up questions, etc  before needing to call a rapid    Prior Function Prior Level of Function : Independent/Modified Independent             Mobility Comments: reports he walks 1 mile in the morning and 1 in the afternoon.  Typically stays active ADLs Comments: relatively independent, wife helps as needed     Extremity/Trunk Assessment   Upper Extremity Assessment Upper Extremity Assessment: Generalized weakness;Overall [facility] for tasks assessed    Lower Extremity Assessment Lower Extremity Assessment: Generalized weakness;Overall WFL for tasks assessed       Communication   Communication Communication: No apparent difficulties    Cognition Arousal: Alert Behavior During Therapy: WFL for tasks assessed/performed   PT - Cognitive impairments: No apparent impairments                       PT - Cognition Comments: Pt quick to joke and speak off topic, not fully assessed 2/2 rapid Following commands: Intact       Cueing       General Comments General comments (skin integrity, edema, etc.): Pt was pleasant and despite rib pain eager to try and get up with PT.  He had what appeared to be a syncopal episode after ~1 minute of standing with unresponsiveness and drooling.  BP was 73/52, O2 did remain in the 90s on room air and HR went up but little more than 100 bpm    Exercises     Assessment/Plan    PT Assessment Patient needs continued PT services  PT Problem List Decreased strength;Decreased range of motion;Decreased activity tolerance;Decreased balance;Decreased mobility;Decreased knowledge of use of DME;Decreased safety awareness;Cardiopulmonary status limiting activity       PT Treatment Interventions DME instruction;Gait training;Functional mobility training;Therapeutic activities;Therapeutic exercise;Balance training;Patient/family education    PT Goals (Current goals can be found in the Care Plan section)  Acute Rehab PT Goals Patient Stated Goal: go home PT Goal  Formulation: With family Time For Goal Achievement: 01/10/24 Potential to Achieve Goals: Good    Frequency Min 2X/week     Co-evaluation               AM-PAC PT 6 Clicks Mobility  Outcome Measure Help needed turning from your back to your side while in a flat bed without using bedrails?: A Little Help needed moving from lying on your back to sitting on the side of a flat bed without using bedrails?: A Little Help needed moving to and from a bed to a chair (including a wheelchair)?: A Little Help needed standing up from a chair using your arms (e.g., wheelchair or bedside chair)?: A Little Help needed to walk in hospital room?: Total Help needed climbing 3-5 steps with a railing? : Total 6 Click Score: 14    End of Session   Activity Tolerance: Other (comment) Patient left: with nursing/sitter in room;with family/visitor present (rapid team in room) Nurse Communication: Mobility status PT Visit Diagnosis: Muscle weakness (generalized) (M62.81);Difficulty in walking, not elsewhere classified (R26.2)    Time: 1420-1440 PT Time Calculation (min) (ACUTE ONLY):  20 min   Charges:   PT Evaluation $PT Eval Low Complexity: 1 Low   PT General Charges $$ ACUTE PT VISIT: 1 Visit         Carmin JONELLE Deed, DPT 12/28/2023, 3:45 PM

## 2023-12-28 NOTE — ED Notes (Signed)
 Per PT, pt had syncopal episode during standing with PT.

## 2023-12-28 NOTE — Assessment & Plan Note (Signed)
 Continue to monitor.  Last creatinine 1.41 with a GFR 50

## 2023-12-28 NOTE — Assessment & Plan Note (Addendum)
 BMI 26.92

## 2023-12-28 NOTE — Progress Notes (Signed)
 OT Cancellation Note  Patient Details Name: Raymond Bennett. MRN: [id number] DOB: [birth year]   Cancelled Treatment:    Reason Eval/Treat Not Completed: Patient not medically ready. Orders received, pt pending cardio consultation with additional imaging ordered. OT will continue to follow and see at a later date/time as appropriate.   Zailah Zagami L. Caysie Minnifield, OTR/L  12/28/23, 10:39 AM

## 2023-12-28 NOTE — ED Notes (Signed)
 Delayed on administering Empagliflozin , Finasteride , Rosuvastatin  due to those medications being outside of this providers scope of practice. Advised the charge nurse and the nurse assigned to aide in the administration of the medications I am unable to give.

## 2023-12-28 NOTE — Consult Note (Signed)
 [facility] CLINIC CARDIOLOGY CONSULT NOTE       Patient ID: Jolee LULLA Emilio Mickey. MRN: [id number] DOB/AGE: April 24, 1945 78 y.o.  Admit date: 12/27/2023 Referring Physician Dr. Caleb Exon Primary Physician Everlene Parris LABOR, MD  Primary Cardiologist Dr. Florencio Reason for Consultation unresponsiveness, syncope  HPI: [place]. is a 78 y.o. 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 Cardiology was consulted for further evaluation.   Patient states that yesterday he went for a 1 mile walk, when he returned from this he ate lunch.  He went outside and sat down for roughly 20 minutes and stood up to talk to landscaper's.  After standing up he felt unwell and then dizzy, then had a loss of consciousness.  Reportedly the landscaper's were not able to find a pulse and so they did CPR for 1 to 2 minutes and he regained consciousness spontaneously.  Brought to the ED for evaluation.  Workup in the ED notable for creatinine 1.48, potassium 4.0, hemoglobin 9.9, WBC 9.7. Troponins 9 > 8, BNP 58. EKG in the ED NSR rate 68 bpm, no acute ischemic changes.  CT head in the ED with no acute abnormality, chronic left precentral gyrus infarcts (first noted 09/2023), CTA chest negative for acute PE.   At the time of my evaluation this morning, he is resting in hospital bed with wife at bedside.  States that overall he had been doing well recently prior to this episode.  Does endorse 1 day last month when he had chest pain for which he took multiple nitroglycerin  and came to the ED.  At that time EKG was nonacute and troponins were negative and he was discharged home to follow-up with Dr. Florencio outpatient.  States that since then he had not had any issues  with chest pain.  Denies any chest pain or pressure, shortness of breath prior to his episode yesterday.  States that in the past he has felt dizzy and lightheaded when he would go from sitting to standing after being seated for a long period.  Review of systems complete and found to be negative unless listed above    Past Medical History:  Diagnosis Date   Anemia    Basal cell carcinoma 01/17/2014   L lateral top of shoulder ant, L ant top of shoulder post   Basal cell carcinoma 01/01/2016   L mid back   Basal cell carcinoma 11/17/2017   R mid ear helix   Basal cell carcinoma 02/06/2019   L scalp/temple area within the hair above the sideburn/excision   Basal cell carcinoma 08/15/2019   Right chest medial infraclavicular. Superficial and nodular.    Basal cell carcinoma 08/15/2019   Left chest parasternal. Superficial.   Basal cell carcinoma 02/14/2020   L thigh above knee    Basal cell carcinoma 10/20/2022   R lat neck, EDC   Cancer (HCC)    Chronic kidney disease    renal cell carcinoma   Coronary artery disease    GERD (gastroesophageal reflux disease)    History of colon polyps    History of gastric polyp    History of hiatal hernia    Neuropathy    Non Hodgkin's lymphoma (HCC)    PONV (postoperative nausea and vomiting)  difficulty waking up after surgery   Reflux    Renal mass    SOBOE (shortness of breath on exertion) 03/09/2018    Past Surgical History:  Procedure Laterality Date   BONE MARROW TRANSPLANT     stem cell transplant at [facility] 30 years ago   CHOLECYSTECTOMY     COLONOSCOPY WITH PROPOFOL  N/A 02/11/2017   Procedure: COLONOSCOPY WITH PROPOFOL ;  Surgeon: Viktoria Lamar DASEN, MD;  Location: [facility] ENDOSCOPY;  Service: Endoscopy;  Laterality: N/A;   CORONARY STENT INTERVENTION N/A 06/22/2023   Procedure: CORONARY STENT INTERVENTION;  Surgeon: Katina Albright, MD;  Location: [facility] INVASIVE CV LAB;  Service: Cardiovascular;  Laterality: N/A;   CORONARY  ULTRASOUND/IVUS N/A 06/22/2023   Procedure: Coronary Ultrasound/IVUS;  Surgeon: Katina Albright, MD;  Location: [facility] INVASIVE CV LAB;  Service: Cardiovascular;  Laterality: N/A;   ESOPHAGOGASTRODUODENOSCOPY (EGD) WITH PROPOFOL  N/A 02/11/2017   Procedure: ESOPHAGOGASTRODUODENOSCOPY (EGD) WITH PROPOFOL ;  Surgeon: Viktoria Lamar DASEN, MD;  Location: [facility] ENDOSCOPY;  Service: Endoscopy;  Laterality: N/A;   FRACTURE SURGERY     GALLBLADDER SURGERY     HERNIA REPAIR Left    Inguinal Hernia Repair   LEFT HEART CATH AND CORONARY ANGIOGRAPHY Left 06/22/2023   Procedure: LEFT HEART CATH AND CORONARY ANGIOGRAPHY;  Surgeon: Katina Albright, MD;  Location: [facility] INVASIVE CV LAB;  Service: Cardiovascular;  Laterality: Left;   LEFT HEART CATH AND CORONARY ANGIOGRAPHY N/A 09/07/2023   Procedure: LEFT HEART CATH AND CORONARY ANGIOGRAPHY;  Surgeon: Katina Albright, MD;  Location: [facility] INVASIVE CV LAB;  Service: Cardiovascular;  Laterality: N/A;   PACEMAKER LEADLESS INSERTION N/A 11/02/2022   Procedure: PACEMAKER LEADLESS INSERTION;  Surgeon: Ammon Blunt, MD;  Location: [facility] INVASIVE CV LAB;  Service: Cardiovascular;  Laterality: N/A;   ROBOTIC ASSITED PARTIAL NEPHRECTOMY Left 11/12/2015   Procedure: ROBOTIC ASSITED PARTIAL NEPHRECTOMY;  Surgeon: Redell Lynwood Napoleon, MD;  Location: [facility] ORS;  Service: Urology;  Laterality: Left;   TONSILLECTOMY      (Not in a hospital admission)  Social History   Socioeconomic History   Marital status: Married    Spouse name: Erminio   Number of children: 0   Years of education: Not on file   Highest education level: Bachelor's degree (e.g., BA, AB, BS)  Occupational History   Not on file  Tobacco Use   Smoking status: Never   Smokeless tobacco: Never  Vaping Use   Vaping status: Never Used  Substance and Sexual Activity   Alcohol use: No   Drug use: No   Sexual activity: Not on file  Other Topics Concern   Not on file  Social History Narrative   Lives at home with wife     Social Drivers of Health   Financial Resource Strain: Low Risk  (09/12/2023)   Overall Financial Resource Strain (CARDIA)    Difficulty of Paying Living Expenses: Not hard at all  Food Insecurity: No Food Insecurity (09/12/2023)   Hunger Vital Sign    Worried About Running Out of Food in the Last Year: Never true    Ran Out of Food in the Last Year: Never true  Transportation Needs: No Transportation Needs (09/12/2023)   PRAPARE - Administrator, Civil Service (Medical): No    Lack of Transportation (Non-Medical): No  Physical Activity: Sufficiently Active (09/12/2023)   Exercise Vital Sign    Days of Exercise per Week: 7 days    Minutes of Exercise per Session: 30 min  Stress: No Stress Concern Present (09/12/2023)  [employer] of Occupational Health - Occupational Stress Questionnaire    Feeling of Stress: Not at all  Social Connections: Moderately Isolated (09/12/2023)   Social Connection and Isolation Panel    Frequency of Communication with Friends and Family: Once a week    Frequency of Social Gatherings with Friends and Family: Once a week    Attends Religious Services: 1 to 4 times per year    Active Member of [employer] or Organizations: No    Attends Banker Meetings: Not on file    Marital Status: Married  Catering manager Violence: Not At Risk (09/07/2023)   Humiliation, Afraid, Rape, and Kick questionnaire    Fear of Current or Ex-Partner: No    Emotionally Abused: No    Physically Abused: No    Sexually Abused: No    Family History  Problem Relation Age of Onset   Breast cancer Mother    Heart attack Father      Vitals:   12/28/23 0149 12/28/23 0400 12/28/23 0534 12/28/23 0706  BP:  115/64  132/60  Pulse:  65  73  Resp:  12  18  Temp: 98.3 F (36.8 C)  97.9 F (36.6 C)   TempSrc: Oral  Oral   SpO2:  99%  98%  Weight:      Height:        PHYSICAL EXAM General: Well-appearing male, well nourished, in no acute distress. HEENT:  Normocephalic and atraumatic. Neck: No JVD.  Lungs: Normal respiratory effort on room air. Clear bilaterally to auscultation. No wheezes, crackles, rhonchi.  Heart: HRRR. Normal S1 and S2 without gallops or murmurs.  Abdomen: Non-distended appearing.  Msk: Normal strength and tone for age. Extremities: Warm and well perfused. No clubbing, cyanosis.  No edema.  Neuro: Alert and oriented X 3. Psych: Answers questions appropriately.   Labs: Basic Metabolic Panel: Recent Labs    12/27/23 1448 12/28/23 0500  NA 138 137  K 4.0 4.3  CL 104 102  CO2 24 25  GLUCOSE 116* 122*  BUN 25* 29*  CREATININE 1.48* 1.41*  CALCIUM  8.9 8.7*   Liver Function Tests: No results for input(s): AST, ALT, ALKPHOS, BILITOT, PROT, ALBUMIN in the last 72 hours. No results for input(s): LIPASE, AMYLASE in the last 72 hours. CBC: Recent Labs    12/27/23 1448 12/28/23 0500  WBC 9.7 13.4*  HGB 9.9* 9.9*  HCT 31.6* 31.3*  MCV 91.6 91.0  PLT 257 264   Cardiac Enzymes: Recent Labs    12/27/23 1448 12/27/23 1634  TROPONINIHS 9 8   BNP: Recent Labs    12/27/23 1448  BNP 58.4   D-Dimer: No results for input(s): DDIMER in the last 72 hours. Hemoglobin A1C: No results for input(s): HGBA1C in the last 72 hours. Fasting Lipid Panel: No results for input(s): CHOL, HDL, LDLCALC, TRIG, CHOLHDL, LDLDIRECT in the last 72 hours. Thyroid Function Tests: No results for input(s): TSH, T4TOTAL, T3FREE, THYROIDAB in the last 72 hours.  Invalid input(s): FREET3 Anemia Panel: No results for input(s): VITAMINB12, FOLATE, FERRITIN, TIBC, IRON, RETICCTPCT in the last 72 hours.   Radiology: CT Angio Chest PE W and/or Wo Contrast Result Date: 12/27/2023 CLINICAL DATA:  Syncope versus cardiac arrest chest pain EXAM: CT ANGIOGRAPHY CHEST WITH CONTRAST TECHNIQUE: Multidetector CT imaging of the chest was performed using the standard protocol during bolus  administration of intravenous contrast. Multiplanar CT image reconstructions and MIPs were obtained to evaluate the vascular anatomy. RADIATION DOSE REDUCTION: This exam  was performed according to the departmental dose-optimization program which includes automated exposure control, adjustment of the mA and/or kV according to patient size and/or use of iterative reconstruction technique. CONTRAST:  75mL OMNIPAQUE  IOHEXOL  350 MG/ML SOLN COMPARISON:  Chest x-ray 12/27/2023, CT chest 12/08/2023 FINDINGS: Cardiovascular: Satisfactory opacification of the pulmonary arteries to the segmental level. No evidence of pulmonary embolism. Moderate aortic atherosclerosis. No aneurysm. Coronary vascular calcification. Mild cardiomegaly. No pericardial effusion. Lead less pacemaker in the right ventricle. Mediastinum/Nodes: Patent trachea. No thyroid mass. Subcentimeter mediastinal lymph nodes. Esophagus within normal limits. Lungs/Pleura: No acute airspace disease, pleural effusion or pneumothorax. Mild atelectasis in the lower lobes. Upper Abdomen: No acute finding. Musculoskeletal: Sternum appears intact. Acute right second, third, fourth, fifth, 6, seventh anterior rib fractures. Acute left second, fourth, fifth, sixth anterior rib fractures. Review of the MIP images confirms the above findings. IMPRESSION: 1. Negative for acute pulmonary embolus. 2. Cardiomegaly. 3. Multiple bilateral anterior rib fractures. No pneumothorax. 4. Aortic atherosclerosis. Aortic Atherosclerosis (ICD10-I70.0). Electronically Signed   By: Luke Bun M.D.   On: 12/27/2023 17:49   CT HEAD WO CONTRAST ( ) Result Date: 12/27/2023 EXAM: CT HEAD WITHOUT CONTRAST 12/27/2023 05:24:39 PM TECHNIQUE: CT of the head was performed without the administration of intravenous contrast. Automated exposure control, iterative reconstruction, and/or weight based adjustment of the mA/kV was utilized to reduce the radiation dose to as low as reasonably  achievable. COMPARISON: 09/07/2023 CLINICAL HISTORY: Syncope. Patient arrived via EMS for syncope versus cardiac arrest. Patient was outside and started to feel weak, assisted to the ground, then became unresponsive. CPR was initiated due to no pulse. Patient's only current complaint is chest pain from CPR. Patient states this happened in July when a stent was placed at [facility]. FINDINGS: BRAIN AND VENTRICLES: No acute hemorrhage. No evidence of acute infarct. Chronic infarct of left precentral gyrus. No hydrocephalus. No extra-axial collection. No mass effect or midline shift. Atherosclerotic calcifications in cavernous internal carotid arteries. ORBITS: Bilateral lens replacement. SINUSES: No acute abnormality. SOFT TISSUES AND SKULL: No acute soft tissue abnormality. No skull fracture. IMPRESSION: 1. No acute intracranial abnormality. 2. Chronic left precentral gyrus infarcts. Electronically signed by: Norman Gatlin MD 12/27/2023 05:44 PM EDT RP Workstation: [id number]   DG Chest 2 View Result Date: 12/27/2023 CLINICAL DATA:  Chest pain EXAM: CHEST - 2 VIEW COMPARISON:  12/06/2023 FINDINGS: Mild cardiomegaly. Implanted cardiac pacer again seen. No significant pulmonary vascular congestion. Patchy opacity at the LEFT lung base obscuring the LEFT hemidiaphragm is most likely be due to atelectasis. IMPRESSION: 1. Mild cardiomegaly. 2. LEFT basilar opacity most likely due to atelectasis and possible minimal pleural effusion. Electronically Signed   By: Aliene Lloyd M.D.   On: 12/27/2023 15:32   CT Angio Chest Pulmonary Embolism (PE) W or WO Contrast Result Date: 12/08/2023 EXAM: CTA CHEST 12/08/2023 10:32:54 AM TECHNIQUE: CTA of the chest was performed without and with the administration of 75 mL of iohexol  (OMNIPAQUE ) 350 MG/ML injection. Multiplanar reformatted images are provided for review. MIP images are provided for review. Automated exposure control, iterative reconstruction, and/or weight based  adjustment of the mA/kV was utilized to reduce the radiation dose to as low as reasonably achievable. COMPARISON: CT of the chest dated 10/11/2022. CLINICAL HISTORY: Pt c/o mid chest pain and pressure x 2 days. He also had a Elev D-Dimer. 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 Hx Pacemaker and Cardiac stents. FINDINGS: PULMONARY ARTERIES: Pulmonary arteries are adequately opacified for evaluation. No acute pulmonary embolus.  Main pulmonary artery is normal in caliber. MEDIASTINUM: The heart and pericardium demonstrate no acute abnormality. There is mild-to-moderate calcific plaque within the aortic arch. LYMPH NODES: No mediastinal, hilar or axillary lymphadenopathy. LUNGS AND PLEURA: The lungs are without acute process. No focal consolidation or pulmonary edema. No evidence of pleural effusion or pneumothorax. UPPER ABDOMEN: Limited images of the upper abdomen are unremarkable. SOFT TISSUES AND BONES: No acute bone or soft tissue abnormality. IMPRESSION: 1. No pulmonary embolism or acute pulmonary abnormality. 2. Mild-to-moderate calcific aortic arch atherosclerosis. Electronically signed by: Evalene Coho MD 12/08/2023 10:57 AM EDT RP Workstation: [id number]   DG Chest 2 View Result Date: 12/06/2023 CLINICAL DATA:  Left-sided chest pressure beginning this morning. EXAM: CHEST - 2 VIEW COMPARISON:  09/05/2023 FINDINGS: Lungs are adequately inflated and otherwise clear. Small cardiac device unchanged projecting over the left heart. Cardiomediastinal silhouette and remainder of the exam is unchanged. IMPRESSION: No active cardiopulmonary disease. Electronically Signed   By: Toribio Agreste M.D.   On: 12/06/2023 16:19    ECHO pending  TELEMETRY (personally reviewed): Normal sinus rhythm, bifascicular block rate 68 bpm  EKG (personally reviewed): NSR rate 68 bpm, no acute ischemic changes  Data reviewed by me 12/28/2023: last 24h vitals tele labs imaging I/O ED provider note, admission  H&P  Principal Problem:   Unresponsiveness Active Problems:   Cardiomyopathy, idiopathic (HCC)   Coronary artery disease   Essential hypertension   Hyperlipidemia   Iron deficiency anemia   Chronic kidney disease, stage 3a (HCC)   Overweight (BMI 25.0-29.9)   Closed rib fracture    ASSESSMENT AND PLAN:  Apolinar Bero. is a 78 y.o. 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 Cardiology was consulted for further evaluation.   # Syncope # Unresponsiveness # CAD s/p DES to LAD, LCx # S/p Micra leadless pacemaker 10/2022 # Idiopathic cardiomyopathy Patient with syncopal episode yesterday, no preceding chest pain or pressure.  Troponins normal x 2.  EKG without acute ischemic changes.  Micra pacemaker is not able to monitor for arrhythmias. - Echo pending. - Will obtain stress test today for additional evaluation given his significant cardiac history. - Continue Crestor  20 mg daily, aspirin  81 mg daily, Plavix  75 mg daily. - Continue metoprolol  succinate 25 mg daily, Jardiance  10 mg daily. - Will plan for Holter monitor on discharge for additional evaluation of arrhythmias.   This patient's plan of care was discussed and created with Dr. Wilburn and he is in agreement.  Signed: Danita Bloch, PA-C  12/28/2023, 7:25 AM [facility]

## 2023-12-28 NOTE — Assessment & Plan Note (Signed)
 Need to check orthostatics.

## 2023-12-28 NOTE — Significant Event (Addendum)
 Rapid Response Event Note   Reason for Call :  Orthostatic hypotension  Initial Focused Assessment:  Patient in bed surrounded by ED staff and patient's MD Dr. Josette. Staff at bedside stated no needs from rapid response at this time.  Interventions:  none  Plan of Care:  MD at bedside talking with patient and ED staff. Team will reach back out to rapid response if any further needs.   Event Summary:   MD Notified: Dr. Josette Call Time: 14:47 Arrival Time: 14:49 End Time: 14:50  Misha Vanoverbeke, LAURAINE NORRIS, RN

## 2023-12-28 NOTE — Progress Notes (Signed)
   12/28/23 1430  Spiritual Encounters  Type of Visit Initial  Care provided to: Pt and family  Referral source Code page  Reason for visit Code  OnCall Visit Yes   Chaplain responded to a RRT page.  Chaplain met patient's tearful wife outside of the room while staff worked with patient.  Chaplain offered a compassionate presence until patient was stabilized.  Chaplain met the patient as he shared funny stories. Patient seemed to have a great sense of humor.    Rev. Rana M. Nicholaus, M.Div. Chaplain Resident [facility]

## 2023-12-28 NOTE — Assessment & Plan Note (Addendum)
 EF slightly better than previous at 40 to 45%.  Can restart Jardiance  in a couple days.

## 2023-12-28 NOTE — Care Management Obs Status (Signed)
 MEDICARE OBSERVATION STATUS NOTIFICATION   Patient Details  Name: Raymond Bennett. MRN: [id number] Date of Birth: 04/16/1945   Medicare Observation Status Notification Given:  Chaney BRANDY CHRISTIANE LELON, CMA 12/28/2023, 4:22 PM

## 2023-12-28 NOTE — Hospital Course (Addendum)
 78 y.o. 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   Per pt and his wife at bedside, pt was speaking with a lawn care provider in his yard around 2:00 PM, while talking with him started feeling suddenly unwell. Pt was assisted to the ground by a group of landscapers where pt then became unresponsive. It lasted for about 1-2 minutes.  No seizure activity. They did not feel a pulse so initiated CPR.  Patient spontaneously returned to consciousness during this.  Patient states that after the event, when he tried to stand up, he fell dizzy and lightheaded.  No unilateral numbness or tingling in extremities.  No facial droop or slurred speech.  He states that he did not have chest pain before the event, but complains of frontal chest wall pain after CPR. Patient does not have nausea, vomiting, diarrhea or abdominal pain.  No symptoms of UTI.  No feeling of pacemaker flareup.  No fever or chills.     Data reviewed independently and ED Course: pt was found to have trop 9 --> 8, WBC 9.1, stable renal function, BNP 58.4, INR 1.1.  Temperature normal, blood pressure 104/59, 137/71, heart rate 65-93, RR 15, oxygen saturation 100% on room air.  Patient is placed in the PCU for observation.   CT of head: 1. No acute intracranial abnormality. 2. Chronic left precentral gyrus infarcts.   CTA: 1. Negative for acute pulmonary embolus. 2. Cardiomegaly. 3. Multiple bilateral anterior rib fractures. No pneumothorax.  Acute right second, third, fourth, fifth, 6, seventh anterior rib fractures. Acute left second, fourth, fifth, sixth anterior rib fractures. 4. Aortic atherosclerosis.   Chest x-ray: 1. Mild  cardiomegaly. 2. LEFT basilar opacity most likely due to atelectasis and possible minimal pleural effusion.      10/22.  Patient felt okay this morning.  Echocardiogram and stress test ordered by cardiology.  EF 40 to 45%.  Stress test low risk scan.  When working with physical therapy patient became lightheaded and nauseous.  A rapid response was called.  Patient could not stand long enough to check her blood pressure while standing.  Blood pressure high while lying back down. 10/23.  Patient orthostatic.  Started on midodrine  5 mg 3 times daily.  Continue IV fluids.  TED hose. 10/24.  Patient still orthostatic.  Increase midodrine  10 mg 3 times daily.  Continue IV fluids.  A.m. cortisol normal range. 10/25.  Start Florinef for orthostatic hypotension 10/26.  Increase Florinef to 0.2 mg daily.  With blood pressure dropping again with orthostatics today hide increase midodrine  to 15 mg 3 times daily and increase Florinef to 0.3 mg for tomorrow. 10/27.  Patient orthostatic again today with blood pressure dropping down into the 80s.  Increase Florinef to 0.4 mg for tomorrow. 10/28.  Patient's blood pressure dropped down into the 90s with standing.  Patient states he feels okay.  I ambulated the patient around the nursing station personally.  Patient felt okay.  Will discharge home.

## 2023-12-28 NOTE — Assessment & Plan Note (Signed)
 On Crestor 

## 2023-12-28 NOTE — Progress Notes (Signed)
*  PRELIMINARY RESULTS* Echocardiogram 2D Echocardiogram has been performed.  Floydene Harder 12/28/2023, 8:52 AM

## 2023-12-28 NOTE — ED Notes (Signed)
 Charge RN, MD Willo and this RN at bedside to respond to rapid. Pt alert at this time.

## 2023-12-29 DIAGNOSIS — I130 Hypertensive heart and chronic kidney disease with heart failure and stage 1 through stage 4 chronic kidney disease, or unspecified chronic kidney disease: Secondary | ICD-10-CM | POA: Diagnosis present

## 2023-12-29 DIAGNOSIS — N1831 Chronic kidney disease, stage 3a: Secondary | ICD-10-CM | POA: Diagnosis present

## 2023-12-29 DIAGNOSIS — R4020 Unspecified coma: Secondary | ICD-10-CM | POA: Diagnosis present

## 2023-12-29 DIAGNOSIS — Z7902 Long term (current) use of antithrombotics/antiplatelets: Secondary | ICD-10-CM | POA: Diagnosis not present

## 2023-12-29 DIAGNOSIS — I5032 Chronic diastolic (congestive) heart failure: Secondary | ICD-10-CM | POA: Diagnosis present

## 2023-12-29 DIAGNOSIS — E876 Hypokalemia: Secondary | ICD-10-CM | POA: Diagnosis present

## 2023-12-29 DIAGNOSIS — N179 Acute kidney failure, unspecified: Secondary | ICD-10-CM | POA: Diagnosis present

## 2023-12-29 DIAGNOSIS — Z905 Acquired absence of kidney: Secondary | ICD-10-CM | POA: Diagnosis not present

## 2023-12-29 DIAGNOSIS — I255 Ischemic cardiomyopathy: Secondary | ICD-10-CM | POA: Diagnosis present

## 2023-12-29 DIAGNOSIS — M96A3 Multiple fractures of ribs associated with chest compression and cardiopulmonary resuscitation: Secondary | ICD-10-CM | POA: Diagnosis present

## 2023-12-29 DIAGNOSIS — E663 Overweight: Secondary | ICD-10-CM

## 2023-12-29 DIAGNOSIS — I2510 Atherosclerotic heart disease of native coronary artery without angina pectoris: Secondary | ICD-10-CM | POA: Diagnosis present

## 2023-12-29 DIAGNOSIS — Z79899 Other long term (current) drug therapy: Secondary | ICD-10-CM | POA: Diagnosis not present

## 2023-12-29 DIAGNOSIS — R4189 Other symptoms and signs involving cognitive functions and awareness: Secondary | ICD-10-CM | POA: Diagnosis not present

## 2023-12-29 DIAGNOSIS — I452 Bifascicular block: Secondary | ICD-10-CM | POA: Diagnosis present

## 2023-12-29 DIAGNOSIS — N189 Chronic kidney disease, unspecified: Secondary | ICD-10-CM

## 2023-12-29 DIAGNOSIS — S2243XD Multiple fractures of ribs, bilateral, subsequent encounter for fracture with routine healing: Secondary | ICD-10-CM

## 2023-12-29 DIAGNOSIS — Z7984 Long term (current) use of oral hypoglycemic drugs: Secondary | ICD-10-CM | POA: Diagnosis not present

## 2023-12-29 DIAGNOSIS — Z85828 Personal history of other malignant neoplasm of skin: Secondary | ICD-10-CM | POA: Diagnosis not present

## 2023-12-29 DIAGNOSIS — I951 Orthostatic hypotension: Secondary | ICD-10-CM | POA: Diagnosis present

## 2023-12-29 DIAGNOSIS — Z8249 Family history of ischemic heart disease and other diseases of the circulatory system: Secondary | ICD-10-CM | POA: Diagnosis not present

## 2023-12-29 DIAGNOSIS — D509 Iron deficiency anemia, unspecified: Secondary | ICD-10-CM | POA: Diagnosis present

## 2023-12-29 DIAGNOSIS — I429 Cardiomyopathy, unspecified: Secondary | ICD-10-CM

## 2023-12-29 DIAGNOSIS — Z9481 Bone marrow transplant status: Secondary | ICD-10-CM | POA: Diagnosis not present

## 2023-12-29 DIAGNOSIS — Z85528 Personal history of other malignant neoplasm of kidney: Secondary | ICD-10-CM | POA: Diagnosis not present

## 2023-12-29 DIAGNOSIS — S2243XS Multiple fractures of ribs, bilateral, sequela: Secondary | ICD-10-CM | POA: Diagnosis not present

## 2023-12-29 DIAGNOSIS — Z7982 Long term (current) use of aspirin: Secondary | ICD-10-CM | POA: Diagnosis not present

## 2023-12-29 DIAGNOSIS — D508 Other iron deficiency anemias: Secondary | ICD-10-CM

## 2023-12-29 DIAGNOSIS — I700 Atherosclerosis of aorta: Secondary | ICD-10-CM | POA: Diagnosis present

## 2023-12-29 DIAGNOSIS — I495 Sick sinus syndrome: Secondary | ICD-10-CM | POA: Diagnosis present

## 2023-12-29 DIAGNOSIS — E785 Hyperlipidemia, unspecified: Secondary | ICD-10-CM

## 2023-12-29 LAB — CBC
HCT: 32.2 % — ABNORMAL LOW (ref 39.0–52.0)
Hemoglobin: 10.2 g/dL — ABNORMAL LOW (ref 13.0–17.0)
MCH: 28.7 pg (ref 26.0–34.0)
MCHC: 31.7 g/dL (ref 30.0–36.0)
MCV: 90.4 fL (ref 80.0–100.0)
Platelets: 245 K/uL (ref 150–400)
RBC: 3.56 MIL/uL — ABNORMAL LOW (ref 4.22–5.81)
RDW: 14.8 % (ref 11.5–15.5)
WBC: 11.3 K/uL — ABNORMAL HIGH (ref 4.0–10.5)
nRBC: 0 % (ref 0.0–0.2)

## 2023-12-29 LAB — BASIC METABOLIC PANEL WITH GFR
Anion gap: 10 (ref 5–15)
BUN: 29 mg/dL — ABNORMAL HIGH (ref 8–23)
CO2: 26 mmol/L (ref 22–32)
Calcium: 8.7 mg/dL — ABNORMAL LOW (ref 8.9–10.3)
Chloride: 103 mmol/L (ref 98–111)
Creatinine, Ser: 1.15 mg/dL (ref 0.61–1.24)
GFR, Estimated: >60 mL/min (ref >60)
Glucose, Bld: 93 mg/dL (ref 70–99)
Potassium: 4.5 mmol/L (ref 3.5–5.1)
Sodium: 139 mmol/L (ref 135–145)

## 2023-12-29 MED ORDER — MIDODRINE HCL 5 MG PO TABS
5.0000 mg | ORAL_TABLET | Freq: Three times a day (TID) | ORAL | Status: DC
  Administered 2023-12-29 – 2023-12-30 (×4): 5 mg via ORAL
  Filled 2023-12-29 (×4): qty 1

## 2023-12-29 MED ORDER — ENSURE PLUS HIGH PROTEIN PO LIQD
237.0000 mL | Freq: Two times a day (BID) | ORAL | Status: DC
Start: 2023-12-30 — End: 2024-01-03
  Administered 2023-12-30 – 2024-01-03 (×8): 237 mL via ORAL

## 2023-12-29 MED ORDER — SODIUM CHLORIDE 0.9 % IV SOLN: INTRAVENOUS | Status: DC

## 2023-12-29 NOTE — Progress Notes (Signed)
 Orders only for holter monitor.   CARALYN REBEKAH HUDSON, PA

## 2023-12-29 NOTE — Progress Notes (Signed)
 Progress Note   Patient: Raymond Bennett. FMW:[id number] DOB: 03-23-1945 DOA: 12/27/2023     0 DOS: the patient was seen and examined on 12/29/2023   Brief hospital course:  78 y.o. 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   Per pt and his wife at bedside, pt was speaking with a lawn care provider in his yard around 2:00 PM, while talking with him started feeling suddenly unwell. Pt was assisted to the ground by a group of landscapers where pt then became unresponsive. It lasted for about 1-2 minutes.  No seizure activity. They did not feel a pulse so initiated CPR.  Patient spontaneously returned to consciousness during this.  Patient states that after the event, when he tried to stand up, he fell dizzy and lightheaded.  No unilateral numbness or tingling in extremities.  No facial droop or slurred speech.  He states that he did not have chest pain before the event, but complains of frontal chest wall pain after CPR. Patient does not have nausea, vomiting, diarrhea or abdominal pain.  No symptoms of UTI.  No feeling of pacemaker flareup.  No fever or chills.     Data reviewed independently and ED Course: pt was found to have trop 9 --> 8, WBC 9.1, stable renal function, BNP 58.4, INR 1.1.  Temperature normal, blood pressure 104/59, 137/71, heart rate 65-93, RR 15, oxygen saturation 100% on room air.  Patient is placed in the PCU for observation.   CT of head: 1. No acute intracranial abnormality. 2. Chronic left precentral gyrus infarcts.   CTA: 1. Negative for acute pulmonary embolus. 2. Cardiomegaly. 3. Multiple bilateral anterior rib fractures. No pneumothorax.  Acute right second,  third, fourth, fifth, 6, seventh anterior rib fractures. Acute left second, fourth, fifth, sixth anterior rib fractures. 4. Aortic atherosclerosis.   Chest x-ray: 1. Mild cardiomegaly. 2. LEFT basilar opacity most likely due to atelectasis and possible minimal pleural effusion.     EKG: I have personally reviewed.  Sinus rhythm, QTc 476, bifascicular block, poor R wave progression.   10/22.  Patient felt okay this morning.  Echocardiogram and stress test ordered by cardiology.  EF 40 to 45%.  Stress test low risk scan.  When working with physical therapy patient became lightheaded and nauseous.  A rapid response was called.  Patient could not stand long enough to check her blood pressure while standing.  Blood pressure high while lying back down.  Assessment and Plan: * Orthostatic hypotension With blood pressure dropping down into the 60s with standing, will need to start midodrine .  Continue IV fluid hydration.  Hold Toprol .  Unresponsiveness Secondary to orthostatic hypotension.  Closed rib fracture Numerous rib fractures bilaterally secondary to CPR as outpatient.  Acute kidney injury superimposed on CKD AKI on CKD stage III A.  Creatinine improved from 1.48 down to 1.15 with IV fluids  Cardiomyopathy, idiopathic (HCC) EF slightly better than previous at 40 to 45%  Essential hypertension Hold Toprol   Hyperlipidemia On Crestor   Iron deficiency anemia Hemoglobin 9.9.  Overweight (BMI 25.0-29.9) BMI 27.22        Subjective: Patient feels okay lying down but feels uncomfortable sitting up with rib pain from the fractures.  With standing  blood pressure dropped down into the 60s.  Physical Exam: Vitals:   12/28/23 1945 12/28/23 2017 12/29/23 0438 12/29/23 0751  BP:  (!) 146/73 (!) 163/82 (!) 168/82  Pulse: 85 84 74 82  Resp: 19 20 20 16   Temp:  97.6 F (36.4 C) 98.1 F (36.7 C) 98.2 F (36.8 C)  TempSrc:      SpO2: 96% 99% 97% 95%  Weight:      Height:        Physical Exam HENT:     Head: Normocephalic.     Mouth/Throat:     Pharynx: No oropharyngeal exudate.  Eyes:     General: Lids are normal.     Conjunctiva/sclera: Conjunctivae normal.  Cardiovascular:     Rate and Rhythm: Normal rate and regular rhythm.     Heart sounds: Normal heart sounds, S1 normal and S2 normal.  Pulmonary:     Breath sounds: Examination of the right-lower field reveals decreased breath sounds. Examination of the left-lower field reveals decreased breath sounds. Decreased breath sounds present. No wheezing, rhonchi or rales.  Abdominal:     Palpations: Abdomen is soft.     Tenderness: There is no abdominal tenderness.  Musculoskeletal:     Right lower leg: No swelling.     Left lower leg: No swelling.  Skin:    General: Skin is warm.     Findings: No rash.  Neurological:     Mental Status: He is alert and oriented to person, place, and time.     Data Reviewed: Creatinine 1.15, hemoglobin 10.2, white blood cell 11.3, platelet count 245  Family Communication: Wife at bedside  Disposition: Status is: Inpatient Remains inpatient appropriate because: Patient has severe orthostatic hypotension.  Will start midodrine  and titrate.  TED hose.  Continue IV fluids today.  Check an a.m. cortisol tomorrow  Planned Discharge Destination: To be determined based on physical therapy progress    Time spent: 28 minutes  Author: Charlie Patterson, MD 12/29/2023 2:17 PM  For on call review [email or web address].

## 2023-12-29 NOTE — Assessment & Plan Note (Addendum)
 With blood pressure dropping down into the 70s with standing.  Increasing midodrine  to 10 mg 3 times daily and will let fluids run out today.  Hold Toprol .  TED hose.   A.m. cortisol normal range.  Start Florinef.  Hold Jardiance .

## 2023-12-29 NOTE — Assessment & Plan Note (Addendum)
 AKI on CKD stage III A.  Creatinine improved from 1.48 down to 1.15 with IV fluids.  Today's creatinine 1.21.  Patient must eat.

## 2023-12-29 NOTE — Progress Notes (Signed)
 [facility] CLINIC CARDIOLOGY PROGRESS NOTE       Patient ID: Raymond Bennett. MRN: [id number] DOB/AGE: 80-Apr-1947 80 y.o.  Admit date: 12/27/2023 Referring Physician Dr. Caleb Exon Primary Physician Everlene Parris LABOR, MD  Primary Cardiologist Dr. Florencio Reason for Consultation unresponsiveness, syncope  HPI: Raymond Bennett. is a 78 y.o. 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 Cardiology was consulted for further evaluation.   Interval history: -Patient seen and examined this AM, resting in hospital bed with wife at bedside.  -No events noted on tele. BP stable, has not been OOB much.  -Still with chest discomfort 2/2 rib fractures.  -Reviewed echo and stress test results.   Review of systems complete and found to be negative unless listed above    Past Medical History:  Diagnosis Date   Anemia    Basal cell carcinoma 01/17/2014   L lateral top of shoulder ant, L ant top of shoulder post   Basal cell carcinoma 01/01/2016   L mid back   Basal cell carcinoma 11/17/2017   R mid ear helix   Basal cell carcinoma 02/06/2019   L scalp/temple area within the hair above the sideburn/excision   Basal cell carcinoma 08/15/2019   Right chest medial infraclavicular. Superficial and nodular.    Basal cell carcinoma 08/15/2019   Left chest parasternal. Superficial.   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   CHOLECYSTECTOMY     COLONOSCOPY WITH PROPOFOL  N/A 02/11/2017   Procedure: COLONOSCOPY WITH PROPOFOL ;  Surgeon: Viktoria Lamar DASEN, MD;  Location: [facility] ENDOSCOPY;  Service: Endoscopy;  Laterality: N/A;   CORONARY STENT INTERVENTION N/A 06/22/2023   Procedure: CORONARY STENT INTERVENTION;  Surgeon: Katina Albright, MD;  Location: [facility] INVASIVE CV LAB;  Service: Cardiovascular;  Laterality: N/A;   CORONARY ULTRASOUND/IVUS N/A 06/22/2023   Procedure: Coronary Ultrasound/IVUS;  Surgeon: Katina Albright, MD;  Location: [facility] INVASIVE CV LAB;  Service: Cardiovascular;  Laterality: N/A;   ESOPHAGOGASTRODUODENOSCOPY (EGD) WITH PROPOFOL  N/A 02/11/2017   Procedure: ESOPHAGOGASTRODUODENOSCOPY (EGD) WITH PROPOFOL ;  Surgeon: Viktoria Lamar DASEN, MD;  Location: [facility] ENDOSCOPY;  Service: Endoscopy;  Laterality: N/A;   FRACTURE SURGERY     GALLBLADDER SURGERY     HERNIA REPAIR Left    Inguinal Hernia Repair   LEFT HEART CATH AND CORONARY ANGIOGRAPHY Left 06/22/2023   Procedure: LEFT HEART CATH AND CORONARY ANGIOGRAPHY;  Surgeon: Katina Albright, MD;  Location: [facility] INVASIVE CV LAB;  Service: Cardiovascular;  Laterality:  Left;   LEFT HEART CATH AND CORONARY ANGIOGRAPHY N/A 09/07/2023   Procedure: LEFT HEART CATH AND CORONARY ANGIOGRAPHY;  Surgeon: Katina Albright, MD;  Location: [facility] INVASIVE CV LAB;  Service: Cardiovascular;  Laterality: N/A;   PACEMAKER LEADLESS INSERTION N/A 11/02/2022   Procedure: PACEMAKER LEADLESS INSERTION;  Surgeon: Ammon Blunt, MD;  Location: [facility] INVASIVE CV LAB;  Service: Cardiovascular;  Laterality: N/A;   ROBOTIC ASSITED PARTIAL NEPHRECTOMY Left 11/12/2015   Procedure: ROBOTIC  ASSITED PARTIAL NEPHRECTOMY;  Surgeon: Redell Lynwood Napoleon, MD;  Location: [facility] ORS;  Service: Urology;  Laterality: Left;   TONSILLECTOMY      Medications Prior to Admission  Medication Sig Dispense Refill Last Dose/Taking   aspirin  EC 81 MG tablet Take 1 tablet (81 mg total) by mouth daily. Swallow whole.   12/27/2023   clopidogrel  (PLAVIX ) 75 MG tablet Take 1 tablet (75 mg total) by mouth daily. 30 tablet 0 12/27/2023   empagliflozin  (JARDIANCE ) 10 MG TABS tablet Take 1 tablet by mouth daily.   12/27/2023   ferrous sulfate  325 (65 FE) MG tablet Take 325 mg by mouth in the morning.   12/27/2023   finasteride  (PROPECIA ) 1 MG tablet Take 1 tablet by mouth every morning.   12/27/2023   isosorbide mononitrate (IMDUR) 30 MG 24 hr tablet Take 1 tablet (30 mg total) by mouth daily. 30 tablet 0 12/27/2023   magnesium  oxide (MAG-OX) 400 MG tablet Take 400 mg by mouth daily.   12/27/2023   metoprolol  succinate (TOPROL -XL) 25 MG 24 hr tablet Take 25 mg by mouth daily.   12/27/2023   Multiple Vitamins-Minerals (PRESERVISION AREDS 2) CAPS Take 1 capsule by mouth 2 (two) times daily.   12/27/2023   nitroGLYCERIN  (NITROSTAT ) 0.4 MG SL tablet Place 0.4 mg under the tongue every 5 (five) minutes x 3 doses as needed for chest pain.   Taking As Needed   omeprazole (PRILOSEC) 40 MG capsule Take 40 mg by mouth daily before breakfast. Reported on 08/06/2015   12/27/2023   rosuvastatin  (CRESTOR ) 20 MG tablet Take 20 mg by mouth daily.   12/27/2023   vitamin B-12 (CYANOCOBALAMIN ) 1000 MCG tablet Take 1,000 mcg by mouth in the morning.   12/27/2023   Cholecalciferol  25 MCG (1000 UT) tablet Take 1,000 Units by mouth daily.      Probiotic Product (PROBIOTIC PO) Take 1 capsule by mouth daily.      Social History   Socioeconomic History   Marital status: Married    Spouse name: Erminio   Number of children: 0   Years of education: Not on file   Highest education level: Bachelor's degree (e.g., BA, AB, BS)   Occupational History   Not on file  Tobacco Use   Smoking status: Never   Smokeless tobacco: Never  Vaping Use   Vaping status: Never Used  Substance and Sexual Activity   Alcohol use: No   Drug use: No   Sexual activity: Not on file  Other Topics Concern   Not on file  Social History Narrative   Lives at home with wife    Social Drivers of Health   Financial Resource Strain: Low Risk  (09/12/2023)   Overall Financial Resource Strain (CARDIA)    Difficulty of Paying Living Expenses: Not hard at all  Food Insecurity: No Food Insecurity (12/28/2023)   Hunger Vital Sign    Worried About Running Out of Food in the Last Year: Never true    Ran Out of Food in the Last  Year: Never true  Transportation Needs: No Transportation Needs (12/28/2023)   PRAPARE - Administrator, Civil Service (Medical): No    Lack of Transportation (Non-Medical): No  Physical Activity: Sufficiently Active (09/12/2023)   Exercise Vital Sign    Days of Exercise per Week: 7 days    Minutes of Exercise per Session: 30 min  Stress: No Stress Concern Present (09/12/2023)   [employer] of Occupational Health - Occupational Stress Questionnaire    Feeling of Stress: Not at all  Social Connections: Socially Integrated (12/28/2023)   Social Connection and Isolation Panel    Frequency of Communication with Friends and Family: More than three times a week    Frequency of Social Gatherings with Friends and Family: More than three times a week    Attends Religious Services: 1 to 4 times per year    Active Member of Clubs or Organizations: Yes    Attends Banker Meetings: More than 4 times per year    Marital Status: Married  Catering manager Violence: Not At Risk (12/28/2023)   Humiliation, Afraid, Rape, and Kick questionnaire    Fear of Current or Ex-Partner: No    Emotionally Abused: No    Physically Abused: No    Sexually Abused: No    Family History  Problem Relation Age of Onset    Breast cancer Mother    Heart attack Father      Vitals:   12/28/23 1947 12/28/23 2017 12/29/23 0438 12/29/23 0751  BP:  (!) 146/73 (!) 163/82 (!) 168/82  Pulse: 85 84 74 82  Resp: 19 20 20 16   Temp:  97.6 F (36.4 C) 98.1 F (36.7 C) 98.2 F (36.8 C)  TempSrc:      SpO2: 96% 99% 97% 95%  Weight:      Height:        PHYSICAL EXAM General: Well-appearing male, well nourished, in no acute distress. HEENT: Normocephalic and atraumatic. Neck: No JVD.  Lungs: Normal respiratory effort on room air. Clear bilaterally to auscultation. No wheezes, crackles, rhonchi.  Heart: HRRR. Normal S1 and S2 without gallops or murmurs.  Abdomen: Non-distended appearing.  Msk: Normal strength and tone for age. Extremities: Warm and well perfused. No clubbing, cyanosis.  No edema.  Neuro: Alert and oriented X 3. Psych: Answers questions appropriately.   Labs: Basic Metabolic Panel: Recent Labs    12/28/23 0500 12/29/23 0348  NA 137 139  K 4.3 4.5  CL 102 103  CO2 25 26  GLUCOSE 122* 93  BUN 29* 29*  CREATININE 1.41* 1.15  CALCIUM  8.7* 8.7*   Liver Function Tests: No results for input(s): AST, ALT, ALKPHOS, BILITOT, PROT, ALBUMIN in the last 72 hours. No results for input(s): LIPASE, AMYLASE in the last 72 hours. CBC: Recent Labs    12/28/23 0500 12/29/23 0348  WBC 13.4* 11.3*  HGB 9.9* 10.2*  HCT 31.3* 32.2*  MCV 91.0 90.4  PLT 264 245   Cardiac Enzymes: Recent Labs    12/27/23 1448 12/27/23 1634  TROPONINIHS 9 8   BNP: Recent Labs    12/27/23 1448  BNP 58.4   D-Dimer: No results for input(s): DDIMER in the last 72 hours. Hemoglobin A1C: No results for input(s): HGBA1C in the last 72 hours. Fasting Lipid Panel: No results for input(s): CHOL, HDL, LDLCALC, TRIG, CHOLHDL, LDLDIRECT in the last 72 hours. Thyroid Function Tests: No results for input(s): TSH, T4TOTAL, T3FREE, THYROIDAB in the last 72 hours.  Invalid  input(s): FREET3 Anemia Panel: No results for input(s): VITAMINB12, FOLATE, FERRITIN, TIBC, IRON, RETICCTPCT in the last 72 hours.   Radiology: NM Myocar Multi W/Spect W/Wall Motion / EF Result Date: 12/28/2023   The study is normal. The study is low risk.   LV perfusion is normal. There is no evidence of ischemia. There is no evidence of infarction.   Left ventricular function is abnormal. Global function is mildly reduced. Nuclear stress EF: 48%.   ECHOCARDIOGRAM COMPLETE Result Date: 12/28/2023    ECHOCARDIOGRAM REPORT   Patient Name:   Raymond Bennett. Date of Exam: 12/28/2023 Medical Rec #:  [id number]        Height:       68.0 in Accession #:    [id number]       Weight:       179.0 lb Date of Birth:  05/29/1945         BSA:          1.950 m Patient Age:    78 years         BP:           150/63 mmHg Patient Gender: M                HR:           64 bpm. Exam Location:  [facility] Procedure: 2D Echo, Cardiac Doppler and Color Doppler (Both Spectral and Color            Flow Doppler were utilized during procedure). Indications:     Syncope R55  History:         Patient has prior history of Echocardiogram examinations, most                  recent 09/06/2023. CAD. Shortness of breath on exertion.  Sonographer:     Christopher Furnace Referring Phys:  [id number] Zakkiyya Barno Diagnosing Phys: Keller Alluri IMPRESSIONS  1. Left ventricular ejection fraction, by estimation, is 40 to 45%. The left ventricle has mildly decreased function. Left ventricular endocardial border not optimally defined to evaluate regional wall motion. There is mild left ventricular hypertrophy.  Left ventricular diastolic parameters are consistent with Grade I diastolic dysfunction (impaired relaxation).  2. Right ventricular systolic function is normal. The right ventricular size is normal.  3. The mitral valve is normal in structure. Trivial mitral valve regurgitation.  4. The aortic valve is tricuspid. There is mild thickening of the  aortic valve. Aortic valve regurgitation is trivial.  5. The inferior vena cava is normal in size with greater than 50% respiratory variability, suggesting right atrial pressure of 3 mmHg. FINDINGS  Left Ventricle: Left ventricular ejection fraction, by estimation, is 40 to 45%. The left ventricle has mildly decreased function. Left ventricular endocardial border not optimally defined to evaluate regional wall motion. The left ventricular internal cavity size was normal in size. There is mild left ventricular hypertrophy. Left ventricular diastolic parameters are consistent with Grade I diastolic dysfunction (impaired relaxation).  LV Wall Scoring: The mid anterolateral segment, mid inferoseptal segment, and mid anterior segment are hypokinetic. The inferior wall, basal anteroseptal segment, basal inferolateral segment, basal anterolateral segment, basal anterior segment, and basal inferoseptal segment are normal. Right Ventricle: The right ventricular size is normal. No increase in right ventricular wall thickness. Right ventricular systolic function is normal. Left Atrium: Left atrial size was normal in size. Right Atrium: Right atrial size was normal in size. Pericardium: There is no evidence of pericardial  effusion. Mitral Valve: The mitral valve is normal in structure. Mild mitral annular calcification. Trivial mitral valve regurgitation. MV peak gradient, 10.0 mmHg. The mean mitral valve gradient is 5.0 mmHg. Tricuspid Valve: The tricuspid valve is normal in structure. Tricuspid valve regurgitation is trivial. Aortic Valve: The aortic valve is tricuspid. There is mild thickening of the aortic valve. Aortic valve regurgitation is trivial. Aortic valve mean gradient measures 3.3 mmHg. Aortic valve peak gradient measures 5.4 mmHg. Aortic valve area, by VTI measures 1.81 cm. Pulmonic Valve: The pulmonic valve was not well visualized. Pulmonic valve regurgitation is trivial. Aorta: The aortic root and ascending  aorta are structurally normal, with no evidence of dilitation. Venous: The inferior vena cava is normal in size with greater than 50% respiratory variability, suggesting right atrial pressure of 3 mmHg. IAS/Shunts: The atrial septum is grossly normal.  LEFT VENTRICLE PLAX 2D LVIDd:         3.70 cm   Diastology LVIDs:         2.60 cm   LV e' medial:    4.13 cm/s LV PW:         1.30 cm   LV E/e' medial:  23.7 LV IVS:        1.20 cm   LV e' lateral:   7.72 cm/s LVOT diam:     2.00 cm   LV E/e' lateral: 12.7 LV SV:         48 LV SV Index:   24 LVOT Area:     3.14 cm  RIGHT VENTRICLE RV Basal diam:  3.10 cm RV Mid diam:    2.80 cm LEFT ATRIUM           Index        RIGHT ATRIUM           Index LA diam:      3.00 cm 1.54 cm/m   RA Area:     11.50 cm LA Vol (A4C): 23.3 ml 11.95 ml/m  RA Volume:   21.60 ml  11.08 ml/m  AORTIC VALVE AV Area (Vmax):    1.85 cm AV Area (Vmean):   1.72 cm AV Area (VTI):     1.81 cm AV Vmax:           116.67 cm/s AV Vmean:          82.633 cm/s AV VTI:            0.264 m AV Peak Grad:      5.4 mmHg AV Mean Grad:      3.3 mmHg LVOT Vmax:         68.70 cm/s LVOT Vmean:        45.200 cm/s LVOT VTI:          0.152 m LVOT/AV VTI ratio: 0.58  AORTA Ao Root diam: 2.90 cm MITRAL VALVE                TRICUSPID VALVE MV Area (PHT): 7.66 cm     TR Peak grad:   21.3 mmHg MV Area VTI:   1.66 cm     TR Vmax:        231.00 cm/s MV Peak grad:  10.0 mmHg MV Mean grad:  5.0 mmHg     SHUNTS MV Vmax:       1.58 m/s     Systemic VTI:  0.15 m MV Vmean:      109.0 cm/s   Systemic Diam: 2.00 cm MV Decel Time: 99  msec MV E velocity: 98.00 cm/s MV A velocity: 155.00 cm/s MV E/A ratio:  0.63 Keller Paterson Electronically signed by Keller Paterson Signature Date/Time: 12/28/2023/12:02:44 PM    Final    CT Angio Chest PE W and/or Wo Contrast Result Date: 12/27/2023 CLINICAL DATA:  Syncope versus cardiac arrest chest pain EXAM: CT ANGIOGRAPHY CHEST WITH CONTRAST TECHNIQUE: Multidetector CT imaging of the chest  was performed using the standard protocol during bolus administration of intravenous contrast. Multiplanar CT image reconstructions and MIPs were obtained to evaluate the vascular anatomy. RADIATION DOSE REDUCTION: This exam was performed according to the departmental dose-optimization program which includes automated exposure control, adjustment of the mA and/or kV according to patient size and/or use of iterative reconstruction technique. CONTRAST:  75mL OMNIPAQUE  IOHEXOL  350 MG/ML SOLN COMPARISON:  Chest x-ray 12/27/2023, CT chest 12/08/2023 FINDINGS: Cardiovascular: Satisfactory opacification of the pulmonary arteries to the segmental level. No evidence of pulmonary embolism. Moderate aortic atherosclerosis. No aneurysm. Coronary vascular calcification. Mild cardiomegaly. No pericardial effusion. Lead less pacemaker in the right ventricle. Mediastinum/Nodes: Patent trachea. No thyroid mass. Subcentimeter mediastinal lymph nodes. Esophagus within normal limits. Lungs/Pleura: No acute airspace disease, pleural effusion or pneumothorax. Mild atelectasis in the lower lobes. Upper Abdomen: No acute finding. Musculoskeletal: Sternum appears intact. Acute right second, third, fourth, fifth, 6, seventh anterior rib fractures. Acute left second, fourth, fifth, sixth anterior rib fractures. Review of the MIP images confirms the above findings. IMPRESSION: 1. Negative for acute pulmonary embolus. 2. Cardiomegaly. 3. Multiple bilateral anterior rib fractures. No pneumothorax. 4. Aortic atherosclerosis. Aortic Atherosclerosis (ICD10-I70.0). Electronically Signed   By: Luke Bun M.D.   On: 12/27/2023 17:49   CT HEAD WO CONTRAST ( ) Result Date: 12/27/2023 EXAM: CT HEAD WITHOUT CONTRAST 12/27/2023 05:24:39 PM TECHNIQUE: CT of the head was performed without the administration of intravenous contrast. Automated exposure control, iterative reconstruction, and/or weight based adjustment of the mA/kV was utilized to  reduce the radiation dose to as low as reasonably achievable. COMPARISON: 09/07/2023 CLINICAL HISTORY: Syncope. Patient arrived via EMS for syncope versus cardiac arrest. Patient was outside and started to feel weak, assisted to the ground, then became unresponsive. CPR was initiated due to no pulse. Patient's only current complaint is chest pain from CPR. Patient states this happened in July when a stent was placed at [facility]. FINDINGS: BRAIN AND VENTRICLES: No acute hemorrhage. No evidence of acute infarct. Chronic infarct of left precentral gyrus. No hydrocephalus. No extra-axial collection. No mass effect or midline shift. Atherosclerotic calcifications in cavernous internal carotid arteries. ORBITS: Bilateral lens replacement. SINUSES: No acute abnormality. SOFT TISSUES AND SKULL: No acute soft tissue abnormality. No skull fracture. IMPRESSION: 1. No acute intracranial abnormality. 2. Chronic left precentral gyrus infarcts. Electronically signed by: Norman Gatlin MD 12/27/2023 05:44 PM EDT RP Workstation: [id number]   DG Chest 2 View Result Date: 12/27/2023 CLINICAL DATA:  Chest pain EXAM: CHEST - 2 VIEW COMPARISON:  12/06/2023 FINDINGS: Mild cardiomegaly. Implanted cardiac pacer again seen. No significant pulmonary vascular congestion. Patchy opacity at the LEFT lung base obscuring the LEFT hemidiaphragm is most likely be due to atelectasis. IMPRESSION: 1. Mild cardiomegaly. 2. LEFT basilar opacity most likely due to atelectasis and possible minimal pleural effusion. Electronically Signed   By: Aliene Lloyd M.D.   On: 12/27/2023 15:32   CT Angio Chest Pulmonary Embolism (PE) W or WO Contrast Result Date: 12/08/2023 EXAM: CTA CHEST 12/08/2023 10:32:54 AM TECHNIQUE: CTA of the chest was performed without and with the administration of 75  mL of iohexol  (OMNIPAQUE ) 350 MG/ML injection. Multiplanar reformatted images are provided for review. MIP images are provided for review. Automated exposure control,  iterative reconstruction, and/or weight based adjustment of the mA/kV was utilized to reduce the radiation dose to as low as reasonably achievable. COMPARISON: CT of the chest dated 10/11/2022. CLINICAL HISTORY: Pt c/o mid chest pain and pressure x 2 days. He also had a Elev D-Dimer. 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 Hx Pacemaker and Cardiac stents. FINDINGS: PULMONARY ARTERIES: Pulmonary arteries are adequately opacified for evaluation. No acute pulmonary embolus. Main pulmonary artery is normal in caliber. MEDIASTINUM: The heart and pericardium demonstrate no acute abnormality. There is mild-to-moderate calcific plaque within the aortic arch. LYMPH NODES: No mediastinal, hilar or axillary lymphadenopathy. LUNGS AND PLEURA: The lungs are without acute process. No focal consolidation or pulmonary edema. No evidence of pleural effusion or pneumothorax. UPPER ABDOMEN: Limited images of the upper abdomen are unremarkable. SOFT TISSUES AND BONES: No acute bone or soft tissue abnormality. IMPRESSION: 1. No pulmonary embolism or acute pulmonary abnormality. 2. Mild-to-moderate calcific aortic arch atherosclerosis. Electronically signed by: Evalene Coho MD 12/08/2023 10:57 AM EDT RP Workstation: [id number]   DG Chest 2 View Result Date: 12/06/2023 CLINICAL DATA:  Left-sided chest pressure beginning this morning. EXAM: CHEST - 2 VIEW COMPARISON:  09/05/2023 FINDINGS: Lungs are adequately inflated and otherwise clear. Small cardiac device unchanged projecting over the left heart. Cardiomediastinal silhouette and remainder of the exam is unchanged. IMPRESSION: No active cardiopulmonary disease. Electronically Signed   By: Toribio Agreste M.D.   On: 12/06/2023 16:19    ECHO as above  TELEMETRY (personally reviewed): Normal sinus rhythm, bifascicular block rate 68 bpm  EKG (personally reviewed): NSR rate 68 bpm, no acute ischemic changes  Data reviewed by me 12/29/2023: last 24h vitals tele labs  imaging I/O ED provider note, admission H&P, hospitalist progress note, rapid response notes  Principal Problem:   Unresponsiveness Active Problems:   Cardiomyopathy, idiopathic (HCC)   Coronary artery disease   Essential hypertension   Hyperlipidemia   Iron deficiency anemia   Chronic kidney disease, stage 3a (HCC)   Overweight (BMI 25.0-29.9)   Closed rib fracture    ASSESSMENT AND PLAN:  Artin Mceuen. is a 78 y.o. 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 Cardiology was consulted for further evaluation.   # Syncope # Unresponsiveness # CAD s/p DES to LAD, LCx # S/p Micra leadless pacemaker 10/2022 # Idiopathic cardiomyopathy Patient with syncopal episode yesterday, no preceding chest pain or pressure.  Troponins normal x 2.  EKG without acute ischemic changes.  Micra pacemaker is not able to monitor for arrhythmias.  Echo this admission with a EF 40-45%, mildly decreased function, mild LVH, grade 1 diastolic dysfunction, trivial MR.  Nuclear stress yesterday low risk, no evidence of ischemia.  Rapid response called yesterday due to suspected syncopal event when standing up to work with PT, suspect that his episodes are related to orthostatic hypotension. - Continue Crestor  20 mg daily, aspirin  81 mg daily, Plavix  75 mg daily. - Continue metoprolol  succinate 12.5 mg daily, Jardiance  10 mg daily. - Will plan for Holter monitor on discharge for additional evaluation of arrhythmias.  Cardiology will sign off. Please haiku with questions  or re-engage if needed. Follow up with Dr. Florencio in 6 weeks, scheduled with [facility] heart failure in 3 weeks.   This patient's plan of care was discussed and created with Dr. Wilburn and  he is in agreement.  Signed: Danita Bloch, PA-C  12/29/2023, 11:36 AM [facility] Cardiology

## 2023-12-29 NOTE — Evaluation (Addendum)
 Occupational Therapy Evaluation Patient Details Name: Raymond Bennett. MRN: [id number] DOB: 02-15-1946 Today's Date: 12/29/2023   History of Present Illness   Pt is a 78 y.o. male presenting with unresponsiveness/LOC requiring CPR with subsequent rib fractures. CTA neg for PE, CT head neg for acute abnormality. 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     Clinical Impressions Pt admitted with above. Prior to admission, pt was very active and independent. Uses walking stick for longer distances. Pt pleasant and motivated despite rib pain. RN and rehab tech in room during session for safety due to unresponsive/syncopal episode yesterday with standing attempt. Pt puts forth good efforts, requires MIN A +2 for bed mobility to transition from supine to sitting. CGA using RW to rise from bed to a standing position, heavy UE reliance and additional support on RW for increased stability provided. Once standing, pt c/o lightheadedness with BP dropping to 67/57 MAP 62, unable to tolerate standing for BP read to be finished and returned to sitting midway. Pt assisted back to supine with MAX A +2, BP end of session 154/79 MAP 103. Unsafe to attempt functional transfers due to significant orthostatic hypotension, anticipate pt will require MAX A for bed level ADLs. Secure chat to MD with orthostatic vital signs. Edu provided to pt and spouse regarding upright positioning as tolerated. Pt would benefit from skilled OT services to address noted impairments and functional limitations (see below for any additional details) in order to maximize safety and independence while minimizing falls risk and caregiver burden. Anticipate the need for follow up OT services upon acute hospital DC.  Patient will benefit from continued inpatient follow up therapy, <3 hours/day.    Orthostatic VS for the past 24 hrs (Last 3 readings):  BP- Lying Pulse- Lying BP- Sitting Pulse- Sitting BP- Standing at 0 minutes Pulse- Standing at 0 minutes  12/29/23 1119 168/81 85 150/77 88 (!) 67/57 80        If plan is discharge home, recommend the following:   A lot of help with walking and/or transfers;A lot of help with bathing/dressing/bathroom;Assist for transportation;Help with stairs or ramp for entrance     Functional Status Assessment   Patient has had a recent decline in their functional status and demonstrates the ability to make significant improvements in function in a reasonable and predictable amount of time.     Equipment Recommendations   BSC/3in1     Recommendations for Other Services         Precautions/Restrictions   Precautions Precautions: Fall Recall of Precautions/Restrictions: Intact Precaution/Restrictions Comments: orthostatic hypotension Restrictions Weight Bearing Restrictions Per Provider Order: No     Mobility Bed Mobility Overal bed mobility: Needs Assistance Bed Mobility: Supine to Sit, Sit to Supine   Sidelying to sit: Min assist, +2 for physical assistance, +2 for safety/equipment, HOB elevated Supine to sit: Min assist, +2 for safety/equipment, +2 for physical assistance Sit to supine: Max assist, +2 for physical assistance, +2 for safety/equipment   General bed mobility comments: rib pain limits transitions from supine to sitting, MIN A +2 supine to sit with assist at trunk and HHA. MAX A +2 for trunk and BLE to return to supine and to boost up in bed, pt with increased c/o lightheadedness and requiring transition back to  bed    Transfers Overall transfer level: Needs assistance Equipment used: Rolling walker (2 wheels) Transfers: Sit to/from Stand Sit to Stand: Contact guard assist, +2 safety/equipment           General  transfer comment: able to perform STS with heavy UE use on RW/bed surface. BP dropping to 67/57 MAP 62, unable to tolerate standing for full BP read, returning to seated with BP read almost finished. could not tolerate standing for 3 mins.      Balance Overall balance assessment: Needs assistance Sitting-balance support: No upper extremity supported, Feet supported Sitting balance-Leahy Scale: Good     Standing balance support: Bilateral upper extremity supported Standing balance-Leahy Scale: Fair Standing balance comment: realiant on walker today (does not normally need) after ~45 seconds leaning heavily, feeling lightheaded and needed assist to sit                           ADL either performed or assessed with clinical judgement   ADL Overall ADL's : Needs assistance/impaired                           Toilet Transfer Details (indicate cue type and reason): unsafe to attempt Toileting- Clothing Manipulation and Hygiene: Bed level;Maximal assistance       Functional mobility during ADLs: +2 for safety/equipment;Contact guard assist;Rolling walker (2 wheels);Cueing for safety;Cueing for sequencing General ADL Comments: pt limited by bilat rib pain. anticipate need for bed level toileting. MAX A for LB ADLs with limited tolerance seated at EOB     Vision Baseline Vision/History: 1 Wears glasses Ability to See in Adequate Light: 0 Adequate Patient Visual Report: No change from baseline              Pertinent Vitals/Pain Pain Assessment Pain Assessment: Faces Faces Pain Scale: Hurts whole lot Pain Location: b/l broken rib pain from CPR Pain Descriptors / Indicators: Aching, Discomfort, Grimacing, Guarding Pain Intervention(s): Limited activity within patient's tolerance, Monitored during session, RN gave pain meds during session     Extremity/Trunk Assessment Upper Extremity Assessment Upper Extremity Assessment: Generalized weakness   Lower  Extremity Assessment Lower Extremity Assessment: Generalized weakness   Cervical / Trunk Assessment Cervical / Trunk Assessment: Normal   Communication Communication Communication: No apparent difficulties   Cognition Arousal: Alert Behavior During Therapy: WFL for tasks assessed/performed Cognition: No apparent impairments                               Following commands: Intact       Cueing  General Comments   Cueing Techniques: Verbal cues  Supine 168/81 MAP 107 HR 85  Sitting 150/77 MAP 96 HR 88  Standing 67/57 MAP 62 HR 80 (pt with increasing c/o lightheadedness & dizziness upright in standing, had to return to seated before full BP read completed in standing)  Supine end of session 154/79 MAP 1033 HR 71. RN and rehab tech in room for safety during session. Secure chat to MD with orthostatic vitals, also charted in flowsheets.           Home Living Family/patient expects to be discharged to:: Private residence Living Arrangements: Spouse/significant other Available Help at Discharge: Available 24 hours/day Type of Home: House Home Access: Stairs to enter [employer] of Steps: 1 Entrance Stairs-Rails: Right Home Layout: Multi-level;Able to live on main level  with bedroom/bathroom     Bathroom Shower/Tub: Producer, television/film/video: Standard     Home Equipment: Shower seat - built in;Grab bars - tub/shower;Cane - single point (walking stick)          Prior Functioning/Environment Prior Level of Function : Independent/Modified Independent             Mobility Comments: very active, occassionally uses hiking stick ADLs Comments: independent with spouse assisting as needed    OT Problem List: Pain;Cardiopulmonary status limiting activity;Decreased strength;Decreased range of motion;Decreased activity tolerance;Impaired balance (sitting and/or standing);Decreased knowledge of precautions;Decreased knowledge of use of DME or  AE   OT Treatment/Interventions: Self-care/ADL training;Therapeutic exercise;Energy conservation;DME and/or AE instruction;Therapeutic activities;Balance training;Patient/family education      OT Goals(Current goals can be found in the care plan section)   Acute Rehab OT Goals OT Goal Formulation: With patient/family Time For Goal Achievement: 01/12/24 Potential to Achieve Goals: Good   OT Frequency:  Min 2X/week    AM-PAC OT 6 Clicks Daily Activity     Outcome Measure Help from another person eating meals?: None Help from another person taking care of personal grooming?: A Little Help from another person toileting, which includes using toliet, bedpan, or urinal?: A Lot Help from another person bathing (including washing, rinsing, drying)?: A Lot Help from another person to put on and taking off regular upper body clothing?: A Lot Help from another person to put on and taking off regular lower body clothing?: A Lot 6 Click Score: 15   End of Session Equipment Utilized During Treatment: Rolling walker (2 wheels);Gait belt Nurse Communication: Mobility status  Activity Tolerance: Treatment limited secondary to medical complications (Comment) (orthostatic hypotension and symptomatic) Patient left: in bed;with call bell/phone within reach;with bed alarm set;with family/visitor present  OT Visit Diagnosis: Pain;Other abnormalities of gait and mobility (R26.89);Unsteadiness on feet (R26.81);Muscle weakness (generalized) (M62.81) Pain - Right/Left:  (bilateral) Pain - part of body:  (rib pain)                Time: 1046-1130 OT Time Calculation (min): 44 min Charges:  OT General Charges $OT Visit: 1 Visit OT Evaluation $OT Eval Low Complexity: 1 Low OT Treatments $Self Care/Home Management : 23-37 mins  Clarke Peretz L. Essynce Munsch, OTR/L  12/29/23, 11:51 AM

## 2023-12-29 NOTE — Progress Notes (Signed)
 Physical Therapy Treatment Patient Details Name: Raymond Bennett. MRN: [id number] DOB: December 25, 1945 Today's Date: 12/29/2023   History of Present Illness Pt is a 78 y.o. male presenting with unresponsiveness/LOC requiring CPR with subsequent rib fractures. CTA neg for PE, CT head neg for acute abnormality. 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    PT Comments  Pt pleasant and willing to do some activity with PT, but continues to struggle with severe lightheadedness with minimal time in standing.  Did some activity prior to transitions to facilitate some blood flow/BP rise but he had orthostatic S/S.  BP dropped 167/81 to 109/79 supine to sit with minimal dizziness, after a few minutes of sitting we stood with difficult BP reading but severe lightheadedness with ~30 seconds of standing and we needed to abort further upright activity.  Pt will benefit from continued PT when medically able to more fully participate.    If plan is discharge home, recommend the following: A little help with walking and/or transfers;A little help with bathing/dressing/bathroom;Assist for transportation;Assistance with cooking/housework;Help with stairs or ramp for entrance   Can travel by private vehicle     No  Equipment Recommendations  Rolling walker (2 wheels)    Recommendations for Other Services       Precautions / Restrictions Precautions Precautions: Fall Recall of Precautions/Restrictions: Intact Precaution/Restrictions Comments: orthostatic hypotension Restrictions Weight Bearing Restrictions Per Provider Order: No     Mobility  Bed Mobility Overal bed mobility: Needs Assistance Bed Mobility: Supine to Sit, Sit to Supine   Sidelying to sit: Min assist, HOB  elevated, Mod assist Supine to sit: Min assist, Mod assist Sit to supine: Min assist, Mod assist   General bed mobility comments: showed good effort but lightheadedness and rib  pain were limiters    Transfers Overall transfer level: Needs assistance Equipment used: Rolling walker (2 wheels) Transfers: Sit to/from Stand Sit to Stand: Contact guard assist           General transfer comment: Pt again with symptomatic response to standing, lightheaded and needed to sit.  No nausea or blank staring today, but unable to maintain <30 seconds before needing to sit    Ambulation/Gait               General Gait Details: deferred, unsafe   Stairs             Wheelchair Mobility     Tilt Bed    Modified Rankin (Stroke Patients Only)       Balance Overall balance assessment: Needs assistance Sitting-balance support: No upper extremity supported, Feet supported Sitting balance-Leahy Scale: Good     Standing balance support: Bilateral upper extremity supported Standing balance-Leahy Scale: Fair Standing balance comment: realiant on walker today (does not normally need) after 30-40 seconds leaning heavily, feeling lightheaded and needed assist to sit                            Communication Communication Communication: No apparent difficulties  Cognition Arousal: Alert Behavior During Therapy: [facility] for tasks assessed/performed                             Following commands: Intact  Cueing Cueing Techniques: Verbal cues  Exercises      General Comments General comments (skin integrity, edema, etc.): supine: 167/81, sitting 109/79 (minimal S/S), standing 121/102 (pt using UE on walker heavily, likely incorrect reading) - severely lightheaded after 30 seconds standing      Pertinent Vitals/Pain Pain Assessment Pain Assessment: 0-10 Pain Score: 9  Pain Location: b/l broken rib pain from CPR Pain Intervention(s): Premedicated  before session    Home Living                          Prior Function            PT Goals (current goals can now be found in the care plan section) Progress towards PT goals: Progressing toward goals    Frequency    Min 2X/week      PT Plan      Co-evaluation              AM-PAC PT 6 Clicks Mobility   Outcome Measure  Help needed turning from your back to your side while in a flat bed without using bedrails?: A Little Help needed moving from lying on your back to sitting on the side of a flat bed without using bedrails?: A Little Help needed moving to and from a bed to a chair (including a wheelchair)?: A Little Help needed standing up from a chair using your arms (e.g., wheelchair or bedside chair)?: A Little Help needed to walk in hospital room?: Total Help needed climbing 3-5 steps with a railing? : Total 6 Click Score: 14    End of Session Equipment Utilized During Treatment: Gait belt Activity Tolerance: Other (comment) (orthostatic) Patient left: with bed alarm set;with call bell/phone within reach;with family/visitor present Nurse Communication: Mobility status PT Visit Diagnosis: Muscle weakness (generalized) (M62.81);Difficulty in walking, not elsewhere classified (R26.2)     Time: 1353-1410 PT Time Calculation (min) (ACUTE ONLY): 17 min  Charges:    $Therapeutic Activity: 8-22 mins PT General Charges $$ ACUTE PT VISIT: 1 Visit                     Carmin JONELLE Deed, DPT 12/29/2023, 5:10 PM

## 2023-12-29 NOTE — Progress Notes (Signed)
 Heart Failure Navigator Progress Note  Assessed for Heart & Vascular TOC clinic readiness.  Patient does not meet criteria due to current [facility] Cardiology patient.   Navigator will sign off at this time.  Roxy Horseman, RN, BSN [facility] Heart Failure Navigator Secure Chat Only

## 2023-12-30 DIAGNOSIS — S2243XS Multiple fractures of ribs, bilateral, sequela: Secondary | ICD-10-CM | POA: Diagnosis not present

## 2023-12-30 DIAGNOSIS — I2510 Atherosclerotic heart disease of native coronary artery without angina pectoris: Secondary | ICD-10-CM

## 2023-12-30 DIAGNOSIS — I10 Essential (primary) hypertension: Secondary | ICD-10-CM

## 2023-12-30 DIAGNOSIS — I951 Orthostatic hypotension: Secondary | ICD-10-CM | POA: Diagnosis not present

## 2023-12-30 DIAGNOSIS — N179 Acute kidney failure, unspecified: Secondary | ICD-10-CM | POA: Diagnosis not present

## 2023-12-30 DIAGNOSIS — R4189 Other symptoms and signs involving cognitive functions and awareness: Secondary | ICD-10-CM | POA: Diagnosis not present

## 2023-12-30 LAB — CBC
HCT: 32.8 % — ABNORMAL LOW (ref 39.0–52.0)
Hemoglobin: 10.8 g/dL — ABNORMAL LOW (ref 13.0–17.0)
MCH: 29.3 pg (ref 26.0–34.0)
MCHC: 32.9 g/dL (ref 30.0–36.0)
MCV: 88.9 fL (ref 80.0–100.0)
Platelets: 249 K/uL (ref 150–400)
RBC: 3.69 MIL/uL — ABNORMAL LOW (ref 4.22–5.81)
RDW: 14.7 % (ref 11.5–15.5)
WBC: 11 K/uL — ABNORMAL HIGH (ref 4.0–10.5)
nRBC: 0 % (ref 0.0–0.2)

## 2023-12-30 LAB — BASIC METABOLIC PANEL WITH GFR
Anion gap: 8 (ref 5–15)
BUN: 28 mg/dL — ABNORMAL HIGH (ref 8–23)
CO2: 25 mmol/L (ref 22–32)
Calcium: 8.6 mg/dL — ABNORMAL LOW (ref 8.9–10.3)
Chloride: 104 mmol/L (ref 98–111)
Creatinine, Ser: 1.26 mg/dL — ABNORMAL HIGH (ref 0.61–1.24)
GFR, Estimated: 58 mL/min — ABNORMAL LOW (ref >60)
Glucose, Bld: 94 mg/dL (ref 70–99)
Potassium: 3.8 mmol/L (ref 3.5–5.1)
Sodium: 137 mmol/L (ref 135–145)

## 2023-12-30 LAB — CORTISOL-AM, BLOOD: Cortisol - AM: 20.9 ug/dL (ref 6.7–22.6)

## 2023-12-30 MED ORDER — SODIUM CHLORIDE 0.9 % IV SOLN: INTRAVENOUS | Status: DC

## 2023-12-30 MED ORDER — MIDODRINE HCL 5 MG PO TABS
10.0000 mg | ORAL_TABLET | Freq: Three times a day (TID) | ORAL | Status: DC
  Administered 2023-12-30 – 2024-01-01 (×5): 10 mg via ORAL
  Filled 2023-12-30 (×6): qty 2

## 2023-12-30 MED ORDER — POLYETHYLENE GLYCOL 3350 17 G PO PACK
17.0000 g | PACK | Freq: Every day | ORAL | Status: DC
  Administered 2023-12-30 – 2023-12-31 (×2): 17 g via ORAL
  Filled 2023-12-30 (×2): qty 1

## 2023-12-30 MED ORDER — MIDODRINE HCL 5 MG PO TABS
5.0000 mg | ORAL_TABLET | Freq: Once | ORAL | Status: AC
  Administered 2023-12-30: 5 mg via ORAL
  Filled 2023-12-30: qty 1

## 2023-12-30 NOTE — Plan of Care (Signed)
  Problem: Education: Goal: Knowledge of General Education information will improve Description: Including pain rating scale, medication(s)/side effects and non-pharmacologic comfort measures Outcome: Progressing   Problem: Health Behavior/Discharge Planning: Goal: Ability to manage health-related needs will improve Outcome: Progressing   Problem: Clinical Measurements: Goal: Ability to maintain clinical measurements within normal limits will improve Outcome: Progressing Goal: Will remain [name] from infection Outcome: Progressing Goal: Diagnostic test results will improve Outcome: Progressing Goal: Respiratory complications will improve Outcome: Progressing Goal: Cardiovascular complication will be avoided Outcome: Progressing   Problem: Activity: Goal: Risk for activity intolerance will decrease Outcome: Progressing   Problem: Coping: Goal: Level of anxiety will decrease Outcome: Progressing   Problem: Elimination: Goal: Will not experience complications related to bowel motility Outcome: Progressing Goal: Will not experience complications related to urinary retention Outcome: Progressing   Problem: Pain Managment: Goal: General experience of comfort will improve and/or be controlled Outcome: Progressing   Problem: Safety: Goal: Ability to remain [name] from injury will improve Outcome: Progressing   Problem: Skin Integrity: Goal: Risk for impaired skin integrity will decrease Outcome: Progressing   Problem: Nutrition: Goal: Adequate nutrition will be maintained Outcome: Not Progressing Note: RN encouraging pt to eat and drink as able. Pt has loss of appetite.

## 2023-12-30 NOTE — NC FL2 (Signed)
 [place]  MEDICAID FL2 LEVEL OF CARE FORM     IDENTIFICATION  Patient Name: Raymond Bennett. Birthdate: [birth year] Sex: male Admission Date (Current Location): 12/27/2023  [place] and [place] Number:  Chiropodist and Address:  [facility], [address] [place], [place] [address]      Provider Number: [id number]  Attending Physician Name and Address:  Josette Ade, MD  Relative Name and Phone Number:  Petro, Talent (Spouse)  [phone]    Current Level of Care: Hospital Recommended Level of Care: Skilled Nursing Facility Prior Approval Number:    Date Approved/Denied:   PASRR Number: [id number]  Discharge Plan: SNF    Current Diagnoses: Patient Active Problem List   Diagnosis Date Noted   Orthostatic hypotension 12/29/2023   Unresponsiveness 12/27/2023   Iron deficiency anemia 12/27/2023   Overweight (BMI 25.0-29.9) 12/27/2023   Closed rib fracture 12/27/2023   Iron deficiency 09/21/2023   Pacemaker 09/08/2023   Ischemic cardiomyopathy 09/08/2023   Renal cell carcinoma (HCC) 09/08/2023   SSS (sick sinus syndrome) (HCC) 09/08/2023   Unstable angina (HCC) 09/05/2023   Anemia in CKD (chronic kidney disease) 09/05/2023   Essential hypertension 09/05/2023   Acute kidney injury superimposed on CKD 09/05/2023   Coronary artery disease    GERD (gastroesophageal reflux disease)    Hyperlipidemia 08/24/2023   S/P coronary artery stent placement 08/02/2023   First degree AV block 09/20/2019   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 Yes; Fluid consistency: Thin: Cardiac starting at 10/22 1402)  AMBULATORY STATUS COMMUNICATION OF NEEDS Skin   Limited Assist Verbally Normal                       Personal Care Assistance Level of Assistance  Bathing, Feeding, Dressing Bathing Assistance: Limited assistance Feeding assistance: Independent Dressing Assistance: Limited assistance     Functional Limitations Info  Sight, Hearing, Speech          SPECIAL CARE FACTORS FREQUENCY  PT (By licensed PT), OT (By licensed OT)     PT Frequency: 2x OT Frequency: 2x            Contractures Contractures Info: Not present    Additional Factors Info  Code Status, Allergies Code Status Info: FULL Allergies Info: Bovine products           Current Medications (12/30/2023):  This is the current hospital active medication list Current Facility-Administered Medications  Medication Dose Route Frequency Provider Last Rate Last Admin   0.9 %  sodium chloride  infusion   Intravenous Continuous Wieting, Richard, MD       acetaminophen  (TYLENOL ) tablet 650 mg  650 mg Oral Q6H PRN Niu, Xilin, MD   650 mg at 12/28/23 0505   aspirin  EC tablet 81 mg  81 mg Oral Daily Niu, Xilin, MD   81 mg at 12/30/23 9096   clopidogrel  (PLAVIX ) tablet 75 mg  75 mg Oral Daily Niu, Xilin, MD  75 mg at 12/30/23 9096   cyanocobalamin  (VITAMIN B12) tablet 1,000 mcg  1,000 mcg Oral q AM Niu, Xilin, MD   1,000 mcg at 12/30/23 9096   empagliflozin  (JARDIANCE ) tablet 10 mg  10 mg Oral Daily Niu, Xilin, MD   10 mg at 12/30/23 9095   feeding supplement (ENSURE PLUS HIGH PROTEIN) liquid 237 mL  237 mL Oral BID BM Josette Ade, MD   237 mL at 12/30/23 1607   ferrous sulfate  tablet 325 mg  325 mg Oral q AM Niu, Xilin, MD   325 mg at 12/30/23 9096   finasteride  (PROSCAR ) tablet 5 mg  5 mg Oral Daily Niu, Xilin, MD   5 mg at 12/30/23 0903    lidocaine  (LIDODERM ) 5 % 1 patch  1 patch Transdermal Q24H Niu, Xilin, MD   1 patch at 12/29/23 2219   magnesium  oxide (MAG-OX) tablet 400 mg  400 mg Oral Daily Niu, Xilin, MD   400 mg at 12/30/23 9096   midodrine  (PROAMATINE ) tablet 10 mg  10 mg Oral TID WC Josette Ade, MD   10 mg at 12/30/23 1607   morphine  (PF) 2 MG/ML injection 2 mg  2 mg Intravenous Q4H PRN Niu, Xilin, MD   2 mg at 12/30/23 9176   multivitamin (PROSIGHT) tablet 1 tablet  1 tablet Oral BID Niu, Xilin, MD   1 tablet at 12/30/23 9096   nitroGLYCERIN  (NITROSTAT ) SL tablet 0.4 mg  0.4 mg Sublingual Q5 Min x 3 PRN Niu, Xilin, MD       ondansetron  (ZOFRAN ) injection 4 mg  4 mg Intravenous Q8H PRN Niu, Xilin, MD       oxyCODONE -acetaminophen  (PERCOCET/ROXICET) 5-325 MG per tablet 1 tablet  1 tablet Oral Q4H PRN Niu, Xilin, MD   1 tablet at 12/29/23 1110   pantoprazole  (PROTONIX ) EC tablet 40 mg  40 mg Oral Daily Niu, Xilin, MD   40 mg at 12/30/23 0904   polyethylene glycol (MIRALAX  / GLYCOLAX ) packet 17 g  17 g Oral Daily Josette Ade, MD   17 g at 12/30/23 1148   rosuvastatin  (CRESTOR ) tablet 20 mg  20 mg Oral Daily Niu, Xilin, MD   20 mg at 12/30/23 9095     Discharge Medications: Please see discharge summary for a list of discharge medications.  Relevant Imaging Results:  Relevant Lab Results:   Additional Information [id number]  [place] L Luis Nickles, LCSW

## 2023-12-30 NOTE — Plan of Care (Signed)
   Problem: Education: Goal: Knowledge of General Education information will improve Description Including pain rating scale, medication(s)/side effects and non-pharmacologic comfort measures Outcome: Progressing

## 2023-12-30 NOTE — TOC Initial Note (Signed)
 Transition of Care (TOC) - Initial/Assessment Note    Patient Details  Name: Raymond Bennett. MRN: [id number] Date of Birth: 1946/01/27  Transition of Care [facility]) CM/SW Contact:    [place] L Ulices Maack, LCSW Phone Number: 12/30/2023, 5:33 PM  Clinical Narrative:                    CSW met with patient. Spouse was at bedside. CSW explained role. Recommendations were reviewed. Family hesitant on going to SNF. CSW explained SNF vs Home Health.   Family agreed to start the process for SNF placement and when bed offers are reviewed, they will make a choice. Spouse stated that neighbors and friends have offered to assist. Patient and spouse are very uncomfortable with the idea of patient not discharging home.   FL2 will be completed.      Patient Goals and CMS Choice            Expected Discharge Plan and Services                                              Prior Living Arrangements/Services                       Activities of Daily Living   ADL Screening (condition at time of admission) Independently performs ADLs?: No Does the patient have a NEW difficulty with bathing/dressing/toileting/self-feeding that is expected to last >3 days?: Yes (Initiates electronic notice to provider for possible OT consult) Does the patient have a NEW difficulty with getting in/out of bed, walking, or climbing stairs that is expected to last >3 days?: Yes (Initiates electronic notice to provider for possible PT consult) Does the patient have a NEW difficulty with communication that is expected to last >3 days?: No Is the patient deaf or have difficulty hearing?: No Does the patient have difficulty seeing, even when wearing glasses/contacts?: Yes Does the patient have difficulty concentrating, remembering, or making decisions?: Yes  Permission Sought/Granted                  Emotional Assessment              Admission diagnosis:  Loss of consciousness (HCC)  [R40.20] Syncope [R55] Unresponsiveness [R41.89] Closed fracture of multiple ribs of both sides, initial encounter [S22.43XA] Patient Active Problem List   Diagnosis Date Noted   Orthostatic hypotension 12/29/2023   Unresponsiveness 12/27/2023   Iron deficiency anemia 12/27/2023   Overweight (BMI 25.0-29.9) 12/27/2023   Closed rib fracture 12/27/2023   Iron deficiency 09/21/2023   Pacemaker 09/08/2023   Ischemic cardiomyopathy 09/08/2023   Renal cell carcinoma (HCC) 09/08/2023   SSS (sick sinus syndrome) (HCC) 09/08/2023   Unstable angina (HCC) 09/05/2023   Anemia in CKD (chronic kidney disease) 09/05/2023   Essential hypertension 09/05/2023   Acute kidney injury superimposed on CKD 09/05/2023   Coronary artery disease    GERD (gastroesophageal reflux disease)    Hyperlipidemia 08/24/2023   S/P coronary artery stent placement 08/02/2023   First degree AV block 09/20/2019   Atherosclerosis of abdominal aorta 03/15/2019   Cardiomyopathy, idiopathic (HCC) 04/12/2018   CKD (chronic kidney disease) stage 3, GFR 30-59 ml/min (HCC) 03/09/2018   Second degree AV block, Mobitz type I 03/09/2018   History of gastric polyp 07/26/2016   PCP:  Everlene Parris LABOR, MD Pharmacy:  [facility] [place], [place] - [address] [place] [address] Phone: [phone] Fax: [phone]     Social Drivers of Health (SDOH) Social History: SDOH Screenings   Food Insecurity: No Food Insecurity (12/28/2023)  Housing: Low Risk  (12/28/2023)  Transportation Needs: No Transportation Needs (12/28/2023)  Utilities: Not At Risk (12/28/2023)  Depression (PHQ2-9): Low Risk  (07/06/2023)  Financial Resource Strain: Low Risk  (09/12/2023)  Physical Activity: Sufficiently Active (09/12/2023)  Social Connections: Socially Integrated (12/28/2023)  Stress: No Stress Concern Present (09/12/2023)  Tobacco Use: Low Risk  (12/27/2023)   SDOH Interventions:     Readmission Risk  Interventions     No data to display

## 2023-12-30 NOTE — Progress Notes (Signed)
 Occupational Therapy Treatment Patient Details Name: Raymond Bennett. MRN: [id number] DOB: 10-11-1945 Today's Date: 12/30/2023   History of present illness Pt is a 78 y.o. male presenting with unresponsiveness/LOC requiring CPR with subsequent rib fractures. CTA neg for PE, CT head neg for acute abnormality. 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    OT comments  Pt seen for OT treatment this date, RN present throughout session for orthostatic vital assessment. Pt received pain meds and midodrine  prior to session; bilateral TED hose donned throughout. Pt reports improved comfort during bed mobility using a pillow to support around abdomen and is able to transition with increased time and MIN-MOD A. Instructed pt in seated marches / ankle pumps. Standing tolerance limited to ~45 seconds before pt becomes symptomatic, endorsing lightheadedness/dizziness with BP dropping to 72/55. Pt performs 2x STS trials using RW, does rely heavily on UE support due to bilat rib pain, able to take a few lateral sidesteps towards [facility] this date. OT will continue to follow and progress as able, will trial ACE wraps to BLE in hopes of BP stabilization next session.    Orthostatic VS for the past 24 hrs (Last 3 readings):  BP- Lying Pulse- Lying BP- Sitting Pulse- Sitting BP- Standing at 0 minutes Pulse- Standing at 0 minutes  12/30/23 1140 136/59 88 115/51 87 (!) 72/55 85         If plan is discharge home, recommend the following:  A lot of help with walking and/or transfers;A lot of help with bathing/dressing/bathroom;Assist for transportation;Help with stairs or ramp for entrance   Equipment Recommendations  BSC/3in1    Recommendations for Other Services      Precautions /  Restrictions Precautions Precautions: Fall Recall of Precautions/Restrictions: Intact Precaution/Restrictions Comments: orthostatic hypotension Restrictions Weight Bearing Restrictions Per Provider Order: No       Mobility Bed Mobility Overal bed mobility: Needs Assistance Bed Mobility: Supine to Sit, Sit to Supine     Supine to sit: Min assist, Mod assist Sit to supine: Min assist, Mod assist   General bed mobility comments: with increased timed, pt able to transition towards sitting EOB. limited by rib pain. showed good effors    Transfers Overall transfer level: Needs assistance Equipment used: Rolling walker (2 wheels) Transfers: Sit to/from Stand             General transfer comment: Pt performs 1x STS, tolerated ~45 seconds with BP dropping and pt (+) for orthostatic hypotension. pt able to stand a second time after seated recovery break and take several sidesteps. unable to tolerate standing for 3 min read. pt needed to return to supine after 2nd standing trial with increased lightheadedness     Balance Overall balance assessment: Needs assistance Sitting-balance support: No upper extremity supported, Feet supported Sitting balance-Leahy Scale: Good     Standing balance support: Bilateral upper extremity supported Standing balance-Leahy Scale: Fair Standing balance comment: reliant on RW, tolerates 45 sec before symptoms increase and needs to sit                           ADL either performed or assessed with clinical judgement   ADL Overall ADL's : Needs assistance/impaired  Functional mobility during ADLs: +2 for safety/equipment;Contact guard assist;Rolling walker (2 wheels);Cueing for safety;Cueing for sequencing General ADL Comments: pt performs 2x STS with CGA using RW, relies heavily on BUE support, able to take a few lateral sidesteps but limited by orthostatic hypotension and symptomatic      Communication Communication Communication: No apparent difficulties   Cognition Arousal: Alert Behavior During Therapy: WFL for tasks assessed/performed Cognition: No apparent impairments                               Following commands: Intact        Cueing   Cueing Techniques: Verbal cues        General Comments Pt recieved pain meds and midodrine  prior to session. BP assessed with RN in room throughout session. Updated MD via secure chat.    Pertinent Vitals/ Pain       Pain Assessment Pain Assessment: Faces Faces Pain Scale: Hurts whole lot Pain Location: b/l broken rib pain from CPR Pain Descriptors / Indicators: Aching, Discomfort, Grimacing, Guarding Pain Intervention(s): Limited activity within patient's tolerance, Monitored during session, Premedicated before session   Frequency  Min 2X/week        Progress Toward Goals  OT Goals(current goals can now be found in the care plan section)  Progress towards OT goals: Progressing toward goals  Acute Rehab OT Goals OT Goal Formulation: With patient/family Time For Goal Achievement: 01/12/24 Potential to Achieve Goals: Good ADL Goals Pt Will Perform Grooming: Independently;sitting;standing Pt Will Perform Lower Body Dressing: with modified independence;sitting/lateral leans;sit to/from stand Pt Will Transfer to Toilet: with modified independence;ambulating;regular height toilet;grab bars Pt Will Perform Toileting - Clothing Manipulation and hygiene: with modified independence;sitting/lateral leans;sit to/from stand  Plan      Co-evaluation                 AM-PAC OT 6 Clicks Daily Activity     Outcome Measure   Help from another person eating meals?: None Help from another person taking care of personal grooming?: A Little Help from another person toileting, which includes using toliet, bedpan, or urinal?: A Lot Help from another person bathing (including washing, rinsing,  drying)?: A Lot Help from another person to put on and taking off regular upper body clothing?: A Lot Help from another person to put on and taking off regular lower body clothing?: A Lot 6 Click Score: 15    End of Session Equipment Utilized During Treatment: Rolling walker (2 wheels)  OT Visit Diagnosis: Pain;Other abnormalities of gait and mobility (R26.89);Unsteadiness on feet (R26.81);Muscle weakness (generalized) (M62.81) Pain - Right/Left: Right   Activity Tolerance Treatment limited secondary to medical complications (Comment) (orthostatic hypotension and symptomatic in standing)   Patient Left in bed;with call bell/phone within reach;with bed alarm set;with family/visitor present;with nursing/sitter in room   Nurse Communication Mobility status        Time: 1110-1148 OT Time Calculation (min): 38 min  Charges: OT General Charges $OT Visit: 1 Visit OT Treatments $Therapeutic Activity: 38-52 mins  Olivene Cookston L. Davonna Ertl, OTR/L  12/30/23, 2:27 PM

## 2023-12-30 NOTE — Progress Notes (Signed)
 Progress Note   Patient: Raymond Bennett. FMW:[id number] DOB: Dec 03, 1945 DOA: 12/27/2023     1 DOS: the patient was seen and examined on 12/30/2023   Brief hospital course:  78 y.o. 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   Per pt and his wife at bedside, pt was speaking with a lawn care provider in his yard around 2:00 PM, while talking with him started feeling suddenly unwell. Pt was assisted to the ground by a group of landscapers where pt then became unresponsive. It lasted for about 1-2 minutes.  No seizure activity. They did not feel a pulse so initiated CPR.  Patient spontaneously returned to consciousness during this.  Patient states that after the event, when he tried to stand up, he fell dizzy and lightheaded.  No unilateral numbness or tingling in extremities.  No facial droop or slurred speech.  He states that he did not have chest pain before the event, but complains of frontal chest wall pain after CPR. Patient does not have nausea, vomiting, diarrhea or abdominal pain.  No symptoms of UTI.  No feeling of pacemaker flareup.  No fever or chills.     Data reviewed independently and ED Course: pt was found to have trop 9 --> 8, WBC 9.1, stable renal function, BNP 58.4, INR 1.1.  Temperature normal, blood pressure 104/59, 137/71, heart rate 65-93, RR 15, oxygen saturation 100% on room air.  Patient is placed in the PCU for observation.   CT of head: 1. No acute intracranial abnormality. 2. Chronic left precentral gyrus infarcts.   CTA: 1. Negative for acute pulmonary embolus. 2. Cardiomegaly. 3. Multiple bilateral anterior rib fractures. No pneumothorax.  Acute right second,  third, fourth, fifth, 6, seventh anterior rib fractures. Acute left second, fourth, fifth, sixth anterior rib fractures. 4. Aortic atherosclerosis.   Chest x-ray: 1. Mild cardiomegaly. 2. LEFT basilar opacity most likely due to atelectasis and possible minimal pleural effusion.     EKG: I have personally reviewed.  Sinus rhythm, QTc 476, bifascicular block, poor R wave progression.   10/22.  Patient felt okay this morning.  Echocardiogram and stress test ordered by cardiology.  EF 40 to 45%.  Stress test low risk scan.  When working with physical therapy patient became lightheaded and nauseous.  A rapid response was called.  Patient could not stand long enough to check her blood pressure while standing.  Blood pressure high while lying back down. 10/23.  Patient orthostatic.  Started on midodrine  5 mg 3 times daily.  Continue IV fluids.  TED hose. 10/24.  Patient still orthostatic.  Increase midodrine  10 mg 3 times daily.  Continue IV fluids.  A.m. cortisol normal range.  Assessment and Plan: * Orthostatic hypotension With blood pressure dropping down into the 70s with standing.  Increasing midodrine  to 10 mg 3 times daily continue IV fluid hydration.  Hold Toprol .  TED hose.  Patient states that even at home when he is doing yard work sometimes he has to lie down in the grass secondary to feeling lightheaded.  A.m. cortisol normal range.  Unresponsiveness Secondary to orthostatic hypotension.  Closed rib fracture Numerous rib fractures bilaterally secondary to CPR as outpatient.  Acute kidney injury superimposed on  CKD AKI on CKD stage III A.  Creatinine improved from 1.48 down to 1.15 with IV fluids.  Today's creatinine 1.26.  Continue IV fluid hydration.  Patient must eat.  Cardiomyopathy, idiopathic (HCC) EF slightly better than previous at 40 to 45%  Essential hypertension Hold Toprol   Hyperlipidemia On Crestor   Iron deficiency anemia Hemoglobin 10.8  Overweight (BMI  25.0-29.9) BMI 27.79        Subjective: Patient seen sitting up today.  He has not been eating very well.  Still having pain in the ribs from CPR.  Patient found to be severely orthostatic yesterday.  Admitted with unresponsive episode requiring CPR.  Believe this is secondary to orthostatic hypotension.  Physical Exam: Vitals:   12/29/23 1503 12/29/23 2011 12/30/23 0435 12/30/23 0500  BP: (!) 147/77 (!) 155/75 (!) 155/88   Pulse: 85 80 94   Resp: 17 17 18    Temp: 98.7 F (37.1 C) 98.4 F (36.9 C) 98.5 F (36.9 C)   TempSrc:      SpO2: 100% 95% 93%   Weight:    82.9 kg  Height:       Physical Exam HENT:     Head: Normocephalic.     Mouth/Throat:     Pharynx: No oropharyngeal exudate.  Eyes:     General: Lids are normal.     Conjunctiva/sclera: Conjunctivae normal.  Cardiovascular:     Rate and Rhythm: Normal rate and regular rhythm.     Heart sounds: Normal heart sounds, S1 normal and S2 normal.  Pulmonary:     Breath sounds: Examination of the right-lower field reveals decreased breath sounds. Examination of the left-lower field reveals decreased breath sounds. Decreased breath sounds present. No wheezing, rhonchi or rales.  Abdominal:     Palpations: Abdomen is soft.     Tenderness: There is no abdominal tenderness.  Musculoskeletal:     Right lower leg: No swelling.     Left lower leg: No swelling.  Skin:    General: Skin is warm.     Findings: No rash.  Neurological:     Mental Status: He is alert and oriented to person, place, and time.     Data Reviewed: A.m. cortisol 20.9, white blood cell count 11.0, hemoglobin 10.8, creatinine 1.26, electrolytes normal range.  Family Communication: Wife at bedside  Disposition: Status is: Inpatient Remains inpatient appropriate because: Patient again orthostatic today increase midodrine  10 mg 3 times daily.  May end up needing Florinef.  A.m. cortisol normal.  Planned Discharge Destination: Rehab    Time spent:  28 minutes  Author: Charlie Patterson, MD 12/30/2023 3:07 PM  For on call review [email or web address].

## 2023-12-31 ENCOUNTER — Inpatient Hospital Stay

## 2023-12-31 DIAGNOSIS — S2243XS Multiple fractures of ribs, bilateral, sequela: Secondary | ICD-10-CM | POA: Diagnosis not present

## 2023-12-31 DIAGNOSIS — I951 Orthostatic hypotension: Secondary | ICD-10-CM | POA: Diagnosis not present

## 2023-12-31 DIAGNOSIS — N179 Acute kidney failure, unspecified: Secondary | ICD-10-CM | POA: Diagnosis not present

## 2023-12-31 DIAGNOSIS — M25511 Pain in right shoulder: Secondary | ICD-10-CM | POA: Insufficient documentation

## 2023-12-31 DIAGNOSIS — R4189 Other symptoms and signs involving cognitive functions and awareness: Secondary | ICD-10-CM | POA: Diagnosis not present

## 2023-12-31 LAB — BASIC METABOLIC PANEL WITH GFR
Anion gap: 12 (ref 5–15)
BUN: 34 mg/dL — ABNORMAL HIGH (ref 8–23)
CO2: 21 mmol/L — ABNORMAL LOW (ref 22–32)
Calcium: 8.5 mg/dL — ABNORMAL LOW (ref 8.9–10.3)
Chloride: 103 mmol/L (ref 98–111)
Creatinine, Ser: 1.21 mg/dL (ref 0.61–1.24)
GFR, Estimated: >60 mL/min (ref >60)
Glucose, Bld: 105 mg/dL — ABNORMAL HIGH (ref 70–99)
Potassium: 3.7 mmol/L (ref 3.5–5.1)
Sodium: 136 mmol/L (ref 135–145)

## 2023-12-31 LAB — HEMOGLOBIN: Hemoglobin: 10.3 g/dL — ABNORMAL LOW (ref 13.0–17.0)

## 2023-12-31 MED ORDER — LACTULOSE 10 GM/15ML PO SOLN: 30.0000 g | Freq: Every day | ORAL | Status: DC | PRN

## 2023-12-31 MED ORDER — FLUDROCORTISONE ACETATE 0.1 MG PO TABS
0.1000 mg | ORAL_TABLET | Freq: Every day | ORAL | Status: DC
  Administered 2023-12-31: 0.1 mg via ORAL
  Filled 2023-12-31 (×2): qty 1

## 2023-12-31 MED ORDER — ORAL CARE MOUTH RINSE: 15.0000 mL | OROMUCOSAL | Status: DC | PRN

## 2023-12-31 MED ORDER — POLYETHYLENE GLYCOL 3350 17 G PO PACK
17.0000 g | PACK | Freq: Two times a day (BID) | ORAL | Status: DC
  Administered 2023-12-31 – 2024-01-02 (×5): 17 g via ORAL
  Filled 2023-12-31 (×5): qty 1

## 2023-12-31 NOTE — Plan of Care (Signed)
  Problem: Education: Goal: Knowledge of General Education information will improve Description: Including pain rating scale, medication(s)/side effects and non-pharmacologic comfort measures Outcome: Progressing   Problem: Activity: Goal: Risk for activity intolerance will decrease Outcome: Progressing   Problem: Pain Managment: Goal: General experience of comfort will improve and/or be controlled Outcome: Progressing

## 2023-12-31 NOTE — Assessment & Plan Note (Signed)
 Patient not moving his right arm very well since getting CPR.  X-ray of the right shoulder negative.  Able to go through passive range of motion with his right shoulder.

## 2023-12-31 NOTE — Progress Notes (Signed)
 Progress Note   Patient: Raymond Bennett. FMW:[id number] DOB: Dec 18, 1945 DOA: 12/27/2023     2 DOS: the patient was seen and examined on 12/31/2023   Brief hospital course:  78 y.o. 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   Per pt and his wife at bedside, pt was speaking with a lawn care provider in his yard around 2:00 PM, while talking with him started feeling suddenly unwell. Pt was assisted to the ground by a group of landscapers where pt then became unresponsive. It lasted for about 1-2 minutes.  No seizure activity. They did not feel a pulse so initiated CPR.  Patient spontaneously returned to consciousness during this.  Patient states that after the event, when he tried to stand up, he fell dizzy and lightheaded.  No unilateral numbness or tingling in extremities.  No facial droop or slurred speech.  He states that he did not have chest pain before the event, but complains of frontal chest wall pain after CPR. Patient does not have nausea, vomiting, diarrhea or abdominal pain.  No symptoms of UTI.  No feeling of pacemaker flareup.  No fever or chills.     Data reviewed independently and ED Course: pt was found to have trop 9 --> 8, WBC 9.1, stable renal function, BNP 58.4, INR 1.1.  Temperature normal, blood pressure 104/59, 137/71, heart rate 65-93, RR 15, oxygen saturation 100% on room air.  Patient is placed in the PCU for observation.   CT of head: 1. No acute intracranial abnormality. 2. Chronic left precentral gyrus infarcts.   CTA: 1. Negative for acute pulmonary embolus. 2. Cardiomegaly. 3. Multiple bilateral anterior rib fractures. No pneumothorax.  Acute right second,  third, fourth, fifth, 6, seventh anterior rib fractures. Acute left second, fourth, fifth, sixth anterior rib fractures. 4. Aortic atherosclerosis.   Chest x-ray: 1. Mild cardiomegaly. 2. LEFT basilar opacity most likely due to atelectasis and possible minimal pleural effusion.     EKG: I have personally reviewed.  Sinus rhythm, QTc 476, bifascicular block, poor R wave progression.   10/22.  Patient felt okay this morning.  Echocardiogram and stress test ordered by cardiology.  EF 40 to 45%.  Stress test low risk scan.  When working with physical therapy patient became lightheaded and nauseous.  A rapid response was called.  Patient could not stand long enough to check her blood pressure while standing.  Blood pressure high while lying back down. 10/23.  Patient orthostatic.  Started on midodrine  5 mg 3 times daily.  Continue IV fluids.  TED hose. 10/24.  Patient still orthostatic.  Increase midodrine  10 mg 3 times daily.  Continue IV fluids.  A.m. cortisol normal range. 10/25.  Start Florinef for orthostatic hypotension  Assessment and Plan: * Orthostatic hypotension With blood pressure dropping down into the 70s with standing.  Increasing midodrine  to 10 mg 3 times daily and will let fluids run out today.  Hold Toprol .  TED hose.   A.m. cortisol normal range.  Start Florinef.  Hold Jardiance .  Unresponsiveness Secondary to orthostatic hypotension.  Closed rib fracture Numerous rib fractures bilaterally secondary to CPR as outpatient.  Acute kidney injury superimposed on CKD AKI on CKD stage III A.  Creatinine  improved from 1.48 down to 1.15 with IV fluids.  Today's creatinine 1.21.  Patient must eat.  Cardiomyopathy, idiopathic (HCC) EF slightly better than previous at 40 to 45%  Essential hypertension Hold Toprol   Hyperlipidemia On Crestor   Iron deficiency anemia Hemoglobin 10.3  Overweight (BMI 25.0-29.9) BMI 27.72  Shoulder pain, right Patient not moving his right  arm very well since getting CPR.  X-ray of the right shoulder negative.  Able to go through passive range of motion with his right shoulder.        Subjective: Patient having some difficulty moving his right arm.  Still having pain with numerous rib fractures.  Still having orthostatic hypotension.  Will start low-dose Florinef.  Admitted with unresponsive episode found to have severely orthostatic hypotension.  Physical Exam: Vitals:   12/31/23 0600 12/31/23 0746 12/31/23 1232 12/31/23 1504  BP:  (!) 140/70 138/69 (!) 146/73  Pulse:  91  88  Resp:  17  16  Temp:  98 F (36.7 C)  98.3 F (36.8 C)  TempSrc:      SpO2:  94%  94%  Weight: 82.7 kg     Height:       Physical Exam HENT:     Head: Normocephalic.     Mouth/Throat:     Pharynx: No oropharyngeal exudate.  Eyes:     General: Lids are normal.     Conjunctiva/sclera: Conjunctivae normal.  Cardiovascular:     Rate and Rhythm: Normal rate and regular rhythm.     Heart sounds: Normal heart sounds, S1 normal and S2 normal.  Pulmonary:     Breath sounds: Examination of the right-lower field reveals decreased breath sounds. Examination of the left-lower field reveals decreased breath sounds. Decreased breath sounds present. No wheezing, rhonchi or rales.  Abdominal:     Palpations: Abdomen is soft.     Tenderness: There is no abdominal tenderness.  Musculoskeletal:     Right lower leg: No swelling.     Left lower leg: No swelling.  Skin:    General: Skin is warm.     Findings: No rash.  Neurological:     Mental Status: He is alert and oriented to person, place, and time.     Data Reviewed: Shoulder x-ray showing no acute fracture or dislocation of the right shoulder.  Multiple acute right rib fractures involving the anterior aspects of the 3rd, 4th and 5th and 6th ribs. Family Communication: Wife at bedside  Disposition: Status is: Inpatient Remains inpatient appropriate because: Still working on orthostatic vital  signs.  Starting Florinef today.  Planned Discharge Destination: Rehab    Time spent: 28 minutes  Author: Charlie Patterson, MD 12/31/2023 3:26 PM  For on call review [email or web address].

## 2024-01-01 DIAGNOSIS — N179 Acute kidney failure, unspecified: Secondary | ICD-10-CM | POA: Diagnosis not present

## 2024-01-01 DIAGNOSIS — S2243XS Multiple fractures of ribs, bilateral, sequela: Secondary | ICD-10-CM | POA: Diagnosis not present

## 2024-01-01 DIAGNOSIS — I951 Orthostatic hypotension: Secondary | ICD-10-CM | POA: Diagnosis not present

## 2024-01-01 DIAGNOSIS — R4189 Other symptoms and signs involving cognitive functions and awareness: Secondary | ICD-10-CM | POA: Diagnosis not present

## 2024-01-01 MED ORDER — MIDODRINE HCL 5 MG PO TABS
15.0000 mg | ORAL_TABLET | Freq: Three times a day (TID) | ORAL | Status: DC
  Administered 2024-01-01 – 2024-01-03 (×8): 15 mg via ORAL
  Filled 2024-01-01 (×8): qty 3

## 2024-01-01 MED ORDER — FLUDROCORTISONE ACETATE 0.1 MG PO TABS
0.2000 mg | ORAL_TABLET | Freq: Every day | ORAL | Status: DC
  Administered 2024-01-01: 0.2 mg via ORAL
  Filled 2024-01-01 (×2): qty 2

## 2024-01-01 NOTE — Plan of Care (Signed)
  Problem: Education: Goal: Knowledge of General Education information will improve Description: Including pain rating scale, medication(s)/side effects and non-pharmacologic comfort measures Outcome: Progressing   Problem: Activity: Goal: Risk for activity intolerance will decrease Outcome: Progressing   Problem: Elimination: Goal: Will not experience complications related to bowel motility Outcome: Progressing   Problem: Safety: Goal: Ability to remain free from injury will improve Outcome: Progressing   Problem: Skin Integrity: Goal: Risk for impaired skin integrity will decrease Outcome: Progressing   

## 2024-01-01 NOTE — Progress Notes (Addendum)
 Physical Therapy Treatment Patient Details Name: Raymond Bennett. MRN: [id number] DOB: 02/10/1946 Today's Date: 01/01/2024   History of Present Illness Pt is a 78 y.o. male presenting with unresponsiveness/LOC requiring CPR with subsequent rib fractures. CTA neg for PE, CT head neg for acute abnormality. 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    PT Comments  Pt continues to have symptomatic BP issues in standing.  Prior to brief PT session pt was able to use commode and sit for a long while w/o issues however on multiple standing attempts (getting off commode and EOB sit to stands) he tolerated <1 minute upright before onset of significant lightheadedness and I better sit sentiments.  BP did drop 140s/70s in sitting to 70s/50s when tested after being, relatively activity leading up to these readings/symptoms.  Meds to attempt to improve were given prior to PT session.  Pt remains unsafe to return home, will benefit from continued PT.    If plan is discharge home, recommend the following: A little help with walking and/or transfers;A little help with bathing/dressing/bathroom;Assist for transportation;Assistance with cooking/housework;Help with stairs or ramp for entrance   Can travel by private vehicle     No  Equipment Recommendations  Rolling walker (2 wheels)    Recommendations for Other Services       Precautions / Restrictions Precautions Precautions: Fall Restrictions Weight Bearing Restrictions Per Provider Order: No     Mobility  Bed Mobility               General bed mobility comments: seated on arrival, seated post session, not tested    Transfers Overall transfer level: Needs assistance Equipment used: Rolling walker (2  wheels) Transfers: Sit to/from Stand Sit to Stand: Contact guard assist           General transfer comment: multiple sit to stand efforts today, however unable to maintain standing for more than ~30 seconds 2/2 lighteded symptoms.  confirmed objectively with orthostatic drop in BP    Ambulation/Gait               General Gait Details: no true ambulaiton, transfers to/from [facility] only today   Stairs             Wheelchair Mobility     Tilt Bed    Modified Rankin (Stroke Patients Only)       Balance Overall balance assessment: Needs assistance Sitting-balance support: No upper extremity supported, Feet supported Sitting balance-Leahy Scale: Good Sitting balance - Comments: confident and asymptomatic with sitting EOB       Standing balance comment: reliant on RW, tolerating <1 minute before symptoms increase and he needs to sit                            Communication Communication Communication: No apparent difficulties  Cognition Arousal: Alert Behavior During Therapy: WFL for tasks assessed/performed   PT - Cognitive impairments: No apparent impairments                       PT - Cognition Comments: Pt quick to joke and speak off topic Following commands: Intact      Cueing Cueing Techniques: Verbal cues  Exercises  General Comments General comments (skin integrity, edema, etc.): BP in sitting 140/70s, standing 70/50s with reports of significant lightheadedness.      Pertinent Vitals/Pain Pain Assessment Pain Location: b/l broken rib pain from CPR    Home Living                          Prior Function            PT Goals (current goals can now be found in the care plan section) Progress towards PT goals: Not progressing toward goals - comment (BP/symptoms remain a limiter)    Frequency    Min 2X/week      PT Plan      Co-evaluation              AM-PAC PT 6 Clicks Mobility    Outcome Measure  Help needed turning from your back to your side while in a flat bed without using bedrails?: A Little Help needed moving from lying on your back to sitting on the side of a flat bed without using bedrails?: A Little Help needed moving to and from a bed to a chair (including a wheelchair)?: A Little Help needed standing up from a chair using your arms (e.g., wheelchair or bedside chair)?: A Little Help needed to walk in hospital room?: Total Help needed climbing 3-5 steps with a railing? : Total 6 Click Score: 14    End of Session Equipment Utilized During Treatment: Gait belt Activity Tolerance: Other (comment) (orthostatic) Patient left: with bed alarm set;with call bell/phone within reach;with nursing/sitter in room Nurse Communication:  (nursing present t/o session) PT Visit Diagnosis: Muscle weakness (generalized) (M62.81);Difficulty in walking, not elsewhere classified (R26.2)     Time: 8952-8943 PT Time Calculation (min) (ACUTE ONLY): 9 min  Charges:    $Therapeutic Activity: 8-22 mins PT General Charges $$ ACUTE PT VISIT: 1 Visit                     Carmin JONELLE Deed, DPT 01/01/2024, 11:49 AM

## 2024-01-01 NOTE — Progress Notes (Signed)
 Progress Note   Patient: Raymond Bennett. FMW:[id number] DOB: 04-22-1945 DOA: 12/27/2023     3 DOS: the patient was seen and examined on 01/01/2024   Brief hospital course:  78 y.o. 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   Per pt and his wife at bedside, pt was speaking with a lawn care provider in his yard around 2:00 PM, while talking with him started feeling suddenly unwell. Pt was assisted to the ground by a group of landscapers where pt then became unresponsive. It lasted for about 1-2 minutes.  No seizure activity. They did not feel a pulse so initiated CPR.  Patient spontaneously returned to consciousness during this.  Patient states that after the event, when he tried to stand up, he fell dizzy and lightheaded.  No unilateral numbness or tingling in extremities.  No facial droop or slurred speech.  He states that he did not have chest pain before the event, but complains of frontal chest wall pain after CPR. Patient does not have nausea, vomiting, diarrhea or abdominal pain.  No symptoms of UTI.  No feeling of pacemaker flareup.  No fever or chills.     Data reviewed independently and ED Course: pt was found to have trop 9 --> 8, WBC 9.1, stable renal function, BNP 58.4, INR 1.1.  Temperature normal, blood pressure 104/59, 137/71, heart rate 65-93, RR 15, oxygen saturation 100% on room air.  Patient is placed in the PCU for observation.   CT of head: 1. No acute intracranial abnormality. 2. Chronic left precentral gyrus infarcts.   CTA: 1. Negative for acute pulmonary embolus. 2. Cardiomegaly. 3. Multiple bilateral anterior rib fractures. No pneumothorax.  Acute right second,  third, fourth, fifth, 6, seventh anterior rib fractures. Acute left second, fourth, fifth, sixth anterior rib fractures. 4. Aortic atherosclerosis.   Chest x-ray: 1. Mild cardiomegaly. 2. LEFT basilar opacity most likely due to atelectasis and possible minimal pleural effusion.     EKG: I have personally reviewed.  Sinus rhythm, QTc 476, bifascicular block, poor R wave progression.   10/22.  Patient felt okay this morning.  Echocardiogram and stress test ordered by cardiology.  EF 40 to 45%.  Stress test low risk scan.  When working with physical therapy patient became lightheaded and nauseous.  A rapid response was called.  Patient could not stand long enough to check her blood pressure while standing.  Blood pressure high while lying back down. 10/23.  Patient orthostatic.  Started on midodrine  5 mg 3 times daily.  Continue IV fluids.  TED hose. 10/24.  Patient still orthostatic.  Increase midodrine  10 mg 3 times daily.  Continue IV fluids.  A.m. cortisol normal range. 10/25.  Start Florinef for orthostatic hypotension 10/26.  Increase Florinef to 0.2 mg twice daily.  Need to check orthostatics.  Assessment and Plan: * Orthostatic hypotension Continue to check orthostatic vital signs.  Increased midodrine  to 10 mg 3 times daily.  Increase Florinef to 0.2 mg daily.  Hold Toprol .  TED hose.   A.m. cortisol normal range.  Hold Jardiance  today.  Unresponsiveness Secondary to orthostatic hypotension.  Closed rib fracture Numerous rib fractures bilaterally secondary to CPR as outpatient.  Acute kidney injury superimposed on CKD  AKI on CKD stage III A.  Creatinine improved from 1.48 down to 1.15 with IV fluids.  Today's creatinine 1.21.  Patient must eat.  Cardiomyopathy, idiopathic (HCC) EF slightly better than previous at 40 to 45%  Essential hypertension Hold Toprol   Hyperlipidemia On Crestor   Iron deficiency anemia Hemoglobin 10.3  Overweight (BMI 25.0-29.9) BMI  27.39  Shoulder pain, right Patient not moving his right arm very well since getting CPR.  X-ray of the right shoulder negative.  Able to go through passive range of motion with his right shoulder.        Subjective: Patient states he is feeling a little bit better.  Sat up a lot of the day yesterday.  Admitted with unresponsive episode and head and rib fractures secondary to CPR as outpatient.  Patient severely orthostatic.  Physical Exam: Vitals:   01/01/24 0500 01/01/24 0553 01/01/24 0749 01/01/24 0933  BP:  (!) 151/77 (!) 151/82 (!) 141/67  Pulse:  96 94 (!) 104  Resp:  18 17   Temp:  98.6 F (37 C) 97.9 F (36.6 C)   TempSrc:  Oral Oral   SpO2:  92% 94%   Weight: 81.7 kg     Height:       Physical Exam HENT:     Head: Normocephalic.     Mouth/Throat:     Pharynx: No oropharyngeal exudate.  Eyes:     General: Lids are normal.     Conjunctiva/sclera: Conjunctivae normal.  Cardiovascular:     Rate and Rhythm: Normal rate and regular rhythm.     Heart sounds: Normal heart sounds, S1 normal and S2 normal.  Pulmonary:     Breath sounds: No decreased breath sounds, wheezing, rhonchi or rales.  Abdominal:     Palpations: Abdomen is soft.     Tenderness: There is no abdominal tenderness.  Musculoskeletal:     Right lower leg: No swelling.     Left lower leg: No swelling.  Skin:    General: Skin is warm.     Findings: No rash.  Neurological:     Mental Status: He is alert and oriented to person, place, and time.     Data Reviewed: Shoulder x-ray negative for fracture, multiple rib fractures  Family Communication: Spoke with wife at the bedside  Disposition: Status is: Inpatient Remains inpatient appropriate because: Continue to check orthostatic vital signs and see how he does with physical therapy  Planned Discharge Destination: Right now physical therapy recommending rehab because he is orthostatic.    Time spent: 28 minutes  Author: Charlie Patterson,  MD 01/01/2024 10:32 AM  For on call review [email or web address].

## 2024-01-01 NOTE — Plan of Care (Signed)
  Problem: Education: Goal: Knowledge of General Education information will improve Description: Including pain rating scale, medication(s)/side effects and non-pharmacologic comfort measures Outcome: Progressing   Problem: Clinical Measurements: Goal: Cardiovascular complication will be avoided Outcome: Progressing   Problem: Activity: Goal: Risk for activity intolerance will decrease Outcome: Progressing   Problem: Pain Managment: Goal: General experience of comfort will improve and/or be controlled Outcome: Progressing   Problem: Safety: Goal: Ability to remain free from injury will improve Outcome: Progressing   Problem: Skin Integrity: Goal: Risk for impaired skin integrity will decrease Outcome: Progressing

## 2024-01-01 NOTE — Progress Notes (Signed)
   01/01/24 1202  Vitals  Temp 97.8 F (36.6 C)  Temp Source Oral  BP 138/69  MAP (mmHg) 85  BP Location Right Arm  BP Method Automatic  Patient Position (if appropriate) Lying  Pulse Rate (!) 101  Pulse Rate Source Monitor  Resp 17  MEWS COLOR  MEWS Score Color Green  Oxygen Therapy  SpO2 97 %  O2 Device Room Air  MEWS Score  MEWS Temp 0  MEWS Systolic 0  MEWS Pulse 1  MEWS RR 0  MEWS LOC 0  MEWS Score 1   Patient has complaints of dizziness while lying in bed; provider notified via secure chat.  New order placed for Midodrine  15mg .  Safety precautions in place as plan of care continues.

## 2024-01-02 DIAGNOSIS — N179 Acute kidney failure, unspecified: Secondary | ICD-10-CM | POA: Diagnosis not present

## 2024-01-02 DIAGNOSIS — S2243XD Multiple fractures of ribs, bilateral, subsequent encounter for fracture with routine healing: Secondary | ICD-10-CM | POA: Diagnosis not present

## 2024-01-02 DIAGNOSIS — I951 Orthostatic hypotension: Secondary | ICD-10-CM | POA: Diagnosis not present

## 2024-01-02 DIAGNOSIS — R4189 Other symptoms and signs involving cognitive functions and awareness: Secondary | ICD-10-CM | POA: Diagnosis not present

## 2024-01-02 MED ORDER — FLUDROCORTISONE ACETATE 0.1 MG PO TABS
0.3000 mg | ORAL_TABLET | Freq: Every day | ORAL | Status: DC
Start: 2024-01-02 — End: 2024-01-02
  Administered 2024-01-02: 0.3 mg via ORAL
  Filled 2024-01-02: qty 3

## 2024-01-02 MED ORDER — FLUDROCORTISONE ACETATE 0.1 MG PO TABS
0.4000 mg | ORAL_TABLET | Freq: Every day | ORAL | Status: DC
  Administered 2024-01-03: 0.4 mg via ORAL
  Filled 2024-01-02: qty 4

## 2024-01-02 MED ORDER — BISACODYL 10 MG RE SUPP
10.0000 mg | Freq: Once | RECTAL | Status: DC
Start: 2024-01-02 — End: 2024-01-03
  Filled 2024-01-02: qty 1

## 2024-01-02 MED ORDER — LACTULOSE 10 GM/15ML PO SOLN
30.0000 g | Freq: Two times a day (BID) | ORAL | Status: DC
  Administered 2024-01-02 (×2): 30 g via ORAL
  Filled 2024-01-02 (×2): qty 60

## 2024-01-02 NOTE — Plan of Care (Signed)

## 2024-01-02 NOTE — Care Management Important Message (Signed)
 Important Message  Patient Details  Name: Raymond Bennett. MRN: [id number] Date of Birth: September 16, 1945   Important Message Given:  Yes - Medicare IM     Urie Loughner W, CMA 01/02/2024, 2:46 PM

## 2024-01-02 NOTE — Progress Notes (Signed)
 Physical Therapy Treatment Patient Details Name: Raymond Bennett. MRN: [id number] DOB: 06/20/1945 Today's Date: 01/02/2024   History of Present Illness Pt is a 78 y.o. male presenting with unresponsiveness/LOC requiring CPR with subsequent rib fractures. CTA neg for PE, CT head neg for acute abnormality. 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    PT Comments  Recently transferred to/from [facility] with nursing staff for BM.  Symptomatic.  He does wish to try after short seated rest.  Min a x 1 for general mobility mostly due to pain vs strength deficits.  Orthostatics initiated and documented in flow sheets.  Pt unable to stand for first full standing reading due to increasing symptoms and self initiates return to sitting.  BP finished in sitting at 82/55 P 115.  He does remains sitting for several minutes before needing to lay back down due to the need to cough.  Coughing clearly painful and he braces himself with back and arms.  While he was open to getting up to recliner prior, he feels more coughing coming and elects to remain in bed for comfort.  Encouraged OOB to recliner later if tolerated.  Wife shown chair function on bed to assist in a more upright position in a supported fashion during the day.  Voiced understanding.     If plan is discharge home, recommend the following: A little help with walking and/or transfers;A little help with bathing/dressing/bathroom;Assist for transportation;Assistance with cooking/housework;Help with stairs or ramp for entrance   Can travel by private vehicle     No  Equipment Recommendations  Rolling walker (2 wheels)    Recommendations for Other Services       Precautions / Restrictions Precautions Precautions: Fall Recall  of Precautions/Restrictions: Intact Precaution/Restrictions Comments: orthostatic hypotension Restrictions Weight Bearing Restrictions Per Provider Order: No     Mobility  Bed Mobility Overal bed mobility: Needs Assistance Bed Mobility: Supine to Sit, Sit to Supine   Sidelying to sit: Min assist, HOB elevated, Used rails Supine to sit: Min assist, HOB elevated Sit to supine: Contact guard assist, HOB elevated   General bed mobility comments: more for pain control than strength Patient Response: Cooperative  Transfers Overall transfer level: Needs assistance Equipment used: Rolling walker (2 wheels) Transfers: Sit to/from Stand Sit to Stand: Contact guard assist                Ambulation/Gait                   Stairs             Wheelchair Mobility     Tilt Bed Tilt Bed Patient Response: Cooperative  Modified Rankin (Stroke Patients Only)       Balance Overall balance assessment: Needs assistance Sitting-balance support: No upper extremity supported, Feet supported       Standing balance support: Bilateral upper extremity supported Standing balance-Leahy Scale: Fair Standing balance comment: reliant on RW, tolerating <1 minute before symptoms increase and he needs to sit                            Communication Communication Communication: No apparent difficulties  Cognition Arousal: Alert Behavior During Therapy: WFL for tasks assessed/performed   PT - Cognitive  impairments: No apparent impairments                       PT - Cognition Comments: Pt quick to joke and speak off topic Following commands: Intact      Cueing Cueing Techniques: Verbal cues  Exercises      General Comments        Pertinent Vitals/Pain Pain Assessment Pain Assessment: Faces Faces Pain Scale: Hurts whole lot Pain Location: increases with feeling the need to cough.  but increased with generally mobility Pain Descriptors /  Indicators: Aching, Discomfort, Grimacing, Guarding Pain Intervention(s): Limited activity within patient's tolerance, Monitored during session, Repositioned    Home Living                          Prior Function            PT Goals (current goals can now be found in the care plan section) Progress towards PT goals: Not progressing toward goals - comment    Frequency    Min 2X/week      PT Plan      Co-evaluation              AM-PAC PT 6 Clicks Mobility   Outcome Measure  Help needed turning from your back to your side while in a flat bed without using bedrails?: A Little Help needed moving from lying on your back to sitting on the side of a flat bed without using bedrails?: A Little Help needed moving to and from a bed to a chair (including a wheelchair)?: A Little Help needed standing up from a chair using your arms (e.g., wheelchair or bedside chair)?: A Little Help needed to walk in hospital room?: Total Help needed climbing 3-5 steps with a railing? : Total 6 Click Score: 14    End of Session Equipment Utilized During Treatment: Gait belt Activity Tolerance: Other (comment) (orthostatic) Patient left: with bed alarm set;with call bell/phone within reach;with nursing/sitter in room Nurse Communication:  (nursing present t/o session) PT Visit Diagnosis: Muscle weakness (generalized) (M62.81);Difficulty in walking, not elsewhere classified (R26.2)     Time: 0940-1007 PT Time Calculation (min) (ACUTE ONLY): 27 min  Charges:    $Therapeutic Activity: 23-37 mins PT General Charges $$ ACUTE PT VISIT: 1 Visit                   Lauraine Gills, PTA 01/02/24, 10:16 AM

## 2024-01-02 NOTE — Progress Notes (Signed)
 Progress Note   Patient: Raymond Bennett. FMW:[id number] DOB: 04-Nov-1945 DOA: 12/27/2023     4 DOS: the patient was seen and examined on 01/02/2024   Brief hospital course:  78 y.o. 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   Per pt and his wife at bedside, pt was speaking with a lawn care provider in his yard around 2:00 PM, while talking with him started feeling suddenly unwell. Pt was assisted to the ground by a group of landscapers where pt then became unresponsive. It lasted for about 1-2 minutes.  No seizure activity. They did not feel a pulse so initiated CPR.  Patient spontaneously returned to consciousness during this.  Patient states that after the event, when he tried to stand up, he fell dizzy and lightheaded.  No unilateral numbness or tingling in extremities.  No facial droop or slurred speech.  He states that he did not have chest pain before the event, but complains of frontal chest wall pain after CPR. Patient does not have nausea, vomiting, diarrhea or abdominal pain.  No symptoms of UTI.  No feeling of pacemaker flareup.  No fever or chills.     Data reviewed independently and ED Course: pt was found to have trop 9 --> 8, WBC 9.1, stable renal function, BNP 58.4, INR 1.1.  Temperature normal, blood pressure 104/59, 137/71, heart rate 65-93, RR 15, oxygen saturation 100% on room air.  Patient is placed in the PCU for observation.   CT of head: 1. No acute intracranial abnormality. 2. Chronic left precentral gyrus infarcts.   CTA: 1. Negative for acute pulmonary embolus. 2. Cardiomegaly. 3. Multiple bilateral anterior rib fractures. No pneumothorax.  Acute right second,  third, fourth, fifth, 6, seventh anterior rib fractures. Acute left second, fourth, fifth, sixth anterior rib fractures. 4. Aortic atherosclerosis.   Chest x-ray: 1. Mild cardiomegaly. 2. LEFT basilar opacity most likely due to atelectasis and possible minimal pleural effusion.     EKG: I have personally reviewed.  Sinus rhythm, QTc 476, bifascicular block, poor R wave progression.   10/22.  Patient felt okay this morning.  Echocardiogram and stress test ordered by cardiology.  EF 40 to 45%.  Stress test low risk scan.  When working with physical therapy patient became lightheaded and nauseous.  A rapid response was called.  Patient could not stand long enough to check her blood pressure while standing.  Blood pressure high while lying back down. 10/23.  Patient orthostatic.  Started on midodrine  5 mg 3 times daily.  Continue IV fluids.  TED hose. 10/24.  Patient still orthostatic.  Increase midodrine  10 mg 3 times daily.  Continue IV fluids.  A.m. cortisol normal range. 10/25.  Start Florinef for orthostatic hypotension 10/26.  Increase Florinef to 0.2 mg daily.  With blood pressure dropping again with orthostatics today hide increase midodrine  to 15 mg 3 times daily and increase Florinef to 0.3 mg for tomorrow. 10/27.  Patient orthostatic again today with blood pressure dropping down into the 80s.  Increase Florinef to 0.4 mg for tomorrow.  Assessment and Plan: * Orthostatic hypotension Continue to check orthostatic daily.  Increased midodrine  to 15 mg 3 times daily.  Increase Florinef to 0.4 mg daily.  Hold Toprol .  TED hose.   A.m. cortisol normal range.  Hold Jardiance  today.  Unresponsiveness Secondary to orthostatic hypotension.  Closed rib fracture Numerous rib fractures bilaterally secondary to CPR as outpatient.  Acute kidney injury superimposed on CKD AKI on CKD stage III A.  Creatinine improved from 1.48 down to 1.15 with IV fluids.  Last creatinine 1.21.  Patient must  eat.  Cardiomyopathy, idiopathic (HCC) EF slightly better than previous at 40 to 45%  Essential hypertension Hold Toprol   Hyperlipidemia On Crestor   Iron deficiency anemia Hemoglobin 10.3  Overweight (BMI 25.0-29.9) BMI 27.59  Shoulder pain, right Patient moving his right arm better.  X-ray did not show any fracture.   Called cardiology team about the heart monitor not reading correctly.     Subjective: Patient states he feels better today.  Able to move his right arm around good.  Less pain with the rib fractures.  Has severe orthostatic hypotension.  Admitted with unresponsive episode and CPR was performed as outpatient.  They notified me that I got a message that the monitor is not reading correctly.  Physical Exam: Vitals:   01/01/24 1550 01/01/24 2001 01/02/24 0500 01/02/24 0802  BP: (!) 147/67 121/60  (!) 155/82  Pulse: 92 81  95  Resp: 17 18  18   Temp: 99.7 F (37.6 C) 97.9 F (36.6 C)  98.4 F (36.9 C)  TempSrc: Oral   Oral  SpO2: 94% 96%  94%  Weight:   82.3 kg   Height:       Physical Exam HENT:     Head: Normocephalic.     Mouth/Throat:     Pharynx: No oropharyngeal exudate.  Eyes:     General: Lids are normal.     Conjunctiva/sclera: Conjunctivae normal.  Cardiovascular:     Rate and Rhythm: Normal rate and regular rhythm.     Heart sounds: Normal heart sounds, S1 normal and S2 normal.  Pulmonary:     Breath sounds: No decreased breath sounds, wheezing, rhonchi or rales.  Abdominal:     Palpations: Abdomen is soft.     Tenderness: There is no abdominal tenderness.  Musculoskeletal:     Right lower leg: No swelling.     Left lower leg: No swelling.  Skin:    General: Skin is warm.     Findings: No rash.  Neurological:     Mental Status: He is alert and oriented to person, place, and time.     Data Reviewed: Creatinine 1.21, hemoglobin 10.3  Family Communication: Spoke with wife at bedside  Disposition: Status is: Inpatient Remains  inpatient appropriate because: Patient's blood pressure dropped down into the 80s with standing and had to sit back down.  Increase Florinef up to 0.4 mg daily  Planned Discharge Destination: Skilled nursing facility    Time spent: 28 minutes  Author: Charlie Patterson, MD 01/02/2024 2:20 PM  For on call review [email or web address].

## 2024-01-02 NOTE — Progress Notes (Signed)
 Occupational Therapy Treatment Patient Details Name: Raymond Bennett. MRN: [id number] DOB: 11/04/1945 Today's Date: 01/02/2024   History of present illness Pt is a 78 y.o. male presenting with unresponsiveness/LOC requiring CPR with subsequent rib fractures. CTA neg for PE, CT head neg for acute abnormality. 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   OT comments  Patient seen for OT treatment on this date. Upon arrival to room patient semi fowlers in bed, agreeable to treatment. Patient transitioned to EOB with min A to lift trunk. Patient reported pain 5/10 in ribs; per PT, patient still orthostatic this morning during tx session. Focus of session was on improving dynamic sitting balance while incorporating scap mobility HEP to improve posture and reduce pain during mobility/standing, see below for there ex. Patient requested to use BSC at end of visit, see below for level of assist. Patient required increased time to void, patient remained in room on [facility] at bedside with spouse present; spouse agreeable to call RN to transfer back to bed after toileting.        If plan is discharge home, recommend the following:  A lot of help with walking and/or transfers;A lot of help with bathing/dressing/bathroom;Assist for transportation;Help with stairs or ramp for entrance   Equipment Recommendations  BSC/3in1    Recommendations for Other Services      Precautions / Restrictions Precautions Precautions: Fall Recall of Precautions/Restrictions: Intact Precaution/Restrictions Comments: orthostatic hypotension Restrictions Weight Bearing Restrictions Per Provider Order: No       Mobility Bed Mobility Overal bed mobility: Needs Assistance Bed Mobility: Sit to  Supine       Sit to supine: Min assist   General bed mobility comments: min A to lift trunk    Transfers Overall transfer level: Needs assistance Equipment used: Rolling walker (2 wheels) Transfers: Bed to chair/wheelchair/BSC Sit to Stand: Contact guard assist                 Balance Overall balance assessment: Needs assistance Sitting-balance support: No upper extremity supported, Feet supported Sitting balance-Leahy Scale: Good                                     ADL either performed or assessed with clinical judgement   ADL Overall ADL's : Needs assistance/impaired                         Toilet Transfer: Minimal assistance;BSC/3in1 Toilet Transfer Details (indicate cue type and reason): performed SPT from EOB to [facility] with r/w with CGA/min A Toileting- Clothing Manipulation and Hygiene: Contact guard assist              Extremity/Trunk Assessment              Vision       Perception     Praxis     Communication Communication Communication: No apparent difficulties   Cognition Arousal: Alert Behavior During Therapy: WFL for tasks assessed/performed Cognition: No apparent impairments                               Following commands: Intact  Cueing   Cueing Techniques: Verbal cues  Exercises Exercises: Other exercises Other Exercises Other Exercises: scap mobility exercises sitting EOB: neck flexion/extension, lateral neck rotation, shoulder shrugs, shoulder circles, scapular retraction; 10 reps of each    Shoulder Instructions       General Comments      Pertinent Vitals/ Pain       Pain Assessment Pain Assessment: 0-10 Pain Score: 5  Pain Location: ribs Pain Descriptors / Indicators: Aching, Discomfort, Grimacing, Guarding Pain Intervention(s): Limited activity within patient's tolerance  Home Living                                          Prior  Functioning/Environment              Frequency  Min 2X/week        Progress Toward Goals  OT Goals(current goals can now be found in the care plan section)        Plan      Co-evaluation                 AM-PAC OT 6 Clicks Daily Activity     Outcome Measure   Help from another person eating meals?: None Help from another person taking care of personal grooming?: A Little Help from another person toileting, which includes using toliet, bedpan, or urinal?: A Lot Help from another person bathing (including washing, rinsing, drying)?: A Lot Help from another person to put on and taking off regular upper body clothing?: A Lot Help from another person to put on and taking off regular lower body clothing?: A Lot 6 Click Score: 15    End of Session Equipment Utilized During Treatment: Rolling walker (2 wheels)  OT Visit Diagnosis: Pain;Other abnormalities of gait and mobility (R26.89);Unsteadiness on feet (R26.81);Muscle weakness (generalized) (M62.81) Pain - Right/Left: Right   Activity Tolerance Patient tolerated treatment well   Patient Left Other (comment);with call bell/phone within reach;with family/visitor present (on [facility])   Nurse Communication          Time: 8948-8887 OT Time Calculation (min): 21 min  Charges: OT General Charges $OT Visit: 1 Visit OT Treatments $Therapeutic Exercise: 8-22 mins  Rogers Clause, OT/L MSOT, 01/02/2024

## 2024-01-03 ENCOUNTER — Other Ambulatory Visit: Payer: Self-pay

## 2024-01-03 DIAGNOSIS — N179 Acute kidney failure, unspecified: Secondary | ICD-10-CM | POA: Diagnosis not present

## 2024-01-03 DIAGNOSIS — I951 Orthostatic hypotension: Secondary | ICD-10-CM | POA: Diagnosis not present

## 2024-01-03 DIAGNOSIS — E876 Hypokalemia: Secondary | ICD-10-CM | POA: Insufficient documentation

## 2024-01-03 DIAGNOSIS — K5900 Constipation, unspecified: Secondary | ICD-10-CM | POA: Insufficient documentation

## 2024-01-03 DIAGNOSIS — S2243XS Multiple fractures of ribs, bilateral, sequela: Secondary | ICD-10-CM | POA: Diagnosis not present

## 2024-01-03 DIAGNOSIS — K5909 Other constipation: Secondary | ICD-10-CM

## 2024-01-03 DIAGNOSIS — R4189 Other symptoms and signs involving cognitive functions and awareness: Secondary | ICD-10-CM | POA: Diagnosis not present

## 2024-01-03 LAB — BASIC METABOLIC PANEL WITH GFR
Anion gap: 9 (ref 5–15)
BUN: 40 mg/dL — ABNORMAL HIGH (ref 8–23)
CO2: 26 mmol/L (ref 22–32)
Calcium: 8.3 mg/dL — ABNORMAL LOW (ref 8.9–10.3)
Chloride: 103 mmol/L (ref 98–111)
Creatinine, Ser: 1.3 mg/dL — ABNORMAL HIGH (ref 0.61–1.24)
GFR, Estimated: 56 mL/min — ABNORMAL LOW (ref >60)
Glucose, Bld: 113 mg/dL — ABNORMAL HIGH (ref 70–99)
Potassium: 3.2 mmol/L — ABNORMAL LOW (ref 3.5–5.1)
Sodium: 138 mmol/L (ref 135–145)

## 2024-01-03 LAB — CBC
HCT: 29.5 % — ABNORMAL LOW (ref 39.0–52.0)
Hemoglobin: 9.7 g/dL — ABNORMAL LOW (ref 13.0–17.0)
MCH: 28.8 pg (ref 26.0–34.0)
MCHC: 32.9 g/dL (ref 30.0–36.0)
MCV: 87.5 fL (ref 80.0–100.0)
Platelets: 251 K/uL (ref 150–400)
RBC: 3.37 MIL/uL — ABNORMAL LOW (ref 4.22–5.81)
RDW: 14.9 % (ref 11.5–15.5)
WBC: 11.2 K/uL — ABNORMAL HIGH (ref 4.0–10.5)
nRBC: 0 % (ref 0.0–0.2)

## 2024-01-03 MED ORDER — FLUDROCORTISONE ACETATE 0.1 MG PO TABS
0.4000 mg | ORAL_TABLET | Freq: Every day | ORAL | 0 refills | Status: DC
  Filled 2024-01-03: qty 120, 30d supply, fill #0

## 2024-01-03 MED ORDER — ENSURE PLUS HIGH PROTEIN PO LIQD: 237.0000 mL | Freq: Two times a day (BID) | ORAL | Status: AC

## 2024-01-03 MED ORDER — POLYETHYLENE GLYCOL 3350 17 G PO PACK: 17.0000 g | PACK | Freq: Every day | ORAL | Status: DC | PRN

## 2024-01-03 MED ORDER — SODIUM CHLORIDE 0.9 % IV BOLUS
500.0000 mL | Freq: Once | INTRAVENOUS | Status: AC
  Administered 2024-01-03: 500 mL via INTRAVENOUS

## 2024-01-03 MED ORDER — POLYETHYLENE GLYCOL 3350 17 GM/SCOOP PO POWD
17.0000 g | Freq: Every day | ORAL | 0 refills | Status: AC | PRN
  Filled 2024-01-03: qty 238, 14d supply, fill #0

## 2024-01-03 MED ORDER — MIDODRINE HCL 5 MG PO TABS
15.0000 mg | ORAL_TABLET | Freq: Three times a day (TID) | ORAL | 0 refills | Status: DC
  Filled 2024-01-03: qty 270, 30d supply, fill #0

## 2024-01-03 MED ORDER — POTASSIUM CHLORIDE CRYS ER 20 MEQ PO TBCR
40.0000 meq | EXTENDED_RELEASE_TABLET | Freq: Once | ORAL | Status: AC
  Administered 2024-01-03: 40 meq via ORAL
  Filled 2024-01-03: qty 2

## 2024-01-03 NOTE — Progress Notes (Signed)
 Occupational Therapy Treatment Patient Details Name: Raymond Bennett. MRN: [id number] DOB: May 23, 1945 Today's Date: 01/03/2024   History of present illness Pt is a 78 y.o. male presenting with unresponsiveness/LOC requiring CPR with subsequent rib fractures. CTA neg for PE, CT head neg for acute abnormality. 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   OT comments  Patient seen for OT treatment on this date. Upon arrival to room patient patient sitting EOB, reports he had just been asleep but agreeable to treatment. Orthostatic assessed, see flowsheets. Orthostatic VS for the past 24 hrs:  BP- Lying Pulse- Lying BP- Sitting Pulse- Sitting BP- Standing at 0 minutes  01/03/24 1353 139/67 89 121/70 87 91/47   Also assessed at 3 minutes: 91/47, after 7 minutes 88/52. While standing, patient engaged in dynamic balance therapeutic activity with cross midline reaches with single arm support and no arm support with constant steadying A provided to ensure balance maintained. Patient returned to seated after 7:30 minutes. Patient requesting to use urinal at bedside, wife present. OT notified RN of orthostatic readings. Patient making good progress toward goals, will continue to follow POC. Discharge recommendation remains appropriate.        If plan is discharge home, recommend the following:  A lot of help with walking and/or transfers;A lot of help with bathing/dressing/bathroom;Assist for transportation;Help with stairs or ramp for entrance   Equipment Recommendations  BSC/3in1    Recommendations for Other Services      Precautions / Restrictions Precautions Precautions: Fall Recall of Precautions/Restrictions: Intact Precaution/Restrictions Comments:  orthostatic hypotension Restrictions Weight Bearing Restrictions Per Provider Order: No       Mobility Bed Mobility Overal bed mobility: Needs Assistance Bed Mobility: Supine to Sit     Supine to sit: Contact guard          Transfers Overall transfer level: Needs assistance Equipment used: Rolling walker (2 wheels) Transfers: Sit to/from Stand Sit to Stand: Contact guard assist           General transfer comment: orthostatics checked during tx session, sit<>stand with CGA     Balance           Standing balance support: No upper extremity supported, Single extremity supported Standing balance-Leahy Scale: Fair                             ADL either performed or assessed with clinical judgement   ADL                                       Functional mobility during ADLs: Minimal assistance;Contact guard assist      Extremity/Trunk Assessment              Vision       Perception     Praxis     Communication Communication Communication: No apparent difficulties   Cognition Arousal: Alert Behavior During Therapy: WFL for tasks assessed/performed Cognition: No apparent impairments                               Following commands: Intact  Cueing   Cueing Techniques: Verbal cues  Exercises      Shoulder Instructions       General Comments      Pertinent Vitals/ Pain       Pain Assessment Pain Assessment: No/denies pain  Home Living                                          Prior Functioning/Environment              Frequency  Min 2X/week        Progress Toward Goals  OT Goals(current goals can now be found in the care plan section)  Progress towards OT goals: Progressing toward goals  Acute Rehab OT Goals OT Goal Formulation: With patient/family Time For Goal Achievement: 01/12/24 Potential to Achieve Goals: Good ADL Goals Pt Will Perform Grooming:  Independently;sitting;standing Pt Will Perform Lower Body Dressing: with modified independence;sitting/lateral leans;sit to/from stand Pt Will Transfer to Toilet: with modified independence;ambulating;regular height toilet;grab bars Pt Will Perform Toileting - Clothing Manipulation and hygiene: with modified independence;sitting/lateral leans;sit to/from stand  Plan      Co-evaluation                 AM-PAC OT 6 Clicks Daily Activity     Outcome Measure   Help from another person eating meals?: None Help from another person taking care of personal grooming?: A Little Help from another person toileting, which includes using toliet, bedpan, or urinal?: A Lot Help from another person bathing (including washing, rinsing, drying)?: A Lot Help from another person to put on and taking off regular upper body clothing?: A Lot Help from another person to put on and taking off regular lower body clothing?: A Lot 6 Click Score: 15    End of Session Equipment Utilized During Treatment: Rolling walker (2 wheels)  OT Visit Diagnosis: Pain;Other abnormalities of gait and mobility (R26.89);Unsteadiness on feet (R26.81);Muscle weakness (generalized) (M62.81) Pain - Right/Left: Right   Activity Tolerance Patient tolerated treatment well   Patient Left in bed;Other (comment);with call bell/phone within reach;with family/visitor present (using urinal)   Nurse Communication Other (comment) (orthostatics assessed)        Time: 8656-8585 OT Time Calculation (min): 31 min  Charges: OT General Charges $OT Visit: 1 Visit OT Treatments $Self Care/Home Management : 23-37 mins  Rogers Clause, OT/L MSOT, 01/03/2024

## 2024-01-03 NOTE — Plan of Care (Signed)

## 2024-01-03 NOTE — Discharge Instructions (Addendum)
 Take blood pressure standing up daily.  Take florinef at 10am, take midodrine  at waking up, lunch and dinner  Sit for few minutes before getting up, stand for few minutes before walking.

## 2024-01-03 NOTE — Plan of Care (Signed)

## 2024-01-03 NOTE — Discharge Summary (Signed)
 Physician Discharge Summary   Patient: Raymond Bennett. MRN: [id number] DOB: [birth year]  Admit date:     12/27/2023  Discharge date: 01/03/24  Discharge Physician: Charlie Patterson   PCP: Everlene Parris LABOR, MD   Recommendations at discharge:   Follow-up PCP 5 days Follow-up cardiology 1 week  Discharge Diagnoses: Principal Problem:   Orthostatic hypotension Active Problems:   Unresponsiveness   Closed rib fracture   Coronary artery disease   Acute kidney injury superimposed on CKD   Cardiomyopathy, idiopathic (HCC)   Essential hypertension   Hyperlipidemia   Iron deficiency anemia   Overweight (BMI 25.0-29.9)   Shoulder pain, right   Hypokalemia   Constipation  Resolved Problems:   * No resolved hospital problems. *  Hospital Course:  78 y.o. 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   Per pt and his wife at bedside, pt was speaking with a lawn care provider in his yard around 2:00 PM, while talking with him started feeling suddenly unwell. Pt was assisted to the ground by a group of landscapers where pt then became unresponsive. It lasted for about 1-2 minutes.  No seizure activity. They did not feel a pulse so initiated CPR.  Patient spontaneously returned to consciousness during this.  Patient states that after the event, when he tried to stand up, he fell dizzy and lightheaded.  No unilateral numbness or tingling in extremities.  No facial droop or slurred speech.  He states that he did not have chest pain before the event, but complains of frontal chest wall pain after CPR. Patient does not have nausea, vomiting, diarrhea or abdominal pain.  No symptoms of UTI.  No  feeling of pacemaker flareup.  No fever or chills.     Data reviewed independently and ED Course: pt was found to have trop 9 --> 8, WBC 9.1, stable renal function, BNP 58.4, INR 1.1.  Temperature normal, blood pressure 104/59, 137/71, heart rate 65-93, RR 15, oxygen saturation 100% on room air.  Patient is placed in the PCU for observation.   CT of head: 1. No acute intracranial abnormality. 2. Chronic left precentral gyrus infarcts.   CTA: 1. Negative for acute pulmonary embolus. 2. Cardiomegaly. 3. Multiple bilateral anterior rib fractures. No pneumothorax.  Acute right second, third, fourth, fifth, 6, seventh anterior rib fractures. Acute left second, fourth, fifth, sixth anterior rib fractures. 4. Aortic atherosclerosis.   Chest x-ray: 1. Mild cardiomegaly. 2. LEFT basilar opacity most likely due to atelectasis and possible minimal pleural effusion.      10/22.  Patient felt okay this morning.  Echocardiogram and stress test ordered by cardiology.  EF 40 to 45%.  Stress test low risk scan.  When working with physical therapy patient became lightheaded and nauseous.  A rapid response was called.  Patient could not stand long enough to check her blood pressure while standing.  Blood pressure high while lying back down. 10/23.  Patient orthostatic.  Started on midodrine  5 mg 3 times daily.  Continue IV fluids.  TED hose. 10/24.  Patient still orthostatic.  Increase midodrine  10 mg 3 times daily.  Continue IV fluids.  A.m. cortisol normal range. 10/25.  Start Florinef for orthostatic hypotension 10/26.  Increase  Florinef to 0.2 mg daily.  With blood pressure dropping again with orthostatics today hide increase midodrine  to 15 mg 3 times daily and increase Florinef to 0.3 mg for tomorrow. 10/27.  Patient orthostatic again today with blood pressure dropping down into the 80s.  Increase Florinef to 0.4 mg for tomorrow. 10/28.  Patient's blood pressure dropped down into the 90s with  standing.  Patient states he feels okay.  I ambulated the patient around the nursing station personally.  Patient felt okay.  Will discharge home.  Assessment and Plan: * Orthostatic hypotension Patient on midodrine  to 15 mg 3 times daily and Florinef to 0.4 mg daily.  Hold Toprol .  TED hose.   A.m. cortisol normal range.  Hold Jardiance  today.  I personally ambulated the patient around the nursing station twice.  Patient felt okay.  His blood pressure did drop down into the 90s with standing today.  This is an improvement from prior in the hospitalization.  I think his orthostatic hypotension is a longstanding issue.  Hopefully can start to taper off Florinef as outpatient.  Recommend checking orthostatic vital signs with every doctors appointment.  Patient advised to take his blood pressure while standing up also.  Patient advised to stay hydrated.  Unresponsiveness Secondary to orthostatic hypotension.  Closed rib fracture Numerous rib fractures bilaterally secondary to CPR as outpatient.  Currently and less pain and only taking Tylenol  for pain.  Acute kidney injury superimposed on CKD AKI on CKD stage III A.  Creatinine improved from 1.48 down to 1.15 with IV fluids.  Last creatinine 1.30.  Patient must eat.  Patient given an IV fluid bolus today.  Cardiomyopathy, idiopathic (HCC) EF slightly better than previous at 40 to 45%.  Can restart Jardiance  in a couple days.  Essential hypertension Hold Toprol   Hyperlipidemia On Crestor   Iron deficiency anemia Hemoglobin 9.7  Overweight (BMI 25.0-29.9) BMI 26.92  Constipation Resolved  Hypokalemia Replaced potassium  Shoulder pain, right Patient moving his right arm better.  X-ray did not show any fracture.         Consultants: Cardiology Procedures performed: None Disposition: Home health Diet recommendation:  Cardiac diet DISCHARGE MEDICATION: Allergies as of 01/03/2024       Reactions   Bovine (beef)  Protein-containing Drug Products Nausea And Vomiting        Medication List     STOP taking these medications    isosorbide mononitrate 30 MG 24 hr tablet Commonly known as: IMDUR   metoprolol  succinate 25 MG 24 hr tablet Commonly known as: TOPROL -XL   PROBIOTIC PO       TAKE these medications    aspirin  EC 81 MG tablet Take 1 tablet (81 mg total) by mouth daily. Swallow whole.   Cholecalciferol  25 MCG (1000 UT) tablet Take 1,000 Units by mouth daily.   clopidogrel  75 MG tablet Commonly known as: Plavix  Take 1 tablet (75 mg total) by mouth daily.   cyanocobalamin  1000 MCG tablet Commonly known as: VITAMIN B12 Take 1,000 mcg by mouth in the morning.   empagliflozin  10 MG Tabs tablet Commonly known as: JARDIANCE  Take 1 tablet by mouth daily.   feeding supplement Liqd Take 237 mLs by mouth 2 (two) times daily between meals. Start taking on: January 04, 2024   ferrous sulfate  325 (65 FE) MG tablet Take 325 mg by mouth in the morning.   finasteride  1 MG tablet Commonly known as: PROPECIA  Take 1 tablet by mouth every morning.   fludrocortisone 0.1  MG tablet Commonly known as: FLORINEF Take 4 tablets (0.4 mg total) by mouth daily. Start taking on: January 04, 2024   magnesium  oxide 400 MG tablet Commonly known as: MAG-OX Take 400 mg by mouth daily.   midodrine  5 MG tablet Commonly known as: PROAMATINE  Take 3 tablets (15 mg total) by mouth 3 (three) times daily with meals.   nitroGLYCERIN  0.4 MG SL tablet Commonly known as: NITROSTAT  Place 0.4 mg under the tongue every 5 (five) minutes x 3 doses as needed for chest pain.   omeprazole 40 MG capsule Commonly known as: PRILOSEC Take 40 mg by mouth daily before breakfast. Reported on 08/06/2015   polyethylene glycol powder 17 GM/SCOOP powder Commonly known as: GLYCOLAX /MIRALAX  Take 17 g by mouth daily as needed for severe constipation or moderate constipation. Dissolve 1 capful (17g) in 4-8 ounces of  liquid and take by mouth daily as needed.   PreserVision AREDS 2 Caps Take 1 capsule by mouth 2 (two) times daily.   rosuvastatin  20 MG tablet Commonly known as: CRESTOR  Take 20 mg by mouth daily.        Follow-up Information     Florencio Cara BIRCH, MD. Go in 1 week(s).   Specialties: Cardiology, Internal Medicine Contact information: [address] [place] [address] [phone]         Everlene Parris LABOR, MD Follow up in 5 day(s).   Specialty: Family Medicine Contact information: [address] [place] [address] [phone]                Discharge Exam: Filed Weights   01/02/24 0500 01/03/24 0500 01/03/24 1425  Weight: 82.3 kg 81.4 kg 80.3 kg   Physical Exam HENT:     Head: Normocephalic.     Mouth/Throat:     Pharynx: No oropharyngeal exudate.  Eyes:     General: Lids are normal.     Conjunctiva/sclera: Conjunctivae normal.  Cardiovascular:     Rate and Rhythm: Normal rate and regular rhythm.     Heart sounds: Normal heart sounds, S1 normal and S2 normal.  Pulmonary:     Breath sounds: No decreased breath sounds, wheezing, rhonchi or rales.  Abdominal:     Palpations: Abdomen is soft.     Tenderness: There is no abdominal tenderness.  Musculoskeletal:     Right lower leg: No swelling.     Left lower leg: No swelling.  Skin:    General: Skin is warm.     Findings: No rash.  Neurological:     Mental Status: He is alert and oriented to person, place, and time.   I personally ambulated the patient around the nursing station twice.  He had no lightheadedness or dizziness and felt okay.  Condition at discharge: stable  The results of significant diagnostics from this hospitalization (including imaging, microbiology, ancillary and laboratory) are listed below for reference.   Imaging Studies: DG Shoulder Right Result Date: 12/31/2023 EXAM: 1 VIEW(S) XRAY OF THE RIGHT SHOULDER 12/31/2023 09:50:00 AM COMPARISON: None available.  CLINICAL HISTORY: FINDINGS: BONES AND JOINTS: Glenohumeral joint is normally aligned. No acute fracture or dislocation of the shoulder. Multiple acute right rib fractures are identified involving the anterior aspect of the 3rd, 4th, 5th and 6th ribs. These appear unchanged from 12/27/2023. [facility] joint is unremarkable in appearance. SOFT TISSUES: No abnormal calcifications. Visualized lung is unremarkable. IMPRESSION: 1. No acute fracture or dislocation of the right shoulder. 2. Multiple acute right rib fractures involving the anterior  aspect of the 3rd, 4th, 5th, and 6th ribs. Electronically signed by: Waddell Calk MD 12/31/2023 01:25 PM EDT RP Workstation: [id number]   NM Myocar Multi W/Spect Marisela Motion / EF Result Date: 12/28/2023   The study is normal. The study is low risk.   LV perfusion is normal. There is no evidence of ischemia. There is no evidence of infarction.   Left ventricular function is abnormal. Global function is mildly reduced. Nuclear stress EF: 48%.   ECHOCARDIOGRAM COMPLETE Result Date: 12/28/2023    ECHOCARDIOGRAM REPORT   Patient Name:   Damauri Minion. Date of Exam: 12/28/2023 Medical Rec #:  [id number]        Height:       68.0 in Accession #:    [id number]       Weight:       179.0 lb Date of Birth:  16-Feb-1946         BSA:          1.950 m Patient Age:    78 years         BP:           150/63 mmHg Patient Gender: M                HR:           64 bpm. Exam Location:  [facility] Procedure: 2D Echo, Cardiac Doppler and Color Doppler (Both Spectral and Color            Flow Doppler were utilized during procedure). Indications:     Syncope R55  History:         Patient has prior history of Echocardiogram examinations, most                  recent 09/06/2023. CAD. Shortness of breath on exertion.  Sonographer:     Christopher Furnace Referring Phys:  [id number] CARALYN HUDSON Diagnosing Phys: Keller Alluri IMPRESSIONS  1. Left ventricular ejection fraction, by estimation, is 40 to 45%. The left  ventricle has mildly decreased function. Left ventricular endocardial border not optimally defined to evaluate regional wall motion. There is mild left ventricular hypertrophy.  Left ventricular diastolic parameters are consistent with Grade I diastolic dysfunction (impaired relaxation).  2. Right ventricular systolic function is normal. The right ventricular size is normal.  3. The mitral valve is normal in structure. Trivial mitral valve regurgitation.  4. The aortic valve is tricuspid. There is mild thickening of the aortic valve. Aortic valve regurgitation is trivial.  5. The inferior vena cava is normal in size with greater than 50% respiratory variability, suggesting right atrial pressure of 3 mmHg. FINDINGS  Left Ventricle: Left ventricular ejection fraction, by estimation, is 40 to 45%. The left ventricle has mildly decreased function. Left ventricular endocardial border not optimally defined to evaluate regional wall motion. The left ventricular internal cavity size was normal in size. There is mild left ventricular hypertrophy. Left ventricular diastolic parameters are consistent with Grade I diastolic dysfunction (impaired relaxation).  LV Wall Scoring: The mid anterolateral segment, mid inferoseptal segment, and mid anterior segment are hypokinetic. The inferior wall, basal anteroseptal segment, basal inferolateral segment, basal anterolateral segment, basal anterior segment, and basal inferoseptal segment are normal. Right Ventricle: The right ventricular size is normal. No increase in right ventricular wall thickness. Right ventricular systolic function is normal. Left Atrium: Left atrial size was normal in size. Right Atrium: Right atrial size was normal in size. Pericardium: There is no evidence  of pericardial effusion. Mitral Valve: The mitral valve is normal in structure. Mild mitral annular calcification. Trivial mitral valve regurgitation. MV peak gradient, 10.0 mmHg. The mean mitral valve  gradient is 5.0 mmHg. Tricuspid Valve: The tricuspid valve is normal in structure. Tricuspid valve regurgitation is trivial. Aortic Valve: The aortic valve is tricuspid. There is mild thickening of the aortic valve. Aortic valve regurgitation is trivial. Aortic valve mean gradient measures 3.3 mmHg. Aortic valve peak gradient measures 5.4 mmHg. Aortic valve area, by VTI measures 1.81 cm. Pulmonic Valve: The pulmonic valve was not well visualized. Pulmonic valve regurgitation is trivial. Aorta: The aortic root and ascending aorta are structurally normal, with no evidence of dilitation. Venous: The inferior vena cava is normal in size with greater than 50% respiratory variability, suggesting right atrial pressure of 3 mmHg. IAS/Shunts: The atrial septum is grossly normal.  LEFT VENTRICLE PLAX 2D LVIDd:         3.70 cm   Diastology LVIDs:         2.60 cm   LV e' medial:    4.13 cm/s LV PW:         1.30 cm   LV E/e' medial:  23.7 LV IVS:        1.20 cm   LV e' lateral:   7.72 cm/s LVOT diam:     2.00 cm   LV E/e' lateral: 12.7 LV SV:         48 LV SV Index:   24 LVOT Area:     3.14 cm  RIGHT VENTRICLE RV Basal diam:  3.10 cm RV Mid diam:    2.80 cm LEFT ATRIUM           Index        RIGHT ATRIUM           Index LA diam:      3.00 cm 1.54 cm/m   RA Area:     11.50 cm LA Vol (A4C): 23.3 ml 11.95 ml/m  RA Volume:   21.60 ml  11.08 ml/m  AORTIC VALVE AV Area (Vmax):    1.85 cm AV Area (Vmean):   1.72 cm AV Area (VTI):     1.81 cm AV Vmax:           116.67 cm/s AV Vmean:          82.633 cm/s AV VTI:            0.264 m AV Peak Grad:      5.4 mmHg AV Mean Grad:      3.3 mmHg LVOT Vmax:         68.70 cm/s LVOT Vmean:        45.200 cm/s LVOT VTI:          0.152 m LVOT/AV VTI ratio: 0.58  AORTA Ao Root diam: 2.90 cm MITRAL VALVE                TRICUSPID VALVE MV Area (PHT): 7.66 cm     TR Peak grad:   21.3 mmHg MV Area VTI:   1.66 cm     TR Vmax:        231.00 cm/s MV Peak grad:  10.0 mmHg MV Mean grad:  5.0 mmHg      SHUNTS MV Vmax:       1.58 m/s     Systemic VTI:  0.15 m MV Vmean:      109.0 cm/s   Systemic Diam: 2.00 cm MV  Decel Time: 99 msec MV E velocity: 98.00 cm/s MV A velocity: 155.00 cm/s MV E/A ratio:  0.63 Keller Paterson Electronically signed by Keller Paterson Signature Date/Time: 12/28/2023/12:02:44 PM    Final    CT Angio Chest PE W and/or Wo Contrast Result Date: 12/27/2023 CLINICAL DATA:  Syncope versus cardiac arrest chest pain EXAM: CT ANGIOGRAPHY CHEST WITH CONTRAST TECHNIQUE: Multidetector CT imaging of the chest was performed using the standard protocol during bolus administration of intravenous contrast. Multiplanar CT image reconstructions and MIPs were obtained to evaluate the vascular anatomy. RADIATION DOSE REDUCTION: This exam was performed according to the departmental dose-optimization program which includes automated exposure control, adjustment of the mA and/or kV according to patient size and/or use of iterative reconstruction technique. CONTRAST:  75mL OMNIPAQUE  IOHEXOL  350 MG/ML SOLN COMPARISON:  Chest x-ray 12/27/2023, CT chest 12/08/2023 FINDINGS: Cardiovascular: Satisfactory opacification of the pulmonary arteries to the segmental level. No evidence of pulmonary embolism. Moderate aortic atherosclerosis. No aneurysm. Coronary vascular calcification. Mild cardiomegaly. No pericardial effusion. Lead less pacemaker in the right ventricle. Mediastinum/Nodes: Patent trachea. No thyroid mass. Subcentimeter mediastinal lymph nodes. Esophagus within normal limits. Lungs/Pleura: No acute airspace disease, pleural effusion or pneumothorax. Mild atelectasis in the lower lobes. Upper Abdomen: No acute finding. Musculoskeletal: Sternum appears intact. Acute right second, third, fourth, fifth, 6, seventh anterior rib fractures. Acute left second, fourth, fifth, sixth anterior rib fractures. Review of the MIP images confirms the above findings. IMPRESSION: 1. Negative for acute pulmonary embolus. 2.  Cardiomegaly. 3. Multiple bilateral anterior rib fractures. No pneumothorax. 4. Aortic atherosclerosis. Aortic Atherosclerosis (ICD10-I70.0). Electronically Signed   By: Luke Bun M.D.   On: 12/27/2023 17:49   CT HEAD WO CONTRAST ( ) Result Date: 12/27/2023 EXAM: CT HEAD WITHOUT CONTRAST 12/27/2023 05:24:39 PM TECHNIQUE: CT of the head was performed without the administration of intravenous contrast. Automated exposure control, iterative reconstruction, and/or weight based adjustment of the mA/kV was utilized to reduce the radiation dose to as low as reasonably achievable. COMPARISON: 09/07/2023 CLINICAL HISTORY: Syncope. Patient arrived via EMS for syncope versus cardiac arrest. Patient was outside and started to feel weak, assisted to the ground, then became unresponsive. CPR was initiated due to no pulse. Patient's only current complaint is chest pain from CPR. Patient states this happened in July when a stent was placed at [facility]. FINDINGS: BRAIN AND VENTRICLES: No acute hemorrhage. No evidence of acute infarct. Chronic infarct of left precentral gyrus. No hydrocephalus. No extra-axial collection. No mass effect or midline shift. Atherosclerotic calcifications in cavernous internal carotid arteries. ORBITS: Bilateral lens replacement. SINUSES: No acute abnormality. SOFT TISSUES AND SKULL: No acute soft tissue abnormality. No skull fracture. IMPRESSION: 1. No acute intracranial abnormality. 2. Chronic left precentral gyrus infarcts. Electronically signed by: Norman Gatlin MD 12/27/2023 05:44 PM EDT RP Workstation: [id number]   DG Chest 2 View Result Date: 12/27/2023 CLINICAL DATA:  Chest pain EXAM: CHEST - 2 VIEW COMPARISON:  12/06/2023 FINDINGS: Mild cardiomegaly. Implanted cardiac pacer again seen. No significant pulmonary vascular congestion. Patchy opacity at the LEFT lung base obscuring the LEFT hemidiaphragm is most likely be due to atelectasis. IMPRESSION: 1. Mild cardiomegaly. 2. LEFT basilar  opacity most likely due to atelectasis and possible minimal pleural effusion. Electronically Signed   By: Aliene Lloyd M.D.   On: 12/27/2023 15:32   CT Angio Chest Pulmonary Embolism (PE) W or WO Contrast Result Date: 12/08/2023 EXAM: CTA CHEST 12/08/2023 10:32:54 AM TECHNIQUE: CTA of the chest was performed without and with the  administration of 75 mL of iohexol  (OMNIPAQUE ) 350 MG/ML injection. Multiplanar reformatted images are provided for review. MIP images are provided for review. Automated exposure control, iterative reconstruction, and/or weight based adjustment of the mA/kV was utilized to reduce the radiation dose to as low as reasonably achievable. COMPARISON: CT of the chest dated 10/11/2022. CLINICAL HISTORY: Pt c/o mid chest pain and pressure x 2 days. He also had a Elev D-Dimer. 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 Hx Pacemaker and Cardiac stents. FINDINGS: PULMONARY ARTERIES: Pulmonary arteries are adequately opacified for evaluation. No acute pulmonary embolus. Main pulmonary artery is normal in caliber. MEDIASTINUM: The heart and pericardium demonstrate no acute abnormality. There is mild-to-moderate calcific plaque within the aortic arch. LYMPH NODES: No mediastinal, hilar or axillary lymphadenopathy. LUNGS AND PLEURA: The lungs are without acute process. No focal consolidation or pulmonary edema. No evidence of pleural effusion or pneumothorax. UPPER ABDOMEN: Limited images of the upper abdomen are unremarkable. SOFT TISSUES AND BONES: No acute bone or soft tissue abnormality. IMPRESSION: 1. No pulmonary embolism or acute pulmonary abnormality. 2. Mild-to-moderate calcific aortic arch atherosclerosis. Electronically signed by: Evalene Coho MD 12/08/2023 10:57 AM EDT RP Workstation: [id number]   DG Chest 2 View Result Date: 12/06/2023 CLINICAL DATA:  Left-sided chest pressure beginning this morning. EXAM: CHEST - 2 VIEW COMPARISON:  09/05/2023 FINDINGS: Lungs are adequately  inflated and otherwise clear. Small cardiac device unchanged projecting over the left heart. Cardiomediastinal silhouette and remainder of the exam is unchanged. IMPRESSION: No active cardiopulmonary disease. Electronically Signed   By: Toribio Agreste M.D.   On: 12/06/2023 16:19    Microbiology: Results for orders placed or performed in visit on 11/07/19  Novel Coronavirus, NAA ([facility])     Status: None   Collection Time: 11/07/19  2:03 PM   Specimen: Nasopharyngeal(NP) swabs in vial transport medium   Nasopharynge  Screenin  Result Value Ref Range Status   SARS-CoV-2, NAA Not Detected Not Detected Final    Comment: This nucleic acid amplification test was developed and its performance characteristics determined by [employer]. Nucleic acid amplification tests include RT-PCR and TMA. This test has not been FDA cleared or approved. This test has been authorized by FDA under an Emergency Use Authorization (EUA). This test is only authorized for the duration of time the declaration that circumstances exist justifying the authorization of the emergency use of in vitro diagnostic tests for detection of SARS-CoV-2 virus and/or diagnosis of COVID-19 infection under section 564(b)(1) of the Act, 21 U.S.C. 639aaa-6(a) (1), unless the authorization is terminated or revoked sooner. When diagnostic testing is negative, the possibility of a false negative result should be considered in the context of a patient's recent exposures and the presence of clinical signs and symptoms consistent with COVID-19. An individual without symptoms of COVID-19 and who is not shedding SARS-CoV-2 virus wo uld expect to have a negative (not detected) result in this assay.     Labs: CBC: Recent Labs  Lab 12/28/23 0500 12/29/23 0348 12/30/23 0450 12/31/23 0339 01/03/24 0456  WBC 13.4* 11.3* 11.0*  --  11.2*  HGB 9.9* 10.2* 10.8* 10.3* 9.7*  HCT 31.3* 32.2* 32.8*  --  29.5*  MCV 91.0 90.4 88.9  --   87.5  PLT 264 245 249  --  251   Basic Metabolic Panel: Recent Labs  Lab 12/28/23 0500 12/29/23 0348 12/30/23 0450 12/31/23 0339 01/03/24 0456  NA 137 139 137 136 138  K 4.3 4.5 3.8 3.7 3.2*  CL 102 103 104 103 103  CO2 25 26 25  21* 26  GLUCOSE 122* 93 94 105* 113*  BUN 29* 29* 28* 34* 40*  CREATININE 1.41* 1.15 1.26* 1.21 1.30*  CALCIUM  8.7* 8.7* 8.6* 8.5* 8.3*   Liver Function Tests: No results for input(s): AST, ALT, ALKPHOS, BILITOT, PROT, ALBUMIN in the last 168 hours. CBG: Recent Labs  Lab 12/28/23 1447  GLUCAP 104*    Discharge time spent: greater than 30 minutes.  Signed: Charlie Patterson, MD [facility] 01/03/2024

## 2024-01-03 NOTE — Assessment & Plan Note (Signed)
 Resolved

## 2024-01-03 NOTE — Assessment & Plan Note (Signed)
Replaced potassium.

## 2024-01-04 ENCOUNTER — Telehealth: Payer: Self-pay

## 2024-01-04 NOTE — Transitions of Care (Post Inpatient/ED Visit) (Signed)
 01/04/2024  Name: Raymond Bennett. MRN: [id number] DOB: 11-19-1945  Today's TOC FU Call Status: Today's TOC FU Call Status:: Successful TOC FU Call Completed TOC FU Call Complete Date: 01/04/24 Patient's Name and Date of Birth confirmed.  Transition Care Management Follow-up Telephone Call Date of Discharge: 01/03/24 Discharge Facility: [facility]) Type of Discharge: Inpatient Admission Primary Inpatient Discharge Diagnosis:: Orthostatic Hypotension How have you been since you were released from the hospital?: Better Any questions or concerns?: No  Items Reviewed: Did you receive and understand the discharge instructions provided?: Yes Medications obtained,verified, and reconciled?: Yes (Medications Reviewed) Any new allergies since your discharge?: No Dietary orders reviewed?: Yes Type of Diet Ordered:: Low sodium Heart Healthy Do you have support at home?: Yes People in Home [RPT]: spouse Name of Support/Comfort Primary Source: Gideon Burstein  Medications Reviewed Today: Medications Reviewed Today     Reviewed by Moises Reusing, RN (Case Manager) on 01/04/24 at 1412  Med List Status: <None>   Medication Order Taking? Sig Documenting Provider Last Dose Status Informant  aspirin  EC 81 MG tablet [id number] No Take 1 tablet (81 mg total) by mouth daily. Swallow whole. Maree Hue, MD 12/27/2023 Active Spouse/Significant Other  Cholecalciferol  25 MCG (1000 UT) tablet [id number]  Take 1,000 Units by mouth daily. [provider]  Active Spouse/Significant Other  clopidogrel  (PLAVIX ) 75 MG tablet [id number] No Take 1 tablet (75 mg total) by mouth daily. [place], Caralyn, PA-C 12/27/2023 Active Spouse/Significant Other  empagliflozin  (JARDIANCE ) 10 MG TABS tablet [id number] No Take 1 tablet by mouth daily. [provider] 12/27/2023 Active Spouse/Significant Other  feeding supplement (ENSURE PLUS HIGH PROTEIN) LIQD [id number]  Take 237 mLs by  mouth 2 (two) times daily between meals. Josette Ade, MD  Active   ferrous sulfate  325 (65 FE) MG tablet [id number] No Take 325 mg by mouth in the morning. [provider] 12/27/2023 Active Spouse/Significant Other  finasteride  (PROPECIA ) 1 MG tablet [id number] No Take 1 tablet by mouth every morning. [provider] 12/27/2023 Active Spouse/Significant Other  fludrocortisone (FLORINEF) 0.1 MG tablet [id number]  Take 4 tablets (0.4 mg total) by mouth daily. Josette Ade, MD  Active   magnesium  oxide (MAG-OX) 400 MG tablet [id number] No Take 400 mg by mouth daily. [provider] 12/27/2023 Active Spouse/Significant Other  midodrine  (PROAMATINE ) 5 MG tablet [id number]  Take 3 tablets (15 mg total) by mouth 3 (three) times daily with meals. Josette Ade, MD  Active   Multiple Vitamins-Minerals (PRESERVISION AREDS 2) CAPS [id number] No Take 1 capsule by mouth 2 (two) times daily. [provider] 12/27/2023 Active Spouse/Significant Other  nitroGLYCERIN  (NITROSTAT ) 0.4 MG SL tablet [id number]  Place 0.4 mg under the tongue every 5 (five) minutes x 3 doses as needed for chest pain. [provider]  Active Spouse/Significant Other  omeprazole (PRILOSEC) 40 MG capsule [id number] No Take 40 mg by mouth daily before breakfast. Reported on 08/06/2015 [provider] 12/27/2023 Active Spouse/Significant Other           Med Note CLAUD, MICHEAL T   Thu Jun 16, 2023  2:33 PM)    polyethylene glycol powder (GLYCOLAX /MIRALAX ) 17 GM/SCOOP powder [id number]  Take 17 g by mouth daily as needed for severe constipation or moderate constipation. Dissolve 1 capful (17g) in 4-8 ounces of liquid and take by mouth daily as needed. Josette Ade, MD  Active   rosuvastatin  (CRESTOR ) 20 MG tablet [id number] No Take 20 mg by mouth daily. [provider]  12/27/2023 Active Spouse/Significant Other  vitamin B-12 (CYANOCOBALAMIN ) 1000 MCG tablet [id number] No Take  1,000 mcg by mouth in the morning. [provider] 12/27/2023 Active Spouse/Significant Other            Home Care and Equipment/Supplies: Were Home Health Services Ordered?: NA Any new equipment or medical supplies ordered?: NA  Functional Questionnaire: Do you need assistance with bathing/showering or dressing?: No Do you need assistance with meal preparation?: No Do you need assistance with eating?: No Do you have difficulty maintaining continence: No Do you need assistance with getting out of bed/getting out of a chair/moving?: No Do you have difficulty managing or taking your medications?: No  Follow up appointments reviewed: PCP Follow-up appointment confirmed?: Yes Date of PCP follow-up appointment?: 01/09/24 Follow-up Provider: Clydia Juneau Specialist [facility] Follow-up appointment confirmed?: NA Do you need transportation to your follow-up appointment?: No Do you understand care options if your condition(s) worsen?: Yes-patient verbalized understanding  SDOH Interventions Today    Flowsheet Row Most Recent Value  SDOH Interventions   Food Insecurity Interventions Intervention Not Indicated  Housing Interventions Intervention Not Indicated  Transportation Interventions Intervention Not Indicated  Utilities Interventions Intervention Not Indicated    Medford Balboa, BSN, RN [place]  [facility]BC[facility] - Population Health RN Care Manager [phone]

## 2024-01-09 ENCOUNTER — Ambulatory Visit

## 2024-01-09 VITALS — BP 138/80 | HR 77 | Ht 68.0 in | Wt 180.0 lb

## 2024-01-09 DIAGNOSIS — N1832 Chronic kidney disease, stage 3b: Secondary | ICD-10-CM | POA: Diagnosis not present

## 2024-01-09 DIAGNOSIS — I2510 Atherosclerotic heart disease of native coronary artery without angina pectoris: Secondary | ICD-10-CM

## 2024-01-09 DIAGNOSIS — I951 Orthostatic hypotension: Secondary | ICD-10-CM | POA: Diagnosis not present

## 2024-01-09 DIAGNOSIS — D631 Anemia in chronic kidney disease: Secondary | ICD-10-CM

## 2024-01-09 NOTE — Progress Notes (Signed)
 Progress Note  Physician: [name] A [name], MD   HPI: Raymond Bennett. is a 78 y.o. male presenting on 01/09/2024 for Follow-up (Follow up from hospital stay. Patient states he's been the hospital for a week. He also states his feet and ankles are swollen. Patient is concerned about it.) .  Discussed the use of AI scribe software for clinical note transcription with the patient, who gave verbal consent to proceed.  History of Present Illness   Raymond Bennett. is an 78 year old male with coronary artery disease and ischemic cardiomyopathy who presents for a hospital follow-up visit after a syncopal episode.  Syncope and unresponsiveness - Experienced a syncopal episode while outside, lasting one to two minutes without seizure activity - CPR was initiate; spontaneous return of consciousness during the procedure - Hospitalized from October 21 to January 03, 2024, following the event - Recalls two episodes of unresponsiveness: one outside after standing up, another in the hospital in a previous admission  Orthostatic hypotension and medication effects - Started on fludrocortisone and midodrine  15 mg three times daily for orthostatic hypotension during hospitalization - Metoprolol  was discontinued - Significant swelling in ankles and feet since starting new medications, attributed to medication side effects - No shortness of breath or chest pain - Difficulty sleeping, associated with medication side effects - Uses compression stockings to manage lower extremity swelling  Rib fractures and associated pain - Sustained multiple bilateral rib fractures: right second to seventh anterior ribs - Left basilar opacity noted on imaging - Pain with coughing, requiring use of a pillow for support - Pain managed with Tylenol   Functional status and recovery - Improvement in overall condition since hospital discharge - Able to walk outside, uses a support cane as a  precaution  Cardiac and renal comorbidities - History of significant coronary artery disease and ischemic cardiomyopathy with ejection fraction 35-40% - History of sick sinus syndrome - Chronic kidney disease stage 3A; recent nephrology evaluation noted slight decline in function but overall stability  Other chronic conditions - History of anemia and neuropathy.  Hgb 9.7 on inpatient labs done 10/28        Medical history:  Relevant past medical, surgical, family and social history reviewed and updated as indicated. Interim medical history since our last visit reviewed.  Allergies and medications reviewed and updated.   ROS: Negative unless specifically indicated above in HPI.    Current Outpatient Medications:    aspirin  EC 81 MG tablet, Take 1 tablet (81 mg total) by mouth daily. Swallow whole., Disp: , Rfl:    Cholecalciferol  25 MCG (1000 UT) tablet, Take 1,000 Units by mouth daily., Disp: , Rfl:    clopidogrel  (PLAVIX ) 75 MG tablet, Take 1 tablet (75 mg total) by mouth daily., Disp: 30 tablet, Rfl: 0   feeding supplement (ENSURE PLUS HIGH PROTEIN) LIQD, Take 237 mLs by mouth 2 (two) times daily between meals., Disp: , Rfl:    ferrous sulfate  325 (65 FE) MG tablet, Take 325 mg by mouth in the morning., Disp: , Rfl:    finasteride  (PROPECIA ) 1 MG tablet, Take 1 tablet by mouth every morning., Disp: , Rfl:    fludrocortisone (FLORINEF) 0.1 MG tablet, Take 4 tablets (0.4 mg total) by mouth daily., Disp: 120 tablet, Rfl: 0   JARDIANCE  10 MG TABS tablet, Take 10 mg by mouth daily., Disp: , Rfl:    magnesium  oxide (MAG-OX) 400 MG  tablet, Take 400 mg by mouth daily., Disp: , Rfl:    midodrine  (PROAMATINE ) 5 MG tablet, Take 3 tablets (15 mg total) by mouth 3 (three) times daily with meals., Disp: 270 tablet, Rfl: 0   Multiple Vitamins-Minerals (PRESERVISION AREDS 2) CAPS, Take 1 capsule by mouth 2 (two) times daily., Disp: , Rfl:    nitroGLYCERIN  (NITROSTAT ) 0.4 MG SL tablet, Place 0.4  mg under the tongue every 5 (five) minutes x 3 doses as needed for chest pain., Disp: , Rfl:    omeprazole (PRILOSEC) 40 MG capsule, Take 40 mg by mouth daily before breakfast. Reported on 08/06/2015, Disp: , Rfl:    polyethylene glycol powder (GLYCOLAX /MIRALAX ) 17 GM/SCOOP powder, Take 17 g by mouth daily as needed for severe constipation or moderate constipation. Dissolve 1 capful (17g) in 4-8 ounces of liquid and take by mouth daily as needed., Disp: 1530 g, Rfl: 0   rosuvastatin  (CRESTOR ) 20 MG tablet, Take 20 mg by mouth daily., Disp: , Rfl:    vitamin B-12 (CYANOCOBALAMIN ) 1000 MCG tablet, Take 1,000 mcg by mouth in the morning., Disp: , Rfl:        Objective:     BP 138/80   Pulse 77   Ht 5' 8 (1.727 m)   Wt 180 lb (81.6 kg)   SpO2 98%   BMI 27.37 kg/m   Wt Readings from Last 3 Encounters:  01/09/24 180 lb (81.6 kg)  01/03/24 177 lb 0.5 oz (80.3 kg)  11/23/23 179 lb 4 oz (81.3 kg)    Physical Exam  Physical Exam Vitals reviewed.  Constitutional:      Appearance: Normal appearance. Well-developed with normal weight.  Cardiovascular:     Rate and Rhythm: Normal rate and regular rhythm. Normal heart sounds. Normal peripheral pulses Pulmonary:     Normal breath sounds with normal effort Skin:    General: Skin is warm and dry without noticeable rash. Neurological:     General: No focal deficit present.  Psychiatric:        Mood and Affect: Mood, behavior and cognition normal      Assessment & Plan:  No diagnosis found.  No orders of the defined types were placed in this encounter.    Assessment and Plan    Syncope and collapse with orthostatic hypotension Recent syncopal episode with some component of  orthostatic hypotension. Midodrine  initiated. Discussed side effects including anxiety, insomnia, and swelling. - Discuss with cardiologist about weaning off midodrine . - Monitor blood pressure sitting and standing. - Use compression stockings or diabetes  socks.  Ischemic cardiomyopathy and sick sinus with reduced ejection fraction and coronary artery disease, status post drug-eluting stent - ongoing cardiology f/u   Multiple right anterior rib fractures Multiple right anterior rib fractures from CPR. Pain managed with Tylenol . Discussed pneumonia risk due to reduced coughing. - Continue Tylenol  for pain management. - Avoid Tylenol  PM. - Consider melatonin for sleep. - Encourage use of a pillow when coughing. - Advise wearing a mask in public.  Left basilar atelectasis and minimal pleural effusion Left basilar atelectasis and minimal pleural effusion likely related to recent hospitalization and rib fractures.   - Normal lung sounds today.  Encourage coughing when necessary  Chronic kidney disease stage 3a Chronic kidney disease stage 3a with recent labs showing well-managed kidney function. - Monitor kidney function with regular blood work every 3-4 months.  He has seen nephrology  Anemia Anemia with recent hemoglobin of 9.7, likely multifactorial. - Recheck hemoglobin in 3  weeks.  Iron deficiency history   Patient currently wearing a Holter monitor.

## 2024-01-10 DIAGNOSIS — I10 Essential (primary) hypertension: Secondary | ICD-10-CM | POA: Diagnosis not present

## 2024-01-10 DIAGNOSIS — I495 Sick sinus syndrome: Secondary | ICD-10-CM | POA: Diagnosis not present

## 2024-01-10 DIAGNOSIS — Z950 Presence of cardiac pacemaker: Secondary | ICD-10-CM | POA: Diagnosis not present

## 2024-01-10 DIAGNOSIS — R001 Bradycardia, unspecified: Secondary | ICD-10-CM | POA: Diagnosis not present

## 2024-01-10 DIAGNOSIS — I5022 Chronic systolic (congestive) heart failure: Secondary | ICD-10-CM | POA: Diagnosis not present

## 2024-01-10 DIAGNOSIS — I429 Cardiomyopathy, unspecified: Secondary | ICD-10-CM | POA: Diagnosis not present

## 2024-01-10 DIAGNOSIS — Z955 Presence of coronary angioplasty implant and graft: Secondary | ICD-10-CM | POA: Diagnosis not present

## 2024-01-10 DIAGNOSIS — I2510 Atherosclerotic heart disease of native coronary artery without angina pectoris: Secondary | ICD-10-CM | POA: Diagnosis not present

## 2024-01-10 DIAGNOSIS — E7849 Other hyperlipidemia: Secondary | ICD-10-CM | POA: Diagnosis not present

## 2024-01-13 ENCOUNTER — Other Ambulatory Visit: Payer: Self-pay

## 2024-01-13 DIAGNOSIS — D631 Anemia in chronic kidney disease: Secondary | ICD-10-CM

## 2024-01-13 DIAGNOSIS — N1832 Chronic kidney disease, stage 3b: Secondary | ICD-10-CM

## 2024-01-16 ENCOUNTER — Other Ambulatory Visit
Admission: RE | Admit: 2024-01-16 | Discharge: 2024-01-16 | Disposition: A | Discharge status: Home / Self Care | Source: Ambulatory Visit

## 2024-01-16 DIAGNOSIS — N1832 Chronic kidney disease, stage 3b: Secondary | ICD-10-CM | POA: Diagnosis not present

## 2024-01-16 DIAGNOSIS — D631 Anemia in chronic kidney disease: Secondary | ICD-10-CM | POA: Insufficient documentation

## 2024-01-16 DIAGNOSIS — R7309 Other abnormal glucose: Secondary | ICD-10-CM | POA: Insufficient documentation

## 2024-01-16 LAB — IRON AND TIBC
Iron: 40 ug/dL — ABNORMAL LOW (ref 45–182)
Saturation Ratios: 15 % — ABNORMAL LOW (ref 17.9–39.5)
TIBC: 262 ug/dL (ref 250–450)
UIBC: 222 ug/dL

## 2024-01-16 LAB — COMPREHENSIVE METABOLIC PANEL WITH GFR
ALT: 20 U/L (ref 0–44)
AST: 23 U/L (ref 15–41)
Albumin: 3.3 g/dL — ABNORMAL LOW (ref 3.5–5.0)
Alkaline Phosphatase: 94 U/L (ref 38–126)
Anion gap: 12 (ref 5–15)
BUN: 19 mg/dL (ref 8–23)
CO2: 27 mmol/L (ref 22–32)
Calcium: 8.7 mg/dL — ABNORMAL LOW (ref 8.9–10.3)
Chloride: 101 mmol/L (ref 98–111)
Creatinine, Ser: 1.3 mg/dL — ABNORMAL HIGH (ref 0.61–1.24)
GFR, Estimated: 56 mL/min — ABNORMAL LOW (ref >60)
Glucose, Bld: 85 mg/dL (ref 70–99)
Potassium: 3.9 mmol/L (ref 3.5–5.1)
Sodium: 140 mmol/L (ref 135–145)
Total Bilirubin: 0.7 mg/dL (ref 0.0–1.2)
Total Protein: 7 g/dL (ref 6.5–8.1)

## 2024-01-16 LAB — CBC WITH DIFFERENTIAL/PLATELET
Abs Immature Granulocytes: 0.04 K/uL (ref 0.00–0.07)
Basophils Absolute: 0.1 K/uL (ref 0.0–0.1)
Basophils Relative: 1 %
Eosinophils Absolute: 0.1 K/uL (ref 0.0–0.5)
Eosinophils Relative: 1 %
HCT: 31 % — ABNORMAL LOW (ref 39.0–52.0)
Hemoglobin: 9.7 g/dL — ABNORMAL LOW (ref 13.0–17.0)
Immature Granulocytes: 1 %
Lymphocytes Relative: 20 %
Lymphs Abs: 1.7 K/uL (ref 0.7–4.0)
MCH: 27.9 pg (ref 26.0–34.0)
MCHC: 31.3 g/dL (ref 30.0–36.0)
MCV: 89.1 fL (ref 80.0–100.0)
Monocytes Absolute: 0.6 K/uL (ref 0.1–1.0)
Monocytes Relative: 7 %
Neutro Abs: 5.8 K/uL (ref 1.7–7.7)
Neutrophils Relative %: 70 %
Platelets: 360 K/uL (ref 150–400)
RBC: 3.48 MIL/uL — ABNORMAL LOW (ref 4.22–5.81)
RDW: 15.5 % (ref 11.5–15.5)
WBC: 8.3 K/uL (ref 4.0–10.5)
nRBC: 0 % (ref 0.0–0.2)

## 2024-01-16 LAB — LIPID PANEL
Cholesterol: 109 mg/dL (ref 0–200)
HDL: 34 mg/dL — ABNORMAL LOW (ref >40)
LDL Cholesterol: 52 mg/dL (ref 0–99)
Total CHOL/HDL Ratio: 3.2 ratio
Triglycerides: 115 mg/dL (ref <150)
VLDL: 23 mg/dL (ref 0–40)

## 2024-01-16 LAB — HEMOGLOBIN A1C
Hgb A1c MFr Bld: 5.9 % — ABNORMAL HIGH (ref 4.8–5.6)
Mean Plasma Glucose: 122.63 mg/dL

## 2024-01-16 LAB — FERRITIN: Ferritin: 139 ng/mL (ref 24–336)

## 2024-01-18 DIAGNOSIS — R55 Syncope and collapse: Secondary | ICD-10-CM | POA: Diagnosis not present

## 2024-01-23 ENCOUNTER — Ambulatory Visit

## 2024-01-23 VITALS — BP 113/59 | HR 88 | Ht 68.0 in | Wt 176.2 lb

## 2024-01-23 DIAGNOSIS — N1832 Chronic kidney disease, stage 3b: Secondary | ICD-10-CM | POA: Diagnosis not present

## 2024-01-23 DIAGNOSIS — Z950 Presence of cardiac pacemaker: Secondary | ICD-10-CM | POA: Diagnosis not present

## 2024-01-23 DIAGNOSIS — I951 Orthostatic hypotension: Secondary | ICD-10-CM

## 2024-01-23 DIAGNOSIS — D638 Anemia in other chronic diseases classified elsewhere: Secondary | ICD-10-CM | POA: Diagnosis not present

## 2024-01-23 DIAGNOSIS — I2510 Atherosclerotic heart disease of native coronary artery without angina pectoris: Secondary | ICD-10-CM

## 2024-01-23 MED ORDER — MIDODRINE HCL 5 MG PO TABS: 10.0000 mg | ORAL_TABLET | Freq: Two times a day (BID) | ORAL | 0 refills | Status: DC

## 2024-01-23 NOTE — Progress Notes (Signed)
 Progress Note  Physician: Kamrin Sibley A Monicka Cyran, MD   HPI: Raymond Bennett. is a 78 y.o. male presenting on 01/23/2024 for Follow-up (Patient has a question about his medication.) .  Discussed the use of AI scribe software for clinical note transcription with the patient, who gave verbal consent to proceed.  History of Present Illness   Raymond Bennett. is an 78 year old male with coronary artery disease and anemia who presents for follow-up after an episode of syncope.  Syncope and cardiac evaluation - Experienced an episode of syncope at the beginning of October - Evaluated by cardiology following the event which required CPR and fracture of multiple ribs - Currently has a pacemaker in place - Follow-up with cardiology scheduled in six months - at ast visit etoprolol and Imdur have been discontinued from his regimen - Continues on losartan  and Crestor   Orthostatic hypotension and fatigue - Orthostatic hypotension with blood pressure dropping to the 90s upon standing - Currently taking midodrine  10 mg twice daily - dose reduced from previous - Engages in regular walking  Anemia and iron deficiency - Hemoglobin level of 9.7, unchanged since October - Recent laboratory studies show low total iron and decreased saturation with normal ferritin - ACD - Takes a multivitamin and iron supplements - Encouraged to increase protein intake  Lower extremity edema - Swelling present in the left leg; right leg appears normal - Elevates legs using a footstool - Has compression stockings but finds them difficult to apply  Rib fracture pain - Residual soreness from rib fractures sustained during CPR four weeks ago - Pain is improving but persists, especially with sneezing or coughing      Medical history:  Relevant past medical, surgical, family and social history reviewed and updated as indicated. Interim medical history since our last visit reviewed.  Allergies and  medications reviewed and updated.   ROS: Negative unless specifically indicated above in HPI.    Current Outpatient Medications:    aspirin  EC 81 MG tablet, Take 1 tablet (81 mg total) by mouth daily. Swallow whole., Disp: , Rfl:    Cholecalciferol  25 MCG (1000 UT) tablet, Take 1,000 Units by mouth daily., Disp: , Rfl:    clopidogrel  (PLAVIX ) 75 MG tablet, Take 1 tablet (75 mg total) by mouth daily., Disp: 30 tablet, Rfl: 0   feeding supplement (ENSURE PLUS HIGH PROTEIN) LIQD, Take 237 mLs by mouth 2 (two) times daily between meals., Disp: , Rfl:    ferrous sulfate  325 (65 FE) MG tablet, Take 325 mg by mouth in the morning., Disp: , Rfl:    finasteride  (PROPECIA ) 1 MG tablet, Take 1 tablet by mouth every morning., Disp: , Rfl:    magnesium  oxide (MAG-OX) 400 MG tablet, Take 400 mg by mouth daily., Disp: , Rfl:    Multiple Vitamins-Minerals (PRESERVISION AREDS 2) CAPS, Take 1 capsule by mouth 2 (two) times daily., Disp: , Rfl:    nitroGLYCERIN  (NITROSTAT ) 0.4 MG SL tablet, Place 0.4 mg under the tongue every 5 (five) minutes x 3 doses as needed for chest pain., Disp: , Rfl:    omeprazole (PRILOSEC) 40 MG capsule, Take 40 mg by mouth daily before breakfast. Reported on 08/06/2015, Disp: , Rfl:    polyethylene glycol powder (GLYCOLAX /MIRALAX ) 17 GM/SCOOP powder, Take 17 g by mouth daily as needed for severe constipation or moderate constipation. Dissolve 1 capful (17g) in 4-8 ounces of liquid and take by  mouth daily as needed., Disp: 1530 g, Rfl: 0   rosuvastatin  (CRESTOR ) 20 MG tablet, Take 20 mg by mouth daily., Disp: , Rfl:    vitamin B-12 (CYANOCOBALAMIN ) 1000 MCG tablet, Take 1,000 mcg by mouth in the morning., Disp: , Rfl:    midodrine  (PROAMATINE ) 5 MG tablet, Take 2 tablets (10 mg total) by mouth 2 (two) times daily with a meal., Disp: 270 tablet, Rfl: 0       Objective:     BP (!) 113/59   Pulse 88   Ht 5' 8 (1.727 m)   Wt 176 lb 3.2 oz (79.9 kg)   SpO2 95%   BMI 26.79 kg/m    Wt Readings from Last 3 Encounters:  01/23/24 176 lb 3.2 oz (79.9 kg)  01/09/24 180 lb (81.6 kg)  01/03/24 177 lb 0.5 oz (80.3 kg)    Physical Exam  Physical Exam Vitals reviewed.  Constitutional:      Appearance: Normal appearance. Well-developed with normal weight.  Cardiovascular:     Rate and Rhythm: Normal rate and regular rhythm. Normal heart sounds. Normal peripheral pulses Pulmonary:     Normal breath sounds with normal effort Skin:    General: Skin is warm and dry without noticeable rash. Neurological:     General: No focal deficit present.  Psychiatric:        Mood and Affect: Mood, behavior and cognition normal      Assessment & Plan:   Encounter Diagnoses  Name Primary?   Pacemaker Yes   Orthostatic hypotension    Anemia of chronic disease    Coronary artery disease involving native coronary artery of native heart without angina pectoris    Hypocalcemia    Stage 3b chronic kidney disease (HCC)     Orders Placed This Encounter  Procedures   PTH, intact and calcium    CBC with Differential/Platelet   Basic metabolic panel with GFR     Assessment and Plan    Chronic kidney disease, stage 3a Kidney function stable with GFR 56, creatinine 1.3. Hypocalcemia improved to 8.7. - Continue current management and monitoring of kidney function.  - Stay hydrated, avoid NSAID  Anemia of chronic disease Anemia stable with hemoglobin 9.7. Labs indicate anemia of chronic disease. - Continue current management and monitoring of anemia. - Recheck labs before Christmas.  Orthostatic hypotension Orthostatic hypotension noted, possibly exacerbated by Jardiance . - Discontinued Jardiance  to assess impact on orthostatic hypotension. - Monitor blood pressure and symptoms. - Consider increasing midodrine  if symptoms persist.  Type 2 diabetes mellitus, well controlled Diabetes well controlled with A1c 5.9. Jardiance  discontinued due to orthostatic hypotension  concerns. - Monitor blood glucose levels post-Jardiance  discontinuation. - Reassess diabetes management.  Coronary artery disease, stable - Follow up with cardiology in six months. Pacemaker functioning well.  Unilateral left lower extremity edema Experiences unilateral left lower extremity edema. - Continue using compression stockings and leg elevation. - Consider diabetic socks or one-way compression stockings.  - Focus on maintaining a healthy diet and physical activity.

## 2024-01-24 DIAGNOSIS — I5020 Unspecified systolic (congestive) heart failure: Secondary | ICD-10-CM | POA: Diagnosis not present

## 2024-01-24 DIAGNOSIS — I428 Other cardiomyopathies: Secondary | ICD-10-CM | POA: Diagnosis not present

## 2024-01-24 DIAGNOSIS — Z955 Presence of coronary angioplasty implant and graft: Secondary | ICD-10-CM | POA: Diagnosis not present

## 2024-01-24 DIAGNOSIS — Z8679 Personal history of other diseases of the circulatory system: Secondary | ICD-10-CM | POA: Diagnosis not present

## 2024-02-13 ENCOUNTER — Other Ambulatory Visit: Admission: RE | Admit: 2024-02-13 | Discharge: 2024-02-13 | Disposition: A | Source: Ambulatory Visit

## 2024-02-13 LAB — BASIC METABOLIC PANEL WITH GFR
Anion gap: 13 (ref 5–15)
BUN: 22 mg/dL (ref 8–23)
CO2: 24 mmol/L (ref 22–32)
Calcium: 9.8 mg/dL (ref 8.9–10.3)
Chloride: 101 mmol/L (ref 98–111)
Creatinine, Ser: 1.39 mg/dL — ABNORMAL HIGH (ref 0.61–1.24)
GFR, Estimated: 51 mL/min — ABNORMAL LOW (ref >60)
Glucose, Bld: 92 mg/dL (ref 70–99)
Potassium: 4.1 mmol/L (ref 3.5–5.1)
Sodium: 137 mmol/L (ref 135–145)

## 2024-02-15 ENCOUNTER — Other Ambulatory Visit (HOSPITAL_COMMUNITY): Payer: Self-pay

## 2024-02-20 ENCOUNTER — Ambulatory Visit

## 2024-02-20 VITALS — BP 110/60 | HR 82 | Ht 68.0 in | Wt 176.0 lb

## 2024-02-20 DIAGNOSIS — E611 Iron deficiency: Secondary | ICD-10-CM

## 2024-02-20 DIAGNOSIS — Z85528 Personal history of other malignant neoplasm of kidney: Secondary | ICD-10-CM | POA: Diagnosis not present

## 2024-02-20 DIAGNOSIS — N1832 Chronic kidney disease, stage 3b: Secondary | ICD-10-CM

## 2024-02-20 DIAGNOSIS — D631 Anemia in chronic kidney disease: Secondary | ICD-10-CM | POA: Diagnosis not present

## 2024-02-20 DIAGNOSIS — I429 Cardiomyopathy, unspecified: Secondary | ICD-10-CM | POA: Diagnosis not present

## 2024-02-20 MED ORDER — MIDODRINE HCL 5 MG PO TABS: 10.0000 mg | ORAL_TABLET | Freq: Every day | ORAL | 0 refills | Status: AC

## 2024-02-20 NOTE — Progress Notes (Signed)
 Progress Note  Physician: Genieve Ramaswamy A Rashawnda Gaba, MD   HPI: Raymond Bennett. is a 78 y.o. male presenting on 02/20/2024 for Follow-up .  Discussed the use of AI scribe software for clinical note transcription with the patient, who gave verbal consent to proceed.  History of Present Illness   Raymond Bennett. is an 78 year old male who presents for ongoing pain management and follow-up for fractured ribs.  Right-sided rib fracture pain - Soreness localized to the right second, third, fourth, and fifth ribs, both anteriorly and posteriorly at the level of the shoulder blade - Pain is improving; able to sneeze without significant discomfort - Persistent pain in one spot on the back and along the right side which is also improving  Cardiomyopathy and blood pressure management - History of cardiomyopathy; recently evaluated by cardiology - Currently taking midodrine  for hypotension, dose adjusted from three tablets three times daily to two tablets once daily - Home blood pressure readings stable at approximately 110/60 mmHg.  He does have fluctuations up to 150/160 systolic transiently - No dizziness or lightheadedness - Maintaining adequate hydration  Anemia and chronic kidney disease - History of anemia and chronic kidney disease under ongoing monitoring - No shortness of breath - No weight changes - No issues with blood glucose  Fatigue - Experiences increased fatigue with activities, such as antiquing  Skin integrity - History of skin tears and easy bruising on asa/clopidogrel  - Recent episode of bleeding after minor trauma       Medical history:  Relevant past medical, surgical, family and social history reviewed and updated as indicated. Interim medical history since our last visit reviewed.  Allergies and medications reviewed and updated.   ROS: Negative unless specifically indicated above in HPI.   Current Medications[1]       Objective:     BP  110/60   Pulse 82   Ht 5' 8 (1.727 m)   Wt 176 lb (79.8 kg)   SpO2 (!) 83%   BMI 26.76 kg/m   Wt Readings from Last 3 Encounters:  02/20/24 176 lb (79.8 kg)  01/23/24 176 lb 3.2 oz (79.9 kg)  01/09/24 180 lb (81.6 kg)    Physical Exam  Physical Exam Vitals reviewed.  Constitutional:      Appearance: Normal appearance. Well-developed with normal weight.  Cardiovascular:     Rate and Rhythm: Normal rate and regular rhythm. Normal heart sounds. Normal peripheral pulses Pulmonary:     Normal breath sounds with normal effort Skin:    General: Skin is warm and dry without noticeable rash. Neurological:     General: No focal deficit present.  Psychiatric:        Mood and Affect: Mood, behavior and cognition normal      Assessment & Plan:   Encounter Diagnoses  Name Primary?   History of renal cell carcinoma     No orders of the defined types were placed in this encounter.    Assessment and Plan    Multiple right rib fractures Fractures of right ribs 2-5 with improving pain. Healing may be prolonged due to age. - Continue current management as pain is improving.  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 Blood pressure stable with midodrine . Cardiologist continued empagliflozin . - Continue empagliflozin  as per cardiologist's recommendation. - Monitor blood pressure at home, adjust midodrine  if consistently above 130s. - Use compression  stockings if swelling increases.  Chronic kidney disease Well-managed with stable kidney function. Calcium  levels normalized. - Recheck kidney function in March.                  [1]  Current Outpatient Medications:    aspirin  EC 81 MG tablet, Take 1 tablet (81 mg total) by mouth daily. Swallow whole., Disp: , Rfl:    Cholecalciferol  25 MCG (1000 UT) tablet, Take 1,000 Units by mouth daily., Disp: , Rfl:    clopidogrel  (PLAVIX ) 75 MG tablet, Take 1 tablet (75 mg  total) by mouth daily., Disp: 30 tablet, Rfl: 0   feeding supplement (ENSURE PLUS HIGH PROTEIN) LIQD, Take 237 mLs by mouth 2 (two) times daily between meals., Disp: , Rfl:    ferrous sulfate  325 (65 FE) MG tablet, Take 325 mg by mouth in the morning., Disp: , Rfl:    finasteride  (PROPECIA ) 1 MG tablet, Take 1 tablet by mouth every morning., Disp: , Rfl:    magnesium  oxide (MAG-OX) 400 MG tablet, Take 400 mg by mouth daily., Disp: , Rfl:    Multiple Vitamins-Minerals (PRESERVISION AREDS 2) CAPS, Take 1 capsule by mouth 2 (two) times daily., Disp: , Rfl:    nitroGLYCERIN  (NITROSTAT ) 0.4 MG SL tablet, Place 0.4 mg under the tongue every 5 (five) minutes x 3 doses as needed for chest pain., Disp: , Rfl:    omeprazole (PRILOSEC) 40 MG capsule, Take 40 mg by mouth daily before breakfast. Reported on 08/06/2015, Disp: , Rfl:    polyethylene glycol powder (GLYCOLAX /MIRALAX ) 17 GM/SCOOP powder, Take 17 g by mouth daily as needed for severe constipation or moderate constipation. Dissolve 1 capful (17g) in 4-8 ounces of liquid and take by mouth daily as needed., Disp: 1530 g, Rfl: 0   rosuvastatin  (CRESTOR ) 20 MG tablet, Take 20 mg by mouth daily., Disp: , Rfl:    vitamin B-12 (CYANOCOBALAMIN ) 1000 MCG tablet, Take 1,000 mcg by mouth in the morning., Disp: , Rfl:    midodrine  (PROAMATINE ) 5 MG tablet, Take 2 tablets (10 mg total) by mouth daily., Disp: 270 tablet, Rfl: 0

## 2024-02-28 ENCOUNTER — Other Ambulatory Visit: Payer: Self-pay | Admitting: Internal Medicine

## 2024-02-28 DIAGNOSIS — D638 Anemia in other chronic diseases classified elsewhere: Secondary | ICD-10-CM

## 2024-02-28 DIAGNOSIS — R739 Hyperglycemia, unspecified: Secondary | ICD-10-CM

## 2024-03-28 ENCOUNTER — Ambulatory Visit: Payer: Medicare PPO | Admitting: Dermatology

## 2024-03-28 ENCOUNTER — Encounter: Payer: Self-pay | Admitting: Dermatology

## 2024-03-28 DIAGNOSIS — W908XXA Exposure to other nonionizing radiation, initial encounter: Secondary | ICD-10-CM | POA: Diagnosis not present

## 2024-03-28 DIAGNOSIS — Z85828 Personal history of other malignant neoplasm of skin: Secondary | ICD-10-CM

## 2024-03-28 DIAGNOSIS — D1801 Hemangioma of skin and subcutaneous tissue: Secondary | ICD-10-CM

## 2024-03-28 DIAGNOSIS — L814 Other melanin hyperpigmentation: Secondary | ICD-10-CM

## 2024-03-28 DIAGNOSIS — Z79899 Other long term (current) drug therapy: Secondary | ICD-10-CM | POA: Diagnosis not present

## 2024-03-28 DIAGNOSIS — L820 Inflamed seborrheic keratosis: Secondary | ICD-10-CM

## 2024-03-28 DIAGNOSIS — Z1283 Encounter for screening for malignant neoplasm of skin: Secondary | ICD-10-CM

## 2024-03-28 DIAGNOSIS — L578 Other skin changes due to chronic exposure to nonionizing radiation: Secondary | ICD-10-CM

## 2024-03-28 DIAGNOSIS — D229 Melanocytic nevi, unspecified: Secondary | ICD-10-CM

## 2024-03-28 DIAGNOSIS — L649 Androgenic alopecia, unspecified: Secondary | ICD-10-CM

## 2024-03-28 DIAGNOSIS — Z7189 Other specified counseling: Secondary | ICD-10-CM

## 2024-03-28 DIAGNOSIS — L570 Actinic keratosis: Secondary | ICD-10-CM | POA: Diagnosis not present

## 2024-03-28 DIAGNOSIS — L821 Other seborrheic keratosis: Secondary | ICD-10-CM | POA: Diagnosis not present

## 2024-03-28 DIAGNOSIS — L80 Vitiligo: Secondary | ICD-10-CM | POA: Diagnosis not present

## 2024-03-28 MED ORDER — FINASTERIDE 1 MG PO TABS: 1.0000 mg | ORAL_TABLET | Freq: Every morning | ORAL | 11 refills | Status: AC

## 2024-03-28 NOTE — Progress Notes (Signed)
 "  Follow-Up Visit   Subjective  Raymond Bennett. is a 79 y.o. 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 The patient has spots, moles and lesions to be evaluated, some may be new or changing and the patient may have concern these could be cancer.  The following portions of the chart were reviewed this encounter and updated as appropriate: medications, allergies, medical history  Review of Systems:  No other skin or systemic complaints except as noted in HPI or Assessment and Plan.  Objective  Well appearing patient in no apparent distress; mood and affect are within normal limits.  A full examination was performed including scalp, head, eyes, ears, nose, lips, neck, chest, axillae, abdomen, back, buttocks, bilateral upper extremities, bilateral lower extremities, hands, feet, fingers, toes, fingernails, and toenails. All findings within normal limits unless otherwise noted below.   Relevant physical exam findings are noted in the Assessment and Plan. nose supratip x 1 Erythematous thin papules/macules with gritty scale.  See photo   left medial ankle x 1, left earlobe x 1 Erythematous stuck-on, waxy papule or plaque  See photo left earlobe    Assessment & Plan   HISTORY OF BASAL CELL CARCINOMA OF THE SKIN 10/20/2022 right lateral neck trx ED&C   02/14/2020 left thigh above knee  08/15/2019 - left chest parasternal - superficial  and right chest medial infraclavicular superficial   02/06/2019 left scalp/temple area within the hair above the sideburn - excised   11/17/2017 right mid ear helix   01/01/2016 left mid back  01/17/2014 left lateral top of shoulder anterior , left anterior top of shoulder posterior   - No evidence of recurrence today - Recommend  regular full body skin exams - Recommend daily broad spectrum sunscreen SPF 30+ to sun-exposed areas, reapply every 2 hours as needed.  - Call if any new or changing lesions are noted between office visits  ANDROGENETIC ALOPECIA (MALE PATTERN HAIR LOSS) Exam: Frontal scalp thinning with intact frontal hairline and miniaturization  Chronic condition with duration or expected duration over one year. Currently well-controlled. Androgenetic Alopecia (or Male pattern hair loss) refers to the common patterned hair loss affecting many men.  Male pattern alopecia is mediated by dihydrotestosterone which induces miniaturization of androgen-sensitive hair follicles.  It is chronic and persistent, but treatable; not curable. Topical treatment includes: - 5% topical Minoxidil Oral treatment includes: - Finasteride  1 mg qd - Minoxidil 1.25 - 5 mg qd - Dutasteride 0.5 mg qd Adjunct therapy includes: - Low Level Laser Light Therapy (LLLT) - Platelet-rich Plasma injections (PRP) - Hair Transplantation or scalp reduction Denies depression or moodiness etc.  Treatment Plan: Continue Finasteride  1 mg po QD.  Long term medication management.  Patient is using long term (months to years) prescription medication  to control their dermatologic condition.  These medications require periodic monitoring to evaluate for efficacy and side effects and may require periodic laboratory monitoring.    SKIN CANCER SCREENING PERFORMED TODAY.  ACTINIC DAMAGE - Chronic condition, secondary to cumulative UV/sun exposure - diffuse scaly erythematous macules with underlying dyspigmentation - Recommend daily broad spectrum sunscreen SPF 30+ to sun-exposed areas, reapply every 2 hours as needed.  - Staying in the shade or wearing long sleeves, sun  glasses (UVA+UVB protection) and wide brim hats (4-inch brim around the entire circumference of the hat) are also recommended for sun protection.  - Call for new or changing  lesions.  LENTIGINES, SEBORRHEIC KERATOSES, HEMANGIOMAS - Benign normal skin lesions - Benign-appearing - Call for any changes  MELANOCYTIC NEVI - Tan-brown and/or pink-flesh-colored symmetric macules and papules - Benign appearing on exam today - Observation - Call clinic for new or changing moles - Recommend daily use of broad spectrum spf 30+ sunscreen to sun-exposed areas.   VITILIGO Exam: depigmented patches on trunk and exts Vitiligo is a chronic autoimmune condition which causes loss of skin pigment and is commonly seen on the face and may also involve areas of trauma like hands, elbows, knees, and ankles. There is no cure and it is difficult to treat.  Treatments include topical steroids and other topical anti-inflammatory ointments/creams and topical and oral Jak inhibitors.  Sometimes narrow band UV light therapy or Xtrac laser is helpful, both of which require twice weekly treatments for at least 3-6 months.  Antioxidant vitamins, such as Vitamins A,C,E,D, Folic Acid and B12 may be added to enhance treatment. Heliocare may also enhance treatment results. Treatment Plan: No recommended treatment - pt declines tx  ACTINIC KERATOSIS nose supratip x 1  Actinic keratoses are precancerous spots that appear secondary to cumulative UV radiation exposure/sun exposure over time. They are chronic with expected duration over 1 year. A portion of actinic keratoses will progress to squamous cell carcinoma of the skin. It is not possible to reliably predict which spots will progress to skin cancer and so treatment is recommended to prevent development of skin cancer.  Recommend daily broad spectrum sunscreen SPF 30+ to sun-exposed areas, reapply every 2 hours as needed.  Recommend staying in the shade or wearing long sleeves, sun glasses (UVA+UVB protection) and wide brim hats (4-inch brim around the entire circumference of the hat). Call for new or changing lesions. - Destruction of lesion -  nose supratip x 1 Complexity: simple   Destruction method: cryotherapy   Informed consent: discussed and consent obtained   Timeout:  patient name, date of birth, surgical site, and procedure verified Lesion destroyed using liquid nitrogen: Yes   Region frozen until ice ball extended beyond lesion: Yes   Outcome: patient tolerated procedure well with no complications   Post-procedure details: wound care instructions given    INFLAMED SEBORRHEIC KERATOSIS left medial ankle x 1, left earlobe x 1 Symptomatic, irritating, patient would like treated.  Discussed will recheck left earlobe in 4 months but if not resolved in 2 months patient advised to call or send mychart  - Destruction of lesion - left medial ankle x 1, left earlobe x 1 Complexity: simple   Destruction method: cryotherapy   Informed consent: discussed and consent obtained   Timeout:  patient name, date of birth, surgical site, and procedure verified Lesion destroyed using liquid nitrogen: Yes   Region frozen until ice ball extended beyond lesion: Yes   Outcome: patient tolerated procedure well with no complications   Post-procedure details: wound care instructions given    ANDROGENETIC ALOPECIA   This Visit - finasteride  (PROPECIA ) 1 MG tablet - Take 1 tablet (1 mg total) by mouth every morning. Return for 4 month recheck isk at left earlobe , 1 year tbse .  Raymond Bennett, CMA, am acting as scribe for Alm Rhyme, MD.   Documentation: I have reviewed the above documentation for accuracy and completeness, and I agree  with the above.  Alm Rhyme, MD    "

## 2024-03-28 NOTE — Patient Instructions (Addendum)
 Actinic keratoses are precancerous spots that appear secondary to cumulative UV radiation exposure/sun exposure over time. They are chronic with expected duration over 1 year. A portion of actinic keratoses will progress to squamous cell carcinoma of the skin. It is not possible to reliably predict which spots will progress to skin cancer and so treatment is recommended to prevent development of skin cancer.  Recommend daily broad spectrum sunscreen SPF 30+ to sun-exposed areas, reapply every 2 hours as needed.  Recommend staying in the shade or wearing long sleeves, sun glasses (UVA+UVB protection) and wide brim hats (4-inch brim around the entire circumference of the hat). Call for new or changing lesions.     Cryotherapy Aftercare  Wash gently with soap and water everyday.   Apply Vaseline and Band-Aid daily until healed.    Seborrheic Keratosis  What causes seborrheic keratoses? Seborrheic keratoses are harmless, common skin growths that first appear during adult life.  As time goes by, more growths appear.  Some people may develop a large number of them.  Seborrheic keratoses appear on both covered and uncovered body parts.  They are not caused by sunlight.  The tendency to develop seborrheic keratoses can be inherited.  They vary in color from skin-colored to gray, brown, or even black.  They can be either smooth or have a rough, warty surface.   Seborrheic keratoses are superficial and look as if they were stuck on the skin.  Under the microscope this type of keratosis looks like layers upon layers of skin.  That is why at times the top layer may seem to fall off, but the rest of the growth remains and re-grows.    Treatment Seborrheic keratoses do not need to be treated, but can easily be removed in the office.  Seborrheic keratoses often cause symptoms when they rub on clothing or jewelry.  Lesions can be in the way of shaving.  If they become inflamed, they can cause itching, soreness,  or burning.  Removal of a seborrheic keratosis can be accomplished by freezing, burning, or surgery. If any spot bleeds, scabs, or grows rapidly, please return to have it checked, as these can be an indication of a skin cancer.   Melanoma ABCDEs  Melanoma is the most dangerous type of skin cancer, and is the leading cause of death from skin disease.  You are more likely to develop melanoma if you: Have light-colored skin, light-colored eyes, or red or blond hair Spend a lot of time in the sun Tan regularly, either outdoors or in a tanning bed Have had blistering sunburns, especially during childhood Have a close family member who has had a melanoma Have atypical moles or large birthmarks  Early detection of melanoma is key since treatment is typically straightforward and cure rates are extremely high if we catch it early.   The first sign of melanoma is often a change in a mole or a new dark spot.  The ABCDE system is a way of remembering the signs of melanoma.  A for asymmetry:  The two halves do not match. B for border:  The edges of the growth are irregular. C for color:  A mixture of colors are present instead of an even brown color. D for diameter:  Melanomas are usually (but not always) greater than 6mm - the size of a pencil eraser. E for evolution:  The spot keeps changing in size, shape, and color.  Please check your skin once per month between visits. You can  use a small mirror in front and a large mirror behind you to keep an eye on the back side or your body.   If you see any new or changing lesions before your next follow-up, please call to schedule a visit.  Please continue daily skin protection including broad spectrum sunscreen SPF 30+ to sun-exposed areas, reapplying every 2 hours as needed when you're outdoors.   Staying in the shade or wearing long sleeves, sun glasses (UVA+UVB protection) and wide brim hats (4-inch brim around the entire circumference of the hat) are  also recommended for sun protection.    Due to recent changes in healthcare laws, you may see results of your pathology and/or laboratory studies on MyChart before the doctors have had a chance to review them. We understand that in some cases there may be results that are confusing or concerning to you. Please understand that not all results are received at the same time and often the doctors may need to interpret multiple results in order to provide you with the best plan of care or course of treatment. Therefore, we ask that you please give us  2 business days to thoroughly review all your results before contacting the office for clarification. Should we see a critical lab result, you will be contacted sooner.   If You Need Anything After Your Visit  If you have any questions or concerns for your doctor, please call our main line at [phone] and press option 4 to reach your doctor's medical assistant. If no one answers, please leave a voicemail as directed and we will return your call as soon as possible. Messages left after 4 pm will be answered the following business day.   You may also send us  a message via MyChart. We typically respond to MyChart messages within 1-2 business days.  For prescription refills, please ask your pharmacy to contact our office. Our fax number is [phone].  If you have an urgent issue when the clinic is closed that cannot wait until the next business day, you can page your doctor at the number below.    Please note that while we do our best to be available for urgent issues outside of office hours, we are not available 24/7.   If you have an urgent issue and are unable to reach us , you may choose to seek medical care at your doctor's office, retail clinic, urgent care center, or emergency room.  If you have a medical emergency, please immediately call 911 or go to the emergency department.  Pager Numbers  - Dr. Hester: [phone]  - Dr. Jackquline:  [phone]  - Dr. Claudene: [phone]   - Dr. Raymund: [phone]  In the event of inclement weather, please call our main line at [phone] for an update on the status of any delays or closures.  Dermatology Medication Tips: Please keep the boxes that topical medications come in in order to help keep track of the instructions about where and how to use these. Pharmacies typically print the medication instructions only on the boxes and not directly on the medication tubes.   If your medication is too expensive, please contact our office at [phone] option 4 or send us  a message through MyChart.   We are unable to tell what your co-pay for medications will be in advance as this is different depending on your insurance coverage. However, we may be able to find a substitute medication at lower cost or fill out paperwork to get insurance to cover  a needed medication.   If a prior authorization is required to get your medication covered by your insurance company, please allow us  1-2 business days to complete this process.  Drug prices often vary depending on where the prescription is filled and some pharmacies may offer cheaper prices.  The website www.goodrx.com contains coupons for medications through different pharmacies. The prices here do not account for what the cost may be with help from insurance (it may be cheaper with your insurance), but the website can give you the price if you did not use any insurance.  - You can print the associated coupon and take it with your prescription to the pharmacy.  - You may also stop by our office during regular business hours and pick up a GoodRx coupon card.  - If you need your prescription sent electronically to a different pharmacy, notify our office through [facility] or by phone at [phone] option 4.     Si Usted Necesita Algo Despus de Su Visita  Tambin puede enviarnos un mensaje a travs de Clinical cytogeneticist. Por lo general  respondemos a los mensajes de MyChart en el transcurso de 1 a 2 das hbiles.  Para renovar recetas, por favor pida a su farmacia que se ponga en contacto con nuestra oficina. Randi lakes de fax es [place] [phone].  Si tiene un asunto urgente cuando la clnica est cerrada y que no puede esperar hasta el siguiente da hbil, puede llamar/localizar a su doctor(a) al nmero que aparece a continuacin.   Por favor, tenga en cuenta que aunque hacemos todo lo posible para estar disponibles para asuntos urgentes fuera del horario de [place], no estamos disponibles las 24 horas del da, los 7 [address]  [address] de la [place].   Si tiene un problema urgente y no puede comunicarse con nosotros, puede optar por buscar atencin mdica  en el consultorio de su doctor(a), en una clnica privada, en un centro de atencin urgente o en una sala de emergencias.  Si tiene Engineer, drilling, por favor llame inmediatamente al 911 o vaya a la sala de emergencias.  Nmeros de bper  - Dr. Hester: [phone]  - Dra. Jackquline: [phone]  - Dr. Claudene: [phone]  - Dra. Kitts: [phone]  En caso de inclemencias del [place], por favor llame a nuestra lnea principal al [phone] para una actualizacin sobre el estado de cualquier retraso o cierre.  Consejos para la medicacin en dermatologa: Por favor, guarde las cajas en las que vienen los medicamentos de uso tpico para ayudarle a seguir las instrucciones sobre dnde y cmo usarlos. Las farmacias generalmente imprimen las instrucciones del medicamento slo en las cajas y no directamente en los tubos del [place].   Si su medicamento es muy caro, por favor, pngase en contacto con landry rieger llamando al [phone] y presione la opcin 4 o envenos un mensaje a travs de Clinical cytogeneticist.   No podemos decirle cul ser su copago por los medicamentos por adelantado ya que esto es diferente dependiendo de la cobertura de su seguro. Sin embargo, es posible  que podamos encontrar un medicamento sustituto a Audiological scientist un formulario para que el seguro cubra el medicamento que se considera necesario.   Si se requiere una autorizacin previa para que su compaa de seguros malta su medicamento, por favor permtanos de 1 a 2 das hbiles para completar este proceso.  Los precios de los medicamentos varan con frecuencia dependiendo del Environmental consultant de dnde se surte la receta y iraq  pueden ofrecer precios ms baratos.  El sitio web www.goodrx.com tiene cupones para medicamentos de Health and safety inspector. Los precios aqu no tienen en cuenta lo que podra costar con la ayuda del seguro (puede ser ms barato con su seguro), pero el sitio web puede darle el precio si no utiliz Tourist information centre manager.  - Puede imprimir el cupn correspondiente y llevarlo con su receta a la farmacia.  - Tambin puede pasar por nuestra oficina durante el horario de atencin regular y Education officer, museum una tarjeta de cupones de GoodRx.  - Si necesita que su receta se enve electrnicamente a una farmacia diferente, informe a nuestra oficina a travs de MyChart de [employer] o por telfono llamando al [phone] y presione la opcin 4.

## 2024-05-21 ENCOUNTER — Ambulatory Visit

## 2024-07-19 ENCOUNTER — Ambulatory Visit: Admitting: Dermatology

## 2025-04-03 ENCOUNTER — Ambulatory Visit: Admitting: Dermatology
